# Patient Record
Sex: Female | Born: 1975 | Race: Black or African American | Hispanic: No | Marital: Single | Smoking: Current some day smoker
Health system: Southern US, Community
[De-identification: ages and names within clinical notes are randomized; demographics above are authoritative.]

## PROBLEM LIST (undated history)

## (undated) ENCOUNTER — Inpatient Hospital Stay (HOSPITAL_COMMUNITY): Payer: Self-pay

## (undated) DIAGNOSIS — K219 Gastro-esophageal reflux disease without esophagitis: Secondary | ICD-10-CM

## (undated) DIAGNOSIS — O139 Gestational [pregnancy-induced] hypertension without significant proteinuria, unspecified trimester: Secondary | ICD-10-CM

## (undated) DIAGNOSIS — I10 Essential (primary) hypertension: Secondary | ICD-10-CM

## (undated) DIAGNOSIS — M5430 Sciatica, unspecified side: Secondary | ICD-10-CM

## (undated) DIAGNOSIS — A599 Trichomoniasis, unspecified: Secondary | ICD-10-CM

## (undated) DIAGNOSIS — F419 Anxiety disorder, unspecified: Secondary | ICD-10-CM

## (undated) DIAGNOSIS — F1911 Other psychoactive substance abuse, in remission: Secondary | ICD-10-CM

---

## 1999-11-28 ENCOUNTER — Encounter: Payer: Self-pay | Admitting: *Deleted

## 1999-11-28 ENCOUNTER — Inpatient Hospital Stay (HOSPITAL_COMMUNITY): Admission: AD | Admit: 1999-11-28 | Discharge: 1999-11-28 | Payer: Self-pay | Admitting: *Deleted

## 1999-12-08 ENCOUNTER — Emergency Department (HOSPITAL_COMMUNITY): Admission: EM | Admit: 1999-12-08 | Discharge: 1999-12-08 | Payer: Self-pay | Admitting: Emergency Medicine

## 2000-01-17 ENCOUNTER — Inpatient Hospital Stay (HOSPITAL_COMMUNITY): Admission: AD | Admit: 2000-01-17 | Discharge: 2000-01-19 | Payer: Self-pay | Admitting: *Deleted

## 2000-05-02 ENCOUNTER — Emergency Department (HOSPITAL_COMMUNITY): Admission: EM | Admit: 2000-05-02 | Discharge: 2000-05-02 | Payer: Self-pay | Admitting: Emergency Medicine

## 2000-06-16 ENCOUNTER — Other Ambulatory Visit: Admission: RE | Admit: 2000-06-16 | Discharge: 2000-06-16 | Payer: Self-pay | Admitting: *Deleted

## 2000-06-17 ENCOUNTER — Emergency Department (HOSPITAL_COMMUNITY): Admission: EM | Admit: 2000-06-17 | Discharge: 2000-06-17 | Payer: Self-pay | Admitting: Emergency Medicine

## 2000-06-25 ENCOUNTER — Other Ambulatory Visit: Admission: RE | Admit: 2000-06-25 | Discharge: 2000-06-25 | Payer: Self-pay | Admitting: Obstetrics

## 2004-08-24 ENCOUNTER — Inpatient Hospital Stay (HOSPITAL_COMMUNITY): Admission: AD | Admit: 2004-08-24 | Discharge: 2004-08-24 | Payer: Self-pay | Admitting: Obstetrics and Gynecology

## 2004-08-26 ENCOUNTER — Inpatient Hospital Stay (HOSPITAL_COMMUNITY): Admission: AD | Admit: 2004-08-26 | Discharge: 2004-08-26 | Payer: Self-pay | Admitting: Obstetrics and Gynecology

## 2005-02-05 ENCOUNTER — Emergency Department (HOSPITAL_COMMUNITY): Admission: EM | Admit: 2005-02-05 | Discharge: 2005-02-05 | Payer: Self-pay | Admitting: Emergency Medicine

## 2006-02-05 ENCOUNTER — Emergency Department (HOSPITAL_COMMUNITY): Admission: EM | Admit: 2006-02-05 | Discharge: 2006-02-05 | Payer: Self-pay | Admitting: Emergency Medicine

## 2006-06-15 ENCOUNTER — Emergency Department (HOSPITAL_COMMUNITY): Admission: EM | Admit: 2006-06-15 | Discharge: 2006-06-15 | Payer: Self-pay | Admitting: Emergency Medicine

## 2006-06-21 ENCOUNTER — Emergency Department (HOSPITAL_COMMUNITY): Admission: EM | Admit: 2006-06-21 | Discharge: 2006-06-21 | Payer: Self-pay | Admitting: Emergency Medicine

## 2007-08-06 ENCOUNTER — Emergency Department (HOSPITAL_COMMUNITY): Admission: EM | Admit: 2007-08-06 | Discharge: 2007-08-06 | Payer: Self-pay | Admitting: Emergency Medicine

## 2007-12-30 ENCOUNTER — Emergency Department (HOSPITAL_COMMUNITY): Admission: EM | Admit: 2007-12-30 | Discharge: 2007-12-30 | Payer: Self-pay | Admitting: Emergency Medicine

## 2009-06-26 ENCOUNTER — Emergency Department (HOSPITAL_COMMUNITY): Admission: EM | Admit: 2009-06-26 | Discharge: 2009-06-26 | Payer: Self-pay | Admitting: Emergency Medicine

## 2010-08-25 LAB — URINALYSIS, ROUTINE W REFLEX MICROSCOPIC
Bilirubin Urine: NEGATIVE
Glucose, UA: NEGATIVE mg/dL
Hgb urine dipstick: NEGATIVE
Ketones, ur: NEGATIVE mg/dL
Nitrite: NEGATIVE
Protein, ur: NEGATIVE mg/dL
Specific Gravity, Urine: 1.02 (ref 1.005–1.030)
Urobilinogen, UA: 0.2 mg/dL (ref 0.0–1.0)
pH: 7 (ref 5.0–8.0)

## 2010-08-25 LAB — CBC
MCHC: 33.6 g/dL (ref 30.0–36.0)
MCV: 85.7 fL (ref 78.0–100.0)
Platelets: 176 10*3/uL (ref 150–400)
RBC: 3.98 MIL/uL (ref 3.87–5.11)
RDW: 15.9 % — ABNORMAL HIGH (ref 11.5–15.5)

## 2010-08-25 LAB — POCT I-STAT, CHEM 8
Glucose, Bld: 87 mg/dL (ref 70–99)
HCT: 36 % (ref 36.0–46.0)
Hemoglobin: 12.2 g/dL (ref 12.0–15.0)
Potassium: 4.2 mEq/L (ref 3.5–5.1)

## 2010-08-25 LAB — DIFFERENTIAL
Basophils Absolute: 0 10*3/uL (ref 0.0–0.1)
Basophils Relative: 1 % (ref 0–1)
Eosinophils Absolute: 0 10*3/uL (ref 0.0–0.7)
Monocytes Relative: 14 % — ABNORMAL HIGH (ref 3–12)
Neutrophils Relative %: 67 % (ref 43–77)

## 2011-02-28 LAB — GC/CHLAMYDIA PROBE AMP, GENITAL: Chlamydia, DNA Probe: NEGATIVE

## 2011-02-28 LAB — WET PREP, GENITAL: Clue Cells Wet Prep HPF POC: NONE SEEN

## 2011-06-18 ENCOUNTER — Encounter (HOSPITAL_COMMUNITY): Payer: Self-pay | Admitting: Emergency Medicine

## 2011-06-18 ENCOUNTER — Emergency Department (HOSPITAL_COMMUNITY)
Admission: EM | Admit: 2011-06-18 | Discharge: 2011-06-18 | Disposition: A | Discharge status: Home / Self Care | Payer: Self-pay | Attending: Emergency Medicine | Admitting: Emergency Medicine

## 2011-06-18 DIAGNOSIS — M545 Low back pain, unspecified: Secondary | ICD-10-CM | POA: Insufficient documentation

## 2011-06-18 DIAGNOSIS — M25559 Pain in unspecified hip: Secondary | ICD-10-CM | POA: Insufficient documentation

## 2011-06-18 DIAGNOSIS — X500XXA Overexertion from strenuous movement or load, initial encounter: Secondary | ICD-10-CM | POA: Insufficient documentation

## 2011-06-18 DIAGNOSIS — M25551 Pain in right hip: Secondary | ICD-10-CM

## 2011-06-18 MED ORDER — OXYCODONE-ACETAMINOPHEN 5-325 MG PO TABS: 2.0000 | ORAL_TABLET | ORAL | Status: DC | PRN

## 2011-06-18 MED ORDER — KETOROLAC TROMETHAMINE 60 MG/2ML IM SOLN
60.0000 mg | Freq: Once | INTRAMUSCULAR | Status: AC
  Administered 2011-06-18: 60 mg via INTRAMUSCULAR
  Filled 2011-06-18: qty 2

## 2011-06-18 MED ORDER — OXYCODONE-ACETAMINOPHEN 5-325 MG PO TABS
1.0000 | ORAL_TABLET | Freq: Once | ORAL | Status: AC
  Administered 2011-06-18: 1 via ORAL
  Filled 2011-06-18: qty 1

## 2011-06-18 MED ORDER — IBUPROFEN 800 MG PO TABS: 800.0000 mg | ORAL_TABLET | Freq: Three times a day (TID) | ORAL | Status: DC

## 2011-06-18 NOTE — ED Notes (Signed)
Pt st's she heard a pop in right hip 1 week ago. Since then has not been able to walk on it without severe pain.  St;s at times pain radiates from hip to chest giving her gas.

## 2011-06-18 NOTE — ED Provider Notes (Signed)
History     CSN: [id number]  Arrival date & time 06/18/11  1620   First MD Initiated Contact with Patient 06/18/11 1835      Chief Complaint  Patient presents with  . Hip Pain    (Consider location/radiation/quality/duration/timing/severity/associated sxs/prior treatment) HPI History provided by pt.   Pt was bending over to take laundry out of dryer 10 days ago when she felt/heard a pop in right hip.  Has had severe pain ever since that radiates toright low back and is aggravated by bearing weight and flexion/abduction of hip.  No associated fever, paresthesias or weakness.  No prior h/o hip/back injuries.  No PMH.  History reviewed. No pertinent past medical history.  History reviewed. No pertinent past surgical history.  No family history on file.  History  Substance Use Topics  . Smoking status: Current Everyday Smoker  . Smokeless tobacco: Not on file  . Alcohol Use: No    OB History    Grav Para Term Preterm Abortions TAB SAB Ect Mult Living                  Review of Systems  All other systems reviewed and are negative.    Allergies  Review of patient's allergies indicates no known allergies.  Home Medications  No current outpatient prescriptions on file.  BP 127/71  Pulse 70  Temp(Src) 98.3 F (36.8 C) (Oral)  Resp 18  SpO2 99%  LMP 06/16/2011  Physical Exam  Nursing note and vitals reviewed. Constitutional: She is oriented to person, place, and time. She appears well-developed and well-nourished. No distress.  HENT:  Head: Normocephalic and atraumatic.  Eyes:       Normal appearance  Neck: Normal range of motion.  Musculoskeletal:       Right hip w/out deformity or skin changes.  Tenderness w/ guarding to light palpation of proximal femur.  Pain w/ 30deg passive hip flexion.  Pain also aggravated by active abduction, particularly against resistance.  Distal NV intact.    Neurological: She is alert and oriented to person, place, and time.    Psychiatric: She has a normal mood and affect. Her behavior is normal.    ED Course  Procedures (including critical care time)   Labs Reviewed  POCT PREGNANCY, URINE   No results found.   1. Right hip pain       MDM  Pt presents w/ right hip pain x 1.5 weeks.  Doubt fracture/dislocation based on mechanism, location of pain and exam.  Pain may be secondary to bursitis, tendonitis or muscle strain. Pt received IM toradol and one percocet in ED w/ relief of pain.  She is sitting up in bed talking on phone.  She is able to ambulate.  D/c'd home w/ percocet and referral to ortho for persistent/worsening sx.  Also referred to [facility]. Return precautions including worsening pain and associated fever discussed.         Otilio Miu, [place] 06/18/11 2020

## 2011-06-19 NOTE — ED Provider Notes (Signed)
Medical screening examination/treatment/procedure(s) were performed by non-physician practitioner and as supervising physician I was immediately available for consultation/collaboration.   Celene Kras, MD 06/19/11 1323

## 2011-06-21 ENCOUNTER — Emergency Department (HOSPITAL_COMMUNITY)
Admission: EM | Admit: 2011-06-21 | Discharge: 2011-06-22 | Disposition: A | Discharge status: Home / Self Care | Payer: Self-pay | Attending: Emergency Medicine | Admitting: Emergency Medicine

## 2011-06-21 ENCOUNTER — Encounter (HOSPITAL_COMMUNITY): Payer: Self-pay | Admitting: *Deleted

## 2011-06-21 DIAGNOSIS — M545 Low back pain, unspecified: Secondary | ICD-10-CM | POA: Insufficient documentation

## 2011-06-21 DIAGNOSIS — F17200 Nicotine dependence, unspecified, uncomplicated: Secondary | ICD-10-CM | POA: Insufficient documentation

## 2011-06-21 DIAGNOSIS — M25559 Pain in unspecified hip: Secondary | ICD-10-CM | POA: Insufficient documentation

## 2011-06-21 DIAGNOSIS — Z87828 Personal history of other (healed) physical injury and trauma: Secondary | ICD-10-CM | POA: Insufficient documentation

## 2011-06-21 DIAGNOSIS — M5430 Sciatica, unspecified side: Secondary | ICD-10-CM | POA: Insufficient documentation

## 2011-06-21 DIAGNOSIS — M5431 Sciatica, right side: Secondary | ICD-10-CM

## 2011-06-21 NOTE — ED Notes (Signed)
The pt has had lower back and leg pain for 2 weeks.  She felt something pop while doing laundry.  She was seen here for the same thursday

## 2011-06-22 ENCOUNTER — Emergency Department (HOSPITAL_COMMUNITY): Payer: Self-pay

## 2011-06-22 MED ORDER — ONDANSETRON 4 MG PO TBDP
8.0000 mg | ORAL_TABLET | Freq: Once | ORAL | Status: AC
  Administered 2011-06-22: 8 mg via ORAL
  Filled 2011-06-22: qty 2

## 2011-06-22 MED ORDER — PREDNISONE 20 MG PO TABS: 40.0000 mg | ORAL_TABLET | Freq: Every day | ORAL | Status: AC

## 2011-06-22 MED ORDER — CYCLOBENZAPRINE HCL 10 MG PO TABS: 10.0000 mg | ORAL_TABLET | Freq: Three times a day (TID) | ORAL | Status: AC | PRN

## 2011-06-22 MED ORDER — DIAZEPAM 5 MG PO TABS
5.0000 mg | ORAL_TABLET | Freq: Once | ORAL | Status: AC
  Administered 2011-06-22: 5 mg via ORAL
  Filled 2011-06-22: qty 1

## 2011-06-22 MED ORDER — HYDROMORPHONE HCL PF 1 MG/ML IJ SOLN
1.0000 mg | Freq: Once | INTRAMUSCULAR | Status: AC
  Administered 2011-06-22: 1 mg via INTRAMUSCULAR
  Filled 2011-06-22: qty 1

## 2011-06-22 MED ORDER — PREDNISONE 20 MG PO TABS
60.0000 mg | ORAL_TABLET | Freq: Once | ORAL | Status: AC
  Administered 2011-06-22: 60 mg via ORAL
  Filled 2011-06-22: qty 3

## 2011-06-22 MED ORDER — PROMETHAZINE HCL 25 MG PO TABS: 25.0000 mg | ORAL_TABLET | Freq: Four times a day (QID) | ORAL | Status: DC | PRN

## 2011-06-22 NOTE — ED Notes (Signed)
Patient back from radiology.

## 2011-06-22 NOTE — ED Provider Notes (Signed)
Medical screening examination/treatment/procedure(s) were performed by non-physician practitioner and as supervising physician I was immediately available for consultation/collaboration.   Sameer Teeple, MD 06/22/11 2045 

## 2011-06-22 NOTE — ED Provider Notes (Signed)
History     CSN: [id number]  Arrival date & time 06/21/11  2145   First MD Initiated Contact with Patient 06/22/11 0002      Chief Complaint  Patient presents with  . Back Pain     Patient is a 36 y.o. female presenting with back pain. The history is provided by the patient.  Back Pain  This is a new problem. The current episode started more than 1 week ago. The problem occurs constantly. The problem has been gradually worsening. The pain is associated with twisting and a recent injury. The pain is present in the lumbar spine. The pain is at a severity of 10/10. The pain is severe. The symptoms are aggravated by bending, twisting and certain positions. The pain is the same all the time. Pertinent negatives include no numbness, no bowel incontinence, no perianal numbness, no bladder incontinence, no dysuria and no paresthesias.  Patient reports persistent low back pain since an injury approximately 10 days ago. Was doing laundry, bent over felt a pop in her lower back and has had pain since. Was seen here on 06/18/2011 but states she was not x-rayed, and believes that there is something wrong w/ her back. States the pain now radiates into her right hip and upper thigh. The pain radiating into her right hip and upper thigh is burning and sharp in nature.  Patient is tearful,  states that she did receive medication for pain on her visit on 06/18/2011 but has been unable to take it because it makes her nauseated. Pt entered the department using a cane.  History reviewed. No pertinent past medical history.  History reviewed. No pertinent past surgical history.  History reviewed. No pertinent family history.  History  Substance Use Topics  . Smoking status: Current Everyday Smoker  . Smokeless tobacco: Not on file  . Alcohol Use: No    OB History    Grav Para Term Preterm Abortions TAB SAB Ect Mult Living                  Review of Systems  Gastrointestinal: Negative for bowel  incontinence.  Genitourinary: Negative for bladder incontinence and dysuria.  Musculoskeletal: Positive for back pain.  Neurological: Negative for numbness and paresthesias.    Allergies  Review of patient's allergies indicates no known allergies.  Home Medications   Current Outpatient Rx  Name Route Sig Dispense Refill  . IBUPROFEN 800 MG PO TABS Oral Take 800 mg by mouth 3 (three) times daily.    Marland Kitchen OVER THE COUNTER MEDICATION Oral Take 2 tablets by mouth at bedtime. For pain/sleep   Equate pm pain reliever      BP 138/80  Pulse 70  Temp(Src) 98.6 F (37 C) (Oral)  Resp 18  SpO2 100%  LMP 06/16/2011  Physical Exam  Constitutional: She is oriented to person, place, and time. She appears well-developed and well-nourished.  HENT:  Head: Normocephalic and atraumatic.  Eyes: Conjunctivae are normal.  Neck: Neck supple.  Cardiovascular: Normal rate and regular rhythm.   Pulmonary/Chest: Effort normal and breath sounds normal.  Abdominal: Soft. Bowel sounds are normal.  Musculoskeletal: Normal range of motion.       Back:       Legs: Neurological: She is alert and oriented to person, place, and time. She has normal reflexes.  Skin: Skin is warm and dry. No erythema.  Psychiatric: She has a normal mood and affect.    ED Course  Procedures patient reports  significant relief with pain medications. Findings and clinical impression discussed with patient. We will plan for discharge home with treatment for sciatica, medication for nausea throughout her to take her pain medicine and encourage her to followup with the orthopedist with whom she was referred on January 9. Patient agreeable with plan.   Labs Reviewed  POCT PREGNANCY, URINE  POCT PREGNANCY, URINE   Dg Lumbar Spine Complete  06/22/2011  *RADIOLOGY REPORT*  Clinical Data: Right hip and back pain, unable to bear weight, injury 1.5 weeks ago sensation  LUMBAR SPINE - COMPLETE 4+ VIEW  Comparison: None  Findings: Osseous  mineralization normal. Five non-rib bearing lumbar vertebrae. Vertebral body and disc space heights maintained. No acute fracture, subluxation, or bone destruction. No spondylolysis. SI joints symmetric.  IMPRESSION: No acute osseous abnormalities.  Original Report Authenticated By: Lollie Marrow, M.D.   Dg Hip Complete Right  06/22/2011  *RADIOLOGY REPORT*  Clinical Data: Right hip and low back pain, injury 1.5 weeks ago, unable to bear weight  RIGHT HIP - COMPLETE 2+ VIEW  Comparison: None  Findings: Hip and SI joints symmetric and preserved. Osseous mineralization normal. Pelvis appears intact. No acute fracture, dislocation, or bone destruction. Questionable bone island left ilium.  IMPRESSION: No acute bony abnormalities.  Original Report Authenticated By: Lollie Marrow, M.D.     No diagnosis found.    MDM  HPI/PE and clinical findings/course most c/w sciatica s/p low back injury approx 10 days ago Imaging of lower back and right hip negative/no focal neurological findings.        Leanne Chang, NP 06/22/11 [phone]

## 2011-06-22 NOTE — ED Notes (Signed)
Called radiology for patient to be picked up.  Radiology came by earlier and patient in bathroom

## 2011-08-22 ENCOUNTER — Inpatient Hospital Stay (HOSPITAL_COMMUNITY)
Admission: AD | Admit: 2011-08-22 | Discharge: 2011-08-22 | Disposition: A | Discharge status: Home / Self Care | Payer: Medicaid Other | Source: Ambulatory Visit | Attending: Obstetrics & Gynecology | Admitting: Obstetrics & Gynecology

## 2011-08-22 ENCOUNTER — Encounter (HOSPITAL_COMMUNITY): Payer: Self-pay

## 2011-08-22 DIAGNOSIS — K219 Gastro-esophageal reflux disease without esophagitis: Secondary | ICD-10-CM | POA: Insufficient documentation

## 2011-08-22 DIAGNOSIS — A5901 Trichomonal vulvovaginitis: Secondary | ICD-10-CM | POA: Insufficient documentation

## 2011-08-22 DIAGNOSIS — R109 Unspecified abdominal pain: Secondary | ICD-10-CM | POA: Insufficient documentation

## 2011-08-22 DIAGNOSIS — A599 Trichomoniasis, unspecified: Secondary | ICD-10-CM

## 2011-08-22 DIAGNOSIS — O99891 Other specified diseases and conditions complicating pregnancy: Secondary | ICD-10-CM | POA: Insufficient documentation

## 2011-08-22 DIAGNOSIS — O98819 Other maternal infectious and parasitic diseases complicating pregnancy, unspecified trimester: Secondary | ICD-10-CM | POA: Insufficient documentation

## 2011-08-22 HISTORY — DX: Other psychoactive substance abuse, in remission: F19.11

## 2011-08-22 LAB — URINALYSIS, ROUTINE W REFLEX MICROSCOPIC
Leukocytes, UA: NEGATIVE
Protein, ur: NEGATIVE mg/dL
Urobilinogen, UA: 0.2 mg/dL (ref 0.0–1.0)

## 2011-08-22 LAB — POCT PREGNANCY, URINE: Preg Test, Ur: POSITIVE — AB

## 2011-08-22 MED ORDER — RANITIDINE HCL 150 MG PO CAPS: 150.0000 mg | ORAL_CAPSULE | Freq: Two times a day (BID) | ORAL | Status: DC

## 2011-08-22 MED ORDER — GI COCKTAIL ~~LOC~~
30.0000 mL | Freq: Once | ORAL | Status: AC
  Administered 2011-08-22: 30 mL via ORAL
  Filled 2011-08-22: qty 30

## 2011-08-22 MED ORDER — METRONIDAZOLE 500 MG PO TABS
2000.0000 mg | ORAL_TABLET | ORAL | Status: AC
  Administered 2011-08-22: 2000 mg via ORAL
  Filled 2011-08-22: qty 4

## 2011-08-22 NOTE — MAU Note (Signed)
Pt states lower abdominal cramping started last week. No vaginal bleeding. Complains of moderate amount thick white discharge, no odor. Intermittent chest tightness that feels like heartburn.

## 2011-08-22 NOTE — Discharge Instructions (Signed)
Trichomoniasis Trichomoniasis is an infection, caused by the Trichomonas organism, that affects both women and men. In women, the outer female genitalia and the vagina are affected. In men, the penis is mainly affected, but the prostate and other reproductive organs can also be involved. Trichomoniasis is a sexually transmitted disease (STD) and is most often passed to another person through sexual contact. The majority of people who get trichomoniasis do so from a sexual encounter and are also at risk for other STDs. CAUSES   Sexual intercourse with an infected partner.   It can be present in swimming pools or hot tubs.  SYMPTOMS   Abnormal gray-green frothy vaginal discharge in women.   Vaginal itching and irritation in women.   Itching and irritation of the area outside the vagina in women.   Penile discharge with or without pain in males.   Inflammation of the urethra (urethritis), causing painful urination.   Bleeding after sexual intercourse.  RELATED COMPLICATIONS  Pelvic inflammatory disease.   Infection of the uterus (endometritis).   Infertility.   Tubal (ectopic) pregnancy.   It can be associated with other STDs, including gonorrhea and chlamydia, hepatitis B, and HIV.  COMPLICATIONS DURING PREGNANCY  Early (premature) delivery.   Premature rupture of the membranes (PROM).   Low birth weight.  DIAGNOSIS   Visualization of Trichomonas under the microscope from the vagina discharge.   Ph of the vagina greater than 4.5, tested with a test tape.   Trich Rapid Test.   Culture of the organism, but this is not usually needed.   It may be found on a Pap test.   Having a "strawberry cervix,"which means the cervix looks very red like a strawberry.  TREATMENT   You may be given medication to fight the infection. Inform your caregiver if you could be or are pregnant. Some medications used to treat the infection should not be taken during pregnancy.    Over-the-counter medications or creams to decrease itching or irritation may be recommended.   Your sexual partner will need to be treated if infected.  HOME CARE INSTRUCTIONS   Take all medication prescribed by your caregiver.   Take over-the-counter medication for itching or irritation as directed by your caregiver.   Do not have sexual intercourse while you have the infection.   Do not douche or wear tampons.   Discuss your infection with your partner, as your partner may have acquired the infection from you. Or, your partner may have been the person who transmitted the infection to you.   Have your sex partner examined and treated if necessary.   Practice safe, informed, and protected sex.   See your caregiver for other STD testing.  SEEK MEDICAL CARE IF:   You still have symptoms after you finish the medication.   You have an oral temperature above 102 F (38.9 C).   You develop belly (abdominal) pain.   You have pain when you urinate.   You have bleeding after sexual intercourse.   You develop a rash.   The medication makes you sick or makes you throw up (vomit).  Document Released: 11/19/2000 Document Revised: 05/15/2011 Document Reviewed: 12/15/2008 [facility] Patient Information 2012 [place], [place].  Heartburn During Pregnancy  Heartburn happens when stomach acid goes up into the esophagus. The esophagus is the tube between the mouth and the stomach. This acid causes a burning pain in the chest or throat. This happens more often in the later part of pregnancy because the womb (uterus)  gets larger. It may also happen because of hormone changes. Heartburn problems often go away after giving birth. HOME CARE  Take all medicine as told by your doctor.   Raise the head of your bed with blocks only as told by your doctor.   Do not exercise right after eating.   Avoid eating 2 or 3 hours before bed. Do not lie down right after eating.   Eat small meals  throughout the day instead of 3 large meals.   Avoid foods that give you heartburn. Foods you may want to avoid include:   Peppers.   Chocolate.   High-fat foods, including fried foods.   Spicy foods.   Garlic and onions.   Citrus fruits, including oranges, grapefruit, lemons, and limes.   Food containing tomatoes or tomato products.   Mint.   Bubbly (carbonated) drinks and drinks with caffeine.   Vinegar.  GET HELP RIGHT AWAY IF:  You have bad chest pain that goes down your arm or into your jaw or neck.   You feel sweaty, dizzy, or lightheaded.   You have trouble breathing.   You throw up (vomit) blood.   You have trouble or pain when swallowing.   You have bloody or black poop (stool).   You have heartburn more than 3 times a week, for more than 2 weeks.  MAKE SURE YOU:  Understand these instructions.   Will watch your condition.   Will get help right away if you are not doing well or get worse.  Document Released: 06/28/2010 Document Revised: 05/15/2011 Document Reviewed: 10/13/2010 [facility] Patient Information 2012 [place], [place].  Pregnancy, First Trimester The first trimester is the first 3 months your baby is growing inside you. It is important to follow your doctor's instructions. HOME CARE   Do not smoke.   Do not drink alcohol.   Only take medicine as told by your doctor.   Exercise.   Eat healthy foods. Eat regular, well-balanced meals.   You can have sex (intercourse) if there are no other problems with the pregnancy.   Things that help with morning sickness:   Eat soda crackers before getting up in the morning.   Eat 4 to 5 small meals rather than 3 large meals.   Drink liquids between meals, not during meals.   Go to all appointments as told.   Take all vitamins or supplements as told by your doctor.  GET HELP RIGHT AWAY IF:   You develop a fever.   You have a bad smelling fluid that is leaking from your vagina.   There  is bleeding from the vagina.   You develop severe belly (abdominal) or back pain.   You throw up (vomit) blood. It may look like coffee grounds.   You lose more than 2 pounds in a week.   You gain 5 pounds or more in a week.   You gain more than 2 pounds in a week and you see puffiness (swelling) in your feet, ankles, or legs.   You have severe dizziness or pass out (faint).   You are around people who have [place] measles, chickenpox, or fifth disease.   You have a headache, watery poop (diarrhea), pain with peeing (urinating), or cannot breath right.  Document Released: 11/12/2007 Document Revised: 05/15/2011 Document Reviewed: 11/12/2007 [facility] Patient Information 2012 [place], [place].

## 2011-08-22 NOTE — MAU Provider Note (Signed)
Lindsey [name] [id number] @[redacted]w[redacted]d  upper abdominal pain  SUBJECTIVE  HPI:  Rt upper quadranat pain x 2 wks since pos HPT. Gagging but not vomiting. Better in certain positions.  States had STD check in Jan and neg, certain does not have and declines to be checked. Has 2 partners. Lots of heartburn, chest tightness and reflux, using baking soda with little effect. Also has headaches.   Past Medical History  Diagnosis Date  . H/O drug abuse     crack, clean for 3 months   Past Surgical History  Procedure Date  . No past surgeries    History   Social History  . Marital Status: Single    Spouse Name: N/A    Number of Children: N/A  . Years of Education: N/A   Occupational History  . Not on file.   Social History Main Topics  . Smoking status: Former Smoker    Types: Cigarettes  . Smokeless tobacco: Not on file  . Alcohol Use: No  . Drug Use: Yes    Special: "Crack" cocaine     clean for 3 months  . Sexually Active: Yes    Birth Control/ Protection: None   Other Topics Concern  . Not on file   Social History Narrative  . No narrative on file   No current facility-administered medications on file prior to encounter.   No current outpatient prescriptions on file prior to encounter.   No Known Allergies  ROS: Pertinent items in HPI  OBJECTIVE Blood pressure 123/69, pulse 69, temperature 98.5 F (36.9 C), temperature source Oral, resp. rate 18, height 5\' 3"  (1.6 m), weight 54.432 kg (120 lb), last menstrual period 06/19/2011. GENERAL: Well-developed, well-nourished female in no acute distress.  LUNGS: Clear to auscultation bilaterally.  HEART: Regular rate and rhythm. ABDOMEN: Soft, nontender EXTREMITIES: Nontender, no edema EXTERNAL GENITALIA: normal VAGINA: physiologic discharge CERVIX: long/closed/high    LAB RESULTS Results for orders placed during the hospital encounter of 08/22/11 (from the past 24 hour(s))  URINALYSIS, ROUTINE W REFLEX  MICROSCOPIC     Status: Abnormal   Collection Time   08/22/11  6:22 PM      Component Value Range   Color, Urine YELLOW  YELLOW    APPearance CLEAR  CLEAR    Specific Gravity, Urine 1.025  1.005 - 1.030    pH 6.0  5.0 - 8.0    Glucose, UA NEGATIVE  NEGATIVE (mg/dL)   Hgb urine dipstick TRACE (*) NEGATIVE    Bilirubin Urine NEGATIVE  NEGATIVE    Ketones, ur NEGATIVE  NEGATIVE (mg/dL)   Protein, ur NEGATIVE  NEGATIVE (mg/dL)   Urobilinogen, UA 0.2  0.0 - 1.0 (mg/dL)   Nitrite NEGATIVE  NEGATIVE    Leukocytes, UA NEGATIVE  NEGATIVE   URINE MICROSCOPIC-ADD ON     Status: Abnormal   Collection Time   08/22/11  6:22 PM      Component Value Range   Squamous Epithelial / LPF FEW (*) RARE    WBC, UA 3-6  <3 (WBC/hpf)   RBC / HPF 3-6  <3 (RBC/hpf)   Bacteria, UA FEW (*) RARE    Urine-Other TRICHOMONAS PRESENT    POCT PREGNANCY, URINE     Status: Abnormal   Collection Time   08/22/11  6:25 PM      Component Value Range   Preg Test, Ur POSITIVE (*) NEGATIVE     IMAGING Bedside US by me: pos FHR and movement   Care assumed  from Caren Griffins, CNM at 8:00 pm.  ASSESSMENT Acid reflux in pregnancy Trichomonas   PLAN In MAU: GI cocktail given with complete resolution of pt upper abdominal pain Flagyl 2 g x1 dose   D/C home Zantac 150 mg BID Tylenol for h/a Pregnancy verification letter given Instructions given on starting prenatal care at [facility] Return to MAU as needed  Sharen Counter, CNM

## 2011-08-22 NOTE — MAU Provider Note (Signed)
Lindsey [name] [id number] @9w1dNo  chief complaint on file.   SUBJECTIVE  HPI:  Rt upper quadranat pain x 2 wks since pos HPT. Gagging but not vomiting. Better in certain positions.  States had STD check in Jan and neg, certain does not have and declines to be checked. Has 2 partners. Lots of heartburn, chest tightness and reflux, using baking soda with little effect. Also has headaches.   Past Medical History  Diagnosis Date  . H/O drug abuse     crack, clean for 3 months   Past Surgical History  Procedure Date  . No past surgeries    History   Social History  . Marital Status: Single    Spouse Name: N/A    Number of Children: N/A  . Years of Education: N/A   Occupational History  . Not on file.   Social History Main Topics  . Smoking status: Former Smoker    Types: Cigarettes  . Smokeless tobacco: Not on file  . Alcohol Use: No  . Drug Use: Yes    Special: "Crack" cocaine     clean for 3 months  . Sexually Active: Yes    Birth Control/ Protection: None   Other Topics Concern  . Not on file   Social History Narrative  . No narrative on file   No current facility-administered medications on file prior to encounter.   No current outpatient prescriptions on file prior to encounter.   No Known Allergies  ROS: Pertinent items in HPI  OBJECTIVE Blood pressure 123/69, pulse 69, temperature 98.5 F (36.9 C), temperature source Oral, resp. rate 18, height 5\' 3"  (1.6 m), weight 54.432 kg (120 lb), last menstrual period 06/19/2011. GENERAL: Well-developed, well-nourished female in no acute distress.  LUNGS: Clear to auscultation bilaterally.  HEART: Regular rate and rhythm. ABDOMEN: Soft, nontender EXTREMITIES: Nontender, no edema EXTERNAL GENITALIA: normal VAGINA: physiologic discharge CERVIX: long/closed/high    LAB RESULTS   IMAGING Bedside US by me: pos FHR and movement  ASSESSMENT  Early IUP GERD  PLAN  Antacids, protonix, diet and  pregnancy precautions reviewed. F?U with provider of choice

## 2011-08-22 NOTE — MAU Provider Note (Signed)
Lindsey [name] [id number] @[redacted]w[redacted]d  by LMP with abd pain  SUBJECTIVE  HPI: 1 wk hx lower abd crampy pain; no vaginal bleeding. No dysuria, urgency, frequency, irritative discharge.  Past Medical History  Diagnosis Date  . H/O drug abuse     crack, clean for 3 months   Past Surgical History  Procedure Date  . No past surgeries    History   Social History  . Marital Status: Single    Spouse Name: N/A    Number of Children: N/A  . Years of Education: N/A   Occupational History  . Not on file.   Social History Main Topics  . Smoking status: Former Smoker    Types: Cigarettes  . Smokeless tobacco: Not on file  . Alcohol Use: No  . Drug Use: Yes    Special: "Crack" cocaine     clean for 3 months  . Sexually Active: Yes    Birth Control/ Protection: None   Other Topics Concern  . Not on file   Social History Narrative  . No narrative on file   No current facility-administered medications on file prior to encounter.   No current outpatient prescriptions on file prior to encounter.   No Known Allergies  ROS: Pertinent items in HPI  OBJECTIVE Blood pressure 123/69, pulse 69, temperature 98.5 F (36.9 C), temperature source Oral, resp. rate 18, height 5\' 3"  (1.6 m), weight 54.432 kg (120 lb), last menstrual period 06/19/2011. GENERAL: Well-developed, well-nourished female in no acute distress.  LUNGS: Clear to auscultation bilaterally.  HEART: Regular rate and rhythm. ABDOMEN: Soft, nontender EXTREMITIES: Nontender, no edema EXTERNAL GENITALIA: normal VAGINA: physiologic discharge CERVIX: long/closed/high  GI cocktail given with some relief Bedside US: FM and FHR noted  ASSESSMENT [id number] at 9weeks  GERD   PLAN Discussed diet, start antacids as directed. Advised to start [facility] with provider of choice.

## 2011-09-01 ENCOUNTER — Inpatient Hospital Stay (HOSPITAL_COMMUNITY)
Admission: AD | Admit: 2011-09-01 | Discharge: 2011-09-01 | Disposition: A | Discharge status: Home / Self Care | Payer: Medicaid Other | Source: Ambulatory Visit | Attending: Obstetrics & Gynecology | Admitting: Obstetrics & Gynecology

## 2011-09-01 ENCOUNTER — Encounter (HOSPITAL_COMMUNITY): Payer: Self-pay | Admitting: *Deleted

## 2011-09-01 DIAGNOSIS — K219 Gastro-esophageal reflux disease without esophagitis: Secondary | ICD-10-CM | POA: Insufficient documentation

## 2011-09-01 DIAGNOSIS — O99891 Other specified diseases and conditions complicating pregnancy: Secondary | ICD-10-CM | POA: Insufficient documentation

## 2011-09-01 DIAGNOSIS — R1013 Epigastric pain: Secondary | ICD-10-CM | POA: Insufficient documentation

## 2011-09-01 HISTORY — DX: Trichomoniasis, unspecified: A59.9

## 2011-09-01 LAB — URINALYSIS, ROUTINE W REFLEX MICROSCOPIC
Bilirubin Urine: NEGATIVE
Ketones, ur: NEGATIVE mg/dL
Protein, ur: NEGATIVE mg/dL
Urobilinogen, UA: 0.2 mg/dL (ref 0.0–1.0)

## 2011-09-01 MED ORDER — PANTOPRAZOLE SODIUM 40 MG PO TBEC
40.0000 mg | DELAYED_RELEASE_TABLET | Freq: Every day | ORAL | Status: DC
  Administered 2011-09-01: 40 mg via ORAL
  Filled 2011-09-01: qty 1

## 2011-09-01 MED ORDER — PANTOPRAZOLE SODIUM 40 MG PO TBEC: 40.0000 mg | DELAYED_RELEASE_TABLET | Freq: Every day | ORAL | Status: DC

## 2011-09-01 MED ORDER — ACETAMINOPHEN 500 MG PO TABS
1000.0000 mg | ORAL_TABLET | Freq: Once | ORAL | Status: AC
  Administered 2011-09-01: 1000 mg via ORAL
  Filled 2011-09-01: qty 2

## 2011-09-01 NOTE — MAU Provider Note (Signed)
Kamdyn Covel [id number] @[redacted]w[redacted]d  by LMP Chief Complaint  Patient presents with  . Abdominal Cramping   First Provider Initiated Contact with Patient 09/01/11 1209        SUBJECTIVE  HPI: She describes continuation of epigastric and right upper quadrant burning pain despite using TUMS every day. She has no lower abd pain or cramping. No nausea, vomiting. She's been seen here twice in the past for this and has received GI cocktail but does not like the numbing sensation. She was told to get Zantac but wants to know if that is safe in pregnancy. She denies food intolerances. Reflux symptoms are worse when she lies flat. She took the Flagyl for her Trichomonas infection. Has not yet gotten prenatal care.  Past Medical History  Diagnosis Date  . H/O drug abuse     crack, clean for 3 months  . Trichomonas    Past Surgical History  Procedure Date  . No past surgeries    History   Social History  . Marital Status: Single    Spouse Name: N/A    Number of Children: N/A  . Years of Education: N/A   Occupational History  . Not on file.   Social History Main Topics  . Smoking status: Current Some Day Smoker -- 0.2 packs/day    Types: Cigarettes  . Smokeless tobacco: Never Used  . Alcohol Use: No  . Drug Use: Yes    Special: "Crack" cocaine     clean for 3 months  . Sexually Active: Yes    Birth Control/ Protection: None   Other Topics Concern  . Not on file   Social History Narrative  . No narrative on file   No current facility-administered medications on file prior to encounter.   No current outpatient prescriptions on file prior to encounter.   No Known Allergies  ROS: Pertinent items in HPI  OBJECTIVE Blood pressure 121/65, pulse 72, temperature 97.8 F (36.6 C), temperature source Oral, resp. rate 18, height 5\' 3"  (1.6 m), weight 54.432 kg (120 lb), last menstrual period 06/19/2011. GENERAL: Well-developed, well-nourished female in no acute distress.    LUNGS: Clear to auscultation bilaterally.  HEART: Regular rate and rhythm. ABDOMEN: Soft, NT, neg Murphy's sign EXTREMITIES: Nontender, no edema  DT FHR 166  MAU COURSE:   Protonix 40 mg tab given with some relief.   ASSESSMENT [id number] at 104d GERD   PLAN Start Protonix. Again reviewed antacid use and diet. List of [facility] providers given. Rx PNVs.

## 2011-09-01 NOTE — Discharge Instructions (Signed)
Diet for Gastroesophageal Reflux Disease, Child Some children have small, brief episodes of reflux. Reflux (acid reflux) is when acid from your stomach flows up into the esophagus. When acid comes in contact with the esophagus, the acid causes irritation and soreness (inflammation) in the esophagus. The reflux may be so small that a child may not notice it. When reflux happens often or so severely that it causes damage to the esophagus, it is called gastroesophageal reflux disease (GERD). Nutrition therapy can help ease the discomfort of GERD.  FOODS TO AVOID   Caffeinated and decaffeinated coffee and black tea.   Regular or low-calorie carbonated beverages or energy drinks (caffeine-free carbonated beverages are allowed).   Strong spices, such as black pepper, white pepper, red pepper, cayenne, curry powder, and chili powder.   Peppermint or spearmint.   Chocolate.   High-fat foods, including meats and fried foods. Extra added fats including oils, butter, salad dressings, and nuts. Low-fat foods may not be recommended for children less than 2 years of age. Discuss this with your doctor or dietitian.   Fruits and vegetables that are not tolerated, such as citrus fruitsand tomatoes.   Any food that seems to aggravate the child's condition.  If you have questions regarding your child's diet, call your caregiver or a registered dietician. OTHER THINGS THAT MAY HELP GERD INCLUDE:  Having the child eat his or her meals slowly, in a relaxed setting.   Serving several small meals throughout the day instead of 3 large meals.   Eliminating food for a period of time if it causes distress.   Not letting the child lie down immediately after eating a meal.   Keeping the head of the child's bed raised 6 to 9 inches (15 to 23 cm) by using a foam wedge or blocks under the legs of the bed.   Encouraging the child to be physically active. Weight loss may be helpful in reducing reflux in overweight or  obese children.   Having the child wear loose-fitting clothing.   Avoiding the use of tobacco in parents and caregivers. Secondhand smoke may aggravate symptoms in children with reflux.  SAMPLE MEAL PLAN This is a sample meal plan for a 4 to 8 year old child and is approximately 1200 calories based on ChooseMyPlate.gov meal planning guidelines.  Breakfast   cup cooked oatmeal.    cup strawberries.    cup low-fat milk.  Snack   cup cucumber slices.   4 oz yogurt (made from low-fat milk).  Lunch  1 slice whole-wheat bread.   1 oz chicken.    cup blueberries.    cup snap peas.  Snack  3 whole-wheat crackers.   1 oz string cheese.  Dinner   cup brown rice.    cup mixed veggies.   1 cup low-fat milk.   2 oz grilled fish.  Document Released: 10/12/2006 Document Revised: 05/15/2011 Document Reviewed: 04/17/2011 ExitCare Patient Information 2012 [employer].Gastroesophageal Reflux Disease, Adult Gastroesophageal reflux disease (GERD) happens when acid from your stomach flows up into the esophagus. When acid comes in contact with the esophagus, the acid causes soreness (inflammation) in the esophagus. Over time, GERD may create small holes (ulcers) in the lining of the esophagus. CAUSES   Increased body weight. This puts pressure on the stomach, making acid rise from the stomach into the esophagus.   Smoking. This increases acid production in the stomach.   Drinking alcohol. This causes decreased pressure in the lower esophageal sphincter (valve or   ring of muscle between the esophagus and stomach), allowing acid from the stomach into the esophagus.   Late evening meals and a full stomach. This increases pressure and acid production in the stomach.   A malformed lower esophageal sphincter.  Sometimes, no cause is found. SYMPTOMS   Burning pain in the lower part of the mid-chest behind the breastbone and in the mid-stomach area. This may occur twice a week  or more often.   Trouble swallowing.   Sore throat.   Dry cough.   Asthma-like symptoms including chest tightness, shortness of breath, or wheezing.  DIAGNOSIS  Your caregiver may be able to diagnose GERD based on your symptoms. In some cases, X-rays and other tests may be done to check for complications or to check the condition of your stomach and esophagus. TREATMENT  Your caregiver may recommend over-the-counter or prescription medicines to help decrease acid production. Ask your caregiver before starting or adding any new medicines.  HOME CARE INSTRUCTIONS   Change the factors that you can control. Ask your caregiver for guidance concerning weight loss, quitting smoking, and alcohol consumption.   Avoid foods and drinks that make your symptoms worse, such as:   Caffeine or alcoholic drinks.   Chocolate.   Peppermint or mint flavorings.   Garlic and onions.   Spicy foods.   Citrus fruits, such as oranges, lemons, or limes.   Tomato-based foods such as sauce, chili, salsa, and pizza.   Fried and fatty foods.   Avoid lying down for the 3 hours prior to your bedtime or prior to taking a nap.   Eat small, frequent meals instead of large meals.   Wear loose-fitting clothing. Do not wear anything tight around your waist that causes pressure on your stomach.   Raise the head of your bed 6 to 8 inches with wood blocks to help you sleep. Extra pillows will not help.   Only take over-the-counter or prescription medicines for pain, discomfort, or fever as directed by your caregiver.   Do not take aspirin, ibuprofen, or other nonsteroidal anti-inflammatory drugs (NSAIDs).  SEEK IMMEDIATE MEDICAL CARE IF:   You have pain in your arms, neck, jaw, teeth, or back.   Your pain increases or changes in intensity or duration.   You develop nausea, vomiting, or sweating (diaphoresis).   You develop shortness of breath, or you faint.   Your vomit is green, yellow, black, or  looks like coffee grounds or blood.   Your stool is red, bloody, or black.  These symptoms could be signs of other problems, such as heart disease, gastric bleeding, or esophageal bleeding. MAKE SURE YOU:   Understand these instructions.   Will watch your condition.   Will get help right away if you are not doing well or get worse.  Document Released: 03/05/2005 Document Revised: 05/15/2011 Document Reviewed: 12/13/2010 ExitCare Patient Information 2012 [employer]. 

## 2011-09-01 NOTE — MAU Provider Note (Signed)
Medical Screening exam and patient care preformed by advanced practice provider.  Agree with the above management.  

## 2011-09-01 NOTE — MAU Note (Signed)
States was here 2 weeks ago and was treated for trichomonas, was given 4 pills. Has had unprotected sex since with same partner. Partner has not been treated. States she still feels "irritated down there when I have sex." Denies burning, itching or discharge. C/O constant abdominal cramping. C/O "bad heartburn when I eat certain amounts of food."

## 2011-10-15 LAB — OB RESULTS CONSOLE ANTIBODY SCREEN: Antibody Screen: NEGATIVE

## 2011-10-15 LAB — OB RESULTS CONSOLE HIV ANTIBODY (ROUTINE TESTING): HIV: NONREACTIVE

## 2011-10-24 ENCOUNTER — Other Ambulatory Visit (HOSPITAL_COMMUNITY): Payer: Self-pay | Admitting: Obstetrics and Gynecology

## 2011-10-24 DIAGNOSIS — K219 Gastro-esophageal reflux disease without esophagitis: Secondary | ICD-10-CM

## 2011-10-27 ENCOUNTER — Encounter (HOSPITAL_COMMUNITY): Payer: Self-pay

## 2011-10-27 ENCOUNTER — Ambulatory Visit (HOSPITAL_COMMUNITY): Payer: Medicaid Other

## 2011-10-27 ENCOUNTER — Inpatient Hospital Stay (HOSPITAL_COMMUNITY)
Admission: AD | Admit: 2011-10-27 | Discharge: 2011-10-27 | Discharge status: Against Medical Advice | Payer: Medicaid Other | Source: Ambulatory Visit | Attending: Obstetrics & Gynecology | Admitting: Obstetrics & Gynecology

## 2011-10-27 DIAGNOSIS — O99891 Other specified diseases and conditions complicating pregnancy: Secondary | ICD-10-CM | POA: Insufficient documentation

## 2011-10-27 DIAGNOSIS — IMO0002 Reserved for concepts with insufficient information to code with codable children: Secondary | ICD-10-CM | POA: Insufficient documentation

## 2011-10-27 DIAGNOSIS — M549 Dorsalgia, unspecified: Secondary | ICD-10-CM | POA: Insufficient documentation

## 2011-10-27 DIAGNOSIS — R109 Unspecified abdominal pain: Secondary | ICD-10-CM | POA: Insufficient documentation

## 2011-10-27 MED ORDER — OXYCODONE-ACETAMINOPHEN 5-325 MG PO TABS
2.0000 | ORAL_TABLET | ORAL | Status: AC
  Administered 2011-10-27: 2 via ORAL
  Filled 2011-10-27: qty 2

## 2011-10-27 MED ORDER — OXYCODONE-ACETAMINOPHEN 5-325 MG PO TABS: 2.0000 | ORAL_TABLET | Freq: Once | ORAL | Status: DC
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
## 2011-10-27 NOTE — Telephone Encounter (Signed)
Protonix refill approved. Please let pt know.

## 2011-10-27 NOTE — MAU Note (Signed)
TSlade, CSW paged, also left message to be called back.
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
## 2011-10-27 NOTE — MAU Note (Signed)
Patient is not in the lobby when called to triage.  
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
## 2011-10-27 NOTE — MAU Note (Signed)
Pt given menu, food tray ordered, phone placed in room.

## 2011-10-28 LAB — ABO/RH: ABO/RH(D): AB POS

## 2011-10-28 NOTE — Telephone Encounter (Signed)
Called pt @ home tel# and was told that she was not there and this person did not know when she would return. There was no other tel# for her.

## 2011-12-27 ENCOUNTER — Other Ambulatory Visit (HOSPITAL_COMMUNITY): Payer: Self-pay | Admitting: Obstetrics and Gynecology

## 2012-01-04 ENCOUNTER — Encounter (HOSPITAL_COMMUNITY): Payer: Self-pay | Admitting: Emergency Medicine

## 2012-01-04 ENCOUNTER — Emergency Department (HOSPITAL_COMMUNITY)
Admission: EM | Admit: 2012-01-04 | Discharge: 2012-01-05 | Disposition: A | Discharge status: Home / Self Care | Payer: Medicaid Other | Attending: Emergency Medicine | Admitting: Emergency Medicine

## 2012-01-04 DIAGNOSIS — O99330 Smoking (tobacco) complicating pregnancy, unspecified trimester: Secondary | ICD-10-CM | POA: Insufficient documentation

## 2012-01-04 DIAGNOSIS — K219 Gastro-esophageal reflux disease without esophagitis: Secondary | ICD-10-CM | POA: Insufficient documentation

## 2012-01-04 DIAGNOSIS — O99891 Other specified diseases and conditions complicating pregnancy: Secondary | ICD-10-CM | POA: Insufficient documentation

## 2012-01-04 DIAGNOSIS — R12 Heartburn: Secondary | ICD-10-CM | POA: Insufficient documentation

## 2012-01-04 NOTE — ED Notes (Signed)
Pt sts she has not had her protonix for 1 week and ate something she shouldn't have tonight. Patient sts pain and burning in her epigastric area. Has not taken anything OTC for the pain.

## 2012-01-04 NOTE — ED Notes (Signed)
Pt was giving three apple juice and a [place] sandwich and still complaining about heartburn

## 2012-01-04 NOTE — ED Notes (Signed)
Pt alert, nad, arrives from home, c/o "heartburn" and indigestion, onset was yesterday, states no refill of home medications, resp even unlabored, skin pwd

## 2012-01-05 MED ORDER — DOCUSATE SODIUM 100 MG PO CAPS: 100.0000 mg | ORAL_CAPSULE | Freq: Two times a day (BID) | ORAL | Status: DC | PRN

## 2012-01-05 MED ORDER — PRENATAL MULTIVITAMIN CH: 1.0000 | ORAL_TABLET | Freq: Every day | ORAL | Status: DC

## 2012-01-05 MED ORDER — ALUM & MAG HYDROXIDE-SIMETH 200-200-20 MG/5ML PO SUSP
30.0000 mL | Freq: Once | ORAL | Status: AC
  Administered 2012-01-05: 30 mL via ORAL
  Filled 2012-01-05: qty 30

## 2012-01-05 MED ORDER — PANTOPRAZOLE SODIUM 40 MG PO TBEC: 40.0000 mg | DELAYED_RELEASE_TABLET | Freq: Every day | ORAL | Status: DC

## 2012-01-05 MED ORDER — FAMOTIDINE 20 MG PO TABS
20.0000 mg | ORAL_TABLET | Freq: Once | ORAL | Status: AC
  Administered 2012-01-05: 20 mg via ORAL
  Filled 2012-01-05: qty 1

## 2012-01-05 NOTE — ED Provider Notes (Signed)
History     CSN: [id number]  Arrival date & time 01/04/12  2136   First MD Initiated Contact with Patient 01/04/12 2355      Chief Complaint  Patient presents with  . Heartburn     HPI The patient presents to the emergency room with complaints of heartburn and indigestion. The symptoms started yesterday after she ate something. She normally takes antacids but ran out of them about a week ago and has not had any medications at home. She denies any chest pain or shortness of breath. She denies any abdominal pain, vaginal bleeding. The patient is 7 months pregnant and states she's not having any issues. The baby has been active and moving. She has not had any vaginal discharge.   Past Medical History  Diagnosis Date  . H/O drug abuse     crack, clean for 3 months  . Trichomonas     Past Surgical History  Procedure Date  . No past surgeries     Family History  Problem Relation Age of Onset  . Anesthesia problems Neg Hx   . Hypotension Neg Hx   . Malignant hyperthermia Neg Hx   . Pseudochol deficiency Neg Hx     History  Substance Use Topics  . Smoking status: Current Everyday Smoker -- 0.0 packs/day    Types: Cigarettes  . Smokeless tobacco: Never Used  . Alcohol Use: No    OB History    Grav Para Term Preterm Abortions TAB SAB Ect Mult Living   4 1 1  0 2 0 2 0 0 1      Review of Systems  All other systems reviewed and are negative.    Allergies  Review of patient's allergies indicates no known allergies.  Home Medications   Current Outpatient Rx  Name Route Sig Dispense Refill  . CALCIUM CARBONATE ANTACID 500 MG PO CHEW Oral Chew 4-5 tablets by mouth daily as needed. For heartburn    . DOCUSATE SODIUM 100 MG PO CAPS Oral Take 1 capsule (100 mg total) by mouth 2 (two) times daily as needed. Stool softener 30 capsule 1  . PANTOPRAZOLE SODIUM 40 MG PO TBEC Oral Take 1 tablet (40 mg total) by mouth daily. 30 tablet 1  . PRENATAL MULTIVITAMIN CH Oral Take 1  tablet by mouth daily. 30 tablet 1    BP 121/72  Pulse 69  Temp 97.9 F (36.6 C) (Oral)  Resp 24  Ht 5\' 3"  (1.6 m)  Wt 130 lb (58.968 kg)  BMI 23.03 kg/m2  SpO2 100%  LMP 06/19/2011  Physical Exam  Nursing note and vitals reviewed. Constitutional: She appears well-developed and well-nourished. No distress.  HENT:  Head: Normocephalic and atraumatic.  Right Ear: External ear normal.  Left Ear: External ear normal.  Eyes: Conjunctivae are normal. Right eye exhibits no discharge. Left eye exhibits no discharge. No scleral icterus.  Neck: Neck supple. No tracheal deviation present.  Cardiovascular: Normal rate, regular rhythm and intact distal pulses.   Pulmonary/Chest: Effort normal and breath sounds normal. No stridor. No respiratory distress. She has no wheezes. She has no rales.  Abdominal: Soft. Bowel sounds are normal. She exhibits no distension. There is no tenderness. There is no rebound and no guarding.       Gravid uterus  Musculoskeletal: She exhibits no edema and no tenderness.  Neurological: She is alert. She has normal strength. No sensory deficit. Cranial nerve deficit:  no gross defecits noted. She exhibits normal muscle tone.  She displays no seizure activity. Coordination normal.  Skin: Skin is warm and dry. No rash noted.  Psychiatric: She has a normal mood and affect.    ED Course  Procedures (including critical care time)  Labs Reviewed - No data to display No results found.   1. Heartburn in pregnancy   2. GERD (gastroesophageal reflux disease)       MDM  Patient was given antacids. I prescribed refills of her medications she has been taking. She is encouraged to followup at the [facility] hospital for any worsening symptoms. Patient has been tolerating by mouth fluids here and is not in any distress.  At this time there does not appear to be any evidence of an acute emergency medical condition and the patient appears stable for discharge with appropriate  outpatient follow up.        Celene Kras, MD 01/05/12 [id number]

## 2012-01-08 ENCOUNTER — Inpatient Hospital Stay (HOSPITAL_COMMUNITY)
Admission: AD | Admit: 2012-01-08 | Discharge: 2012-01-08 | Disposition: A | Discharge status: Home / Self Care | Payer: Medicaid Other | Source: Ambulatory Visit | Attending: Obstetrics & Gynecology | Admitting: Obstetrics & Gynecology

## 2012-01-08 ENCOUNTER — Encounter (HOSPITAL_COMMUNITY): Payer: Self-pay

## 2012-01-08 DIAGNOSIS — O99891 Other specified diseases and conditions complicating pregnancy: Secondary | ICD-10-CM | POA: Insufficient documentation

## 2012-01-08 DIAGNOSIS — N949 Unspecified condition associated with female genital organs and menstrual cycle: Secondary | ICD-10-CM

## 2012-01-08 DIAGNOSIS — R109 Unspecified abdominal pain: Secondary | ICD-10-CM | POA: Insufficient documentation

## 2012-01-08 HISTORY — DX: Gastro-esophageal reflux disease without esophagitis: K21.9

## 2012-01-08 LAB — URINALYSIS, ROUTINE W REFLEX MICROSCOPIC
Glucose, UA: NEGATIVE mg/dL
Leukocytes, UA: NEGATIVE
Nitrite: NEGATIVE
Specific Gravity, Urine: 1.02 (ref 1.005–1.030)
pH: 7 (ref 5.0–8.0)

## 2012-01-08 MED ORDER — ACETAMINOPHEN 500 MG PO TABS
1000.0000 mg | ORAL_TABLET | Freq: Four times a day (QID) | ORAL | Status: DC | PRN
  Administered 2012-01-08: 1000 mg via ORAL
  Filled 2012-01-08: qty 2

## 2012-01-08 NOTE — MAU Note (Signed)
Patient is brought in by ems with c/o intense pelvic pressure and abdominal pain that is intense with movement. She reports good fetal movement, denies vaginal bleeding or lof. She states that she have no complication with this pregnancy.

## 2012-01-08 NOTE — MAU Provider Note (Signed)
Lindsey [name] is a 36 y.o. female presenting for eval of low abd pain and vaginal pain. Denies leak or bldg. Denies recent drug use; no constipation. She is a pt of [facility] and has an appt scheduled for later today. History OB History    Grav Para Term Preterm Abortions TAB SAB Ect Mult Living   4 1 1  0 2 0 2 0 0 1     Past Medical History  Diagnosis Date  . H/O drug abuse     crack, clean for 3 months  . Trichomonas   . GERD (gastroesophageal reflux disease)    Past Surgical History  Procedure Date  . No past surgeries    Family History: family history is negative for Anesthesia problems, and Hypotension, and Malignant hyperthermia, and Pseudochol deficiency, . Social History:  reports that she has been smoking Cigarettes.  She has been smoking about .01 packs per day. She has never used smokeless tobacco. She reports that she uses illicit drugs ("Crack" cocaine). She reports that she does not drink alcohol.    ROS    Blood pressure 118/65, pulse 73, temperature 97.8 F (36.6 C), temperature source Oral, resp. rate 20, last menstrual period 06/19/2011. Maternal Exam:  Uterine Assessment: Rare irritability per toco  Cervix: cx C/L  Fetal Exam Fetal Monitor Review: Baseline rate: 135.  Variability: moderate (6-25 bpm).   Pattern: accelerations present and no decelerations.       Physical Exam  Constitutional: She is oriented to person, place, and time. She appears well-developed.  HENT:  Head: Normocephalic.  Cardiovascular: Normal rate.   Respiratory: Effort normal.  GI:       Fundus soft; low abd sl more firm but softer than contractions  Genitourinary: Vagina normal.  Musculoskeletal: Normal range of motion.  Neurological: She is alert and oriented to person, place, and time.  Skin: Skin is warm and dry.  Psychiatric: She has a normal mood and affect. Her behavior is normal.    Prenatal labs: ABO, Rh: --/--/AB POS (05/20 1230) Antibody:   Rubella:     RPR:    HBsAg:    HIV:    GBS:     Assessment/Plan: IUP at 29.0wks Round lig pain/vag pain  D/C home with comfort meas rev'd; given 1gm Tylenol here F/U as sched with [facility] later today   SHAW, KIMBERLY 01/08/2012, 12:55 AM

## 2012-02-29 LAB — OB RESULTS CONSOLE GBS: GBS: NEGATIVE

## 2012-03-19 ENCOUNTER — Encounter (HOSPITAL_COMMUNITY): Payer: Self-pay | Admitting: Anesthesiology

## 2012-03-19 ENCOUNTER — Encounter (HOSPITAL_COMMUNITY): Payer: Self-pay | Admitting: *Deleted

## 2012-03-19 ENCOUNTER — Inpatient Hospital Stay (HOSPITAL_COMMUNITY)
Admission: AD | Admit: 2012-03-19 | Discharge: 2012-03-22 | DRG: 766 | Disposition: A | Discharge status: Home / Self Care | Payer: Medicaid Other | Source: Ambulatory Visit | Attending: Obstetrics | Admitting: Obstetrics

## 2012-03-19 ENCOUNTER — Inpatient Hospital Stay (HOSPITAL_COMMUNITY): Payer: Medicaid Other | Admitting: Anesthesiology

## 2012-03-19 ENCOUNTER — Encounter (HOSPITAL_COMMUNITY)
Admission: AD | Disposition: A | Discharge status: Home / Self Care | Payer: Self-pay | Source: Ambulatory Visit | Attending: Obstetrics

## 2012-03-19 DIAGNOSIS — F1410 Cocaine abuse, uncomplicated: Secondary | ICD-10-CM | POA: Diagnosis present

## 2012-03-19 DIAGNOSIS — Z98891 History of uterine scar from previous surgery: Secondary | ICD-10-CM

## 2012-03-19 DIAGNOSIS — O99344 Other mental disorders complicating childbirth: Secondary | ICD-10-CM | POA: Diagnosis present

## 2012-03-19 DIAGNOSIS — O368390 Maternal care for abnormalities of the fetal heart rate or rhythm, unspecified trimester, not applicable or unspecified: Secondary | ICD-10-CM

## 2012-03-19 DIAGNOSIS — O09529 Supervision of elderly multigravida, unspecified trimester: Secondary | ICD-10-CM | POA: Diagnosis present

## 2012-03-19 LAB — CBC
MCHC: 33.6 g/dL (ref 30.0–36.0)
RDW: 13.4 % (ref 11.5–15.5)

## 2012-03-19 LAB — RAPID URINE DRUG SCREEN, HOSP PERFORMED
Amphetamines: NOT DETECTED
Opiates: NOT DETECTED

## 2012-03-19 LAB — RPR: RPR Ser Ql: NONREACTIVE

## 2012-03-19 SURGERY — Surgical Case
Anesthesia: Epidural | Site: Abdomen | Wound class: Clean Contaminated

## 2012-03-19 MED ORDER — SCOPOLAMINE 1 MG/3DAYS TD PT72
1.0000 | MEDICATED_PATCH | Freq: Once | TRANSDERMAL | Status: DC
  Administered 2012-03-19: 1.5 mg via TRANSDERMAL

## 2012-03-19 MED ORDER — PROMETHAZINE HCL 25 MG/ML IJ SOLN
25.0000 mg | Freq: Four times a day (QID) | INTRAMUSCULAR | Status: DC | PRN
  Administered 2012-03-19: 25 mg via INTRAMUSCULAR
  Filled 2012-03-19: qty 1

## 2012-03-19 MED ORDER — DIPHENHYDRAMINE HCL 50 MG/ML IJ SOLN: 25.0000 mg | INTRAMUSCULAR | Status: DC | PRN

## 2012-03-19 MED ORDER — OXYCODONE-ACETAMINOPHEN 5-325 MG PO TABS: 1.0000 | ORAL_TABLET | ORAL | Status: DC | PRN

## 2012-03-19 MED ORDER — NALOXONE HCL 0.4 MG/ML IJ SOLN
1.0000 ug/kg/h | INTRAMUSCULAR | Status: DC | PRN
  Filled 2012-03-19: qty 2.5

## 2012-03-19 MED ORDER — SCOPOLAMINE 1 MG/3DAYS TD PT72
MEDICATED_PATCH | TRANSDERMAL | Status: AC
  Administered 2012-03-19: 1.5 mg via TRANSDERMAL
  Filled 2012-03-19: qty 1

## 2012-03-19 MED ORDER — MORPHINE SULFATE (PF) 0.5 MG/ML IJ SOLN
INTRAMUSCULAR | Status: DC | PRN
  Administered 2012-03-19: 1 mg via EPIDURAL

## 2012-03-19 MED ORDER — ACETAMINOPHEN 325 MG PO TABS: 650.0000 mg | ORAL_TABLET | ORAL | Status: DC | PRN

## 2012-03-19 MED ORDER — KETOROLAC TROMETHAMINE 30 MG/ML IJ SOLN
30.0000 mg | Freq: Four times a day (QID) | INTRAMUSCULAR | Status: AC | PRN
  Administered 2012-03-19: 30 mg via INTRAVENOUS
  Filled 2012-03-19: qty 1

## 2012-03-19 MED ORDER — FLEET ENEMA 7-19 GM/118ML RE ENEM: 1.0000 | ENEMA | RECTAL | Status: DC | PRN

## 2012-03-19 MED ORDER — KETOROLAC TROMETHAMINE 30 MG/ML IJ SOLN: 30.0000 mg | Freq: Four times a day (QID) | INTRAMUSCULAR | Status: AC | PRN

## 2012-03-19 MED ORDER — ATROPINE SULFATE 0.4 MG/ML IJ SOLN: 0.0100 mg | INTRAMUSCULAR | Status: DC | PRN

## 2012-03-19 MED ORDER — PRENATAL MULTIVITAMIN CH
1.0000 | ORAL_TABLET | Freq: Every day | ORAL | Status: DC
  Administered 2012-03-20 – 2012-03-22 (×3): 1 via ORAL
  Filled 2012-03-19 (×2): qty 1

## 2012-03-19 MED ORDER — LACTATED RINGERS IV SOLN
INTRAVENOUS | Status: DC
  Administered 2012-03-19 (×2): via INTRAUTERINE

## 2012-03-19 MED ORDER — DIBUCAINE 1 % RE OINT: 1.0000 "application " | TOPICAL_OINTMENT | RECTAL | Status: DC | PRN

## 2012-03-19 MED ORDER — PHENYLEPHRINE 40 MCG/ML (10ML) SYRINGE FOR IV PUSH (FOR BLOOD PRESSURE SUPPORT)
80.0000 ug | PREFILLED_SYRINGE | INTRAVENOUS | Status: DC | PRN
  Filled 2012-03-19: qty 2
  Filled 2012-03-19: qty 5

## 2012-03-19 MED ORDER — MORPHINE SULFATE (PF) 0.5 MG/ML IJ SOLN
INTRAMUSCULAR | Status: DC | PRN
  Administered 2012-03-19: 4 mg via EPIDURAL

## 2012-03-19 MED ORDER — SIMETHICONE 80 MG PO CHEW
80.0000 mg | CHEWABLE_TABLET | ORAL | Status: DC | PRN
  Administered 2012-03-20 – 2012-03-21 (×3): 80 mg via ORAL

## 2012-03-19 MED ORDER — MEDROXYPROGESTERONE ACETATE 150 MG/ML IM SUSP: 150.0000 mg | INTRAMUSCULAR | Status: DC | PRN

## 2012-03-19 MED ORDER — LACTATED RINGERS IV SOLN: 500.0000 mL | INTRAVENOUS | Status: DC | PRN

## 2012-03-19 MED ORDER — OXYCODONE-ACETAMINOPHEN 5-325 MG PO TABS
1.0000 | ORAL_TABLET | ORAL | Status: DC | PRN
  Administered 2012-03-20: 1 via ORAL
  Administered 2012-03-20: 2 via ORAL
  Administered 2012-03-20: 1 via ORAL
  Administered 2012-03-20 – 2012-03-21 (×3): 2 via ORAL
  Administered 2012-03-21 – 2012-03-22 (×3): 1 via ORAL
  Filled 2012-03-19 (×2): qty 2
  Filled 2012-03-19 (×3): qty 1
  Filled 2012-03-19 (×3): qty 2
  Filled 2012-03-19 (×2): qty 1

## 2012-03-19 MED ORDER — LACTATED RINGERS IV SOLN
INTRAVENOUS | Status: DC
  Administered 2012-03-19: 07:00:00 via INTRAVENOUS

## 2012-03-19 MED ORDER — ONDANSETRON HCL 4 MG PO TABS: 4.0000 mg | ORAL_TABLET | ORAL | Status: DC | PRN

## 2012-03-19 MED ORDER — FENTANYL CITRATE 0.05 MG/ML IJ SOLN
INTRAMUSCULAR | Status: AC
  Administered 2012-03-19: 25 ug via INTRAVENOUS
  Filled 2012-03-19: qty 2

## 2012-03-19 MED ORDER — OXYTOCIN 10 UNIT/ML IJ SOLN
INTRAMUSCULAR | Status: AC
  Filled 2012-03-19: qty 4

## 2012-03-19 MED ORDER — WITCH HAZEL-GLYCERIN EX PADS: 1.0000 "application " | MEDICATED_PAD | CUTANEOUS | Status: DC | PRN

## 2012-03-19 MED ORDER — KETOROLAC TROMETHAMINE 60 MG/2ML IM SOLN
60.0000 mg | Freq: Once | INTRAMUSCULAR | Status: AC | PRN
  Administered 2012-03-19: 60 mg via INTRAMUSCULAR

## 2012-03-19 MED ORDER — ONDANSETRON HCL 4 MG/2ML IJ SOLN: 4.0000 mg | Freq: Three times a day (TID) | INTRAMUSCULAR | Status: DC | PRN

## 2012-03-19 MED ORDER — LIDOCAINE HCL (PF) 1 % IJ SOLN
30.0000 mL | INTRAMUSCULAR | Status: DC | PRN
  Filled 2012-03-19 (×2): qty 30

## 2012-03-19 MED ORDER — CEFAZOLIN SODIUM-DEXTROSE 2-3 GM-% IV SOLR
2.0000 g | Freq: Once | INTRAVENOUS | Status: AC
  Administered 2012-03-19 (×2): 2 g via INTRAVENOUS
  Filled 2012-03-19: qty 50

## 2012-03-19 MED ORDER — LACTATED RINGERS IV SOLN
INTRAVENOUS | Status: DC
  Administered 2012-03-19: 125 mL/h via INTRAVENOUS

## 2012-03-19 MED ORDER — MEASLES, MUMPS & RUBELLA VAC ~~LOC~~ INJ: 0.5000 mL | INJECTION | Freq: Once | SUBCUTANEOUS | Status: DC

## 2012-03-19 MED ORDER — PHENYLEPHRINE 40 MCG/ML (10ML) SYRINGE FOR IV PUSH (FOR BLOOD PRESSURE SUPPORT)
80.0000 ug | PREFILLED_SYRINGE | INTRAVENOUS | Status: DC | PRN
  Filled 2012-03-19: qty 2

## 2012-03-19 MED ORDER — FENTANYL 2.5 MCG/ML BUPIVACAINE 1/10 % EPIDURAL INFUSION (WH - ANES)
INTRAMUSCULAR | Status: DC | PRN
  Administered 2012-03-19: 14 mL/h via EPIDURAL

## 2012-03-19 MED ORDER — CITRIC ACID-SODIUM CITRATE 334-500 MG/5ML PO SOLN
30.0000 mL | ORAL | Status: DC | PRN
  Administered 2012-03-19: 30 mL via ORAL
  Filled 2012-03-19: qty 15

## 2012-03-19 MED ORDER — LANOLIN HYDROUS EX OINT: 1.0000 "application " | TOPICAL_OINTMENT | CUTANEOUS | Status: DC | PRN

## 2012-03-19 MED ORDER — EPHEDRINE 5 MG/ML INJ
10.0000 mg | INTRAVENOUS | Status: DC | PRN
  Filled 2012-03-19: qty 2
  Filled 2012-03-19: qty 4

## 2012-03-19 MED ORDER — ATROPINE SULFATE 0.1 MG/ML IJ SOLN
0.1000 mg | Freq: Once | INTRAMUSCULAR | Status: DC
  Filled 2012-03-19: qty 1

## 2012-03-19 MED ORDER — OXYTOCIN BOLUS FROM INFUSION
500.0000 mL | Freq: Once | INTRAVENOUS | Status: DC
  Filled 2012-03-19: qty 500

## 2012-03-19 MED ORDER — SODIUM BICARBONATE 8.4 % IV SOLN
INTRAVENOUS | Status: AC
  Filled 2012-03-19: qty 50

## 2012-03-19 MED ORDER — NALBUPHINE HCL 10 MG/ML IJ SOLN
5.0000 mg | INTRAMUSCULAR | Status: DC | PRN
  Administered 2012-03-19: 10 mg via SUBCUTANEOUS
  Administered 2012-03-19: 5 mg via SUBCUTANEOUS
  Filled 2012-03-19 (×3): qty 1

## 2012-03-19 MED ORDER — FENTANYL CITRATE 0.05 MG/ML IJ SOLN
25.0000 ug | INTRAMUSCULAR | Status: DC | PRN
  Administered 2012-03-19 (×2): 25 ug via INTRAVENOUS

## 2012-03-19 MED ORDER — NALBUPHINE SYRINGE 5 MG/0.5 ML
10.0000 mg | INJECTION | INTRAMUSCULAR | Status: DC | PRN
  Administered 2012-03-19: 10 mg via INTRAVENOUS
  Filled 2012-03-19 (×3): qty 1

## 2012-03-19 MED ORDER — MEPERIDINE HCL 25 MG/ML IJ SOLN: 6.2500 mg | INTRAMUSCULAR | Status: DC | PRN

## 2012-03-19 MED ORDER — FENTANYL CITRATE 0.05 MG/ML IJ SOLN
INTRAMUSCULAR | Status: AC
  Filled 2012-03-19: qty 2

## 2012-03-19 MED ORDER — MAGNESIUM HYDROXIDE 400 MG/5ML PO SUSP
30.0000 mL | ORAL | Status: DC | PRN
  Administered 2012-03-21: 30 mL via ORAL
  Filled 2012-03-19: qty 30

## 2012-03-19 MED ORDER — NALOXONE HCL 0.4 MG/ML IJ SOLN: 0.4000 mg | INTRAMUSCULAR | Status: DC | PRN

## 2012-03-19 MED ORDER — SENNOSIDES-DOCUSATE SODIUM 8.6-50 MG PO TABS
2.0000 | ORAL_TABLET | Freq: Every day | ORAL | Status: DC
  Administered 2012-03-20 – 2012-03-21 (×2): 2 via ORAL

## 2012-03-19 MED ORDER — EPHEDRINE 5 MG/ML INJ
10.0000 mg | INTRAVENOUS | Status: DC | PRN
  Filled 2012-03-19: qty 2

## 2012-03-19 MED ORDER — OXYTOCIN 40 UNITS IN LACTATED RINGERS INFUSION - SIMPLE MED: 62.5000 mL/h | INTRAVENOUS | Status: AC

## 2012-03-19 MED ORDER — FENTANYL 2.5 MCG/ML BUPIVACAINE 1/10 % EPIDURAL INFUSION (WH - ANES)
14.0000 mL/h | INTRAMUSCULAR | Status: DC
  Filled 2012-03-19: qty 125

## 2012-03-19 MED ORDER — FERROUS SULFATE 325 (65 FE) MG PO TABS
325.0000 mg | ORAL_TABLET | Freq: Two times a day (BID) | ORAL | Status: DC
  Administered 2012-03-20 – 2012-03-21 (×5): 325 mg via ORAL
  Filled 2012-03-19 (×5): qty 1

## 2012-03-19 MED ORDER — IBUPROFEN 600 MG PO TABS: 600.0000 mg | ORAL_TABLET | Freq: Four times a day (QID) | ORAL | Status: DC | PRN

## 2012-03-19 MED ORDER — DIPHENHYDRAMINE HCL 25 MG PO CAPS: 25.0000 mg | ORAL_CAPSULE | Freq: Four times a day (QID) | ORAL | Status: DC | PRN

## 2012-03-19 MED ORDER — MORPHINE SULFATE 0.5 MG/ML IJ SOLN
INTRAMUSCULAR | Status: AC
  Filled 2012-03-19: qty 10

## 2012-03-19 MED ORDER — OXYTOCIN 10 UNIT/ML IJ SOLN
40.0000 [IU] | INTRAVENOUS | Status: DC | PRN
  Administered 2012-03-19: 40 [IU] via INTRAVENOUS

## 2012-03-19 MED ORDER — FENTANYL CITRATE 0.05 MG/ML IJ SOLN
INTRAMUSCULAR | Status: DC | PRN
  Administered 2012-03-19: 100 ug via INTRAVENOUS

## 2012-03-19 MED ORDER — NALBUPHINE HCL 10 MG/ML IJ SOLN
5.0000 mg | INTRAMUSCULAR | Status: DC | PRN
  Filled 2012-03-19: qty 1

## 2012-03-19 MED ORDER — ATROPINE SULFATE 0.1 MG/ML IJ SOLN
0.1000 mg | INTRAMUSCULAR | Status: DC | PRN
  Filled 2012-03-19: qty 1

## 2012-03-19 MED ORDER — ONDANSETRON HCL 4 MG/2ML IJ SOLN: 4.0000 mg | Freq: Four times a day (QID) | INTRAMUSCULAR | Status: DC | PRN

## 2012-03-19 MED ORDER — SODIUM BICARBONATE 8.4 % IV SOLN
INTRAVENOUS | Status: DC | PRN
  Administered 2012-03-19: 4 mL via EPIDURAL

## 2012-03-19 MED ORDER — ONDANSETRON HCL 4 MG/2ML IJ SOLN: 4.0000 mg | INTRAMUSCULAR | Status: DC | PRN

## 2012-03-19 MED ORDER — KETOROLAC TROMETHAMINE 60 MG/2ML IM SOLN
INTRAMUSCULAR | Status: AC
  Administered 2012-03-19: 60 mg via INTRAMUSCULAR
  Filled 2012-03-19: qty 2

## 2012-03-19 MED ORDER — LACTATED RINGERS IV SOLN
INTRAVENOUS | Status: DC | PRN
  Administered 2012-03-19: 13:00:00 via INTRAVENOUS

## 2012-03-19 MED ORDER — DIPHENHYDRAMINE HCL 50 MG/ML IJ SOLN: 12.5000 mg | INTRAMUSCULAR | Status: DC | PRN

## 2012-03-19 MED ORDER — OXYTOCIN 40 UNITS IN LACTATED RINGERS INFUSION - SIMPLE MED
62.5000 mL/h | Freq: Once | INTRAVENOUS | Status: DC
  Filled 2012-03-19: qty 1000

## 2012-03-19 MED ORDER — IBUPROFEN 600 MG PO TABS
600.0000 mg | ORAL_TABLET | Freq: Four times a day (QID) | ORAL | Status: DC
  Administered 2012-03-20 – 2012-03-22 (×9): 600 mg via ORAL
  Filled 2012-03-19 (×7): qty 1

## 2012-03-19 MED ORDER — ZOLPIDEM TARTRATE 5 MG PO TABS: 5.0000 mg | ORAL_TABLET | Freq: Every evening | ORAL | Status: DC | PRN

## 2012-03-19 MED ORDER — SODIUM CHLORIDE 0.9 % IJ SOLN: 3.0000 mL | INTRAMUSCULAR | Status: DC | PRN

## 2012-03-19 MED ORDER — LACTATED RINGERS IV SOLN
500.0000 mL | Freq: Once | INTRAVENOUS | Status: AC
  Administered 2012-03-19: 1000 mL via INTRAVENOUS

## 2012-03-19 MED ORDER — DIPHENHYDRAMINE HCL 25 MG PO CAPS
25.0000 mg | ORAL_CAPSULE | ORAL | Status: DC | PRN
  Administered 2012-03-20 (×2): 25 mg via ORAL
  Filled 2012-03-19 (×2): qty 1

## 2012-03-19 MED ORDER — TETANUS-DIPHTH-ACELL PERTUSSIS 5-2.5-18.5 LF-MCG/0.5 IM SUSP: 0.5000 mL | Freq: Once | INTRAMUSCULAR | Status: DC

## 2012-03-19 MED ORDER — METOCLOPRAMIDE HCL 5 MG/ML IJ SOLN: 10.0000 mg | Freq: Three times a day (TID) | INTRAMUSCULAR | Status: DC | PRN

## 2012-03-19 MED ORDER — LIDOCAINE-EPINEPHRINE (PF) 2 %-1:200000 IJ SOLN
INTRAMUSCULAR | Status: AC
  Filled 2012-03-19: qty 20

## 2012-03-19 MED ORDER — NALBUPHINE SYRINGE 5 MG/0.5 ML
10.0000 mg | INJECTION | Freq: Four times a day (QID) | INTRAMUSCULAR | Status: DC | PRN
  Administered 2012-03-19: 10 mg via INTRAMUSCULAR
  Filled 2012-03-19: qty 1

## 2012-03-19 MED ORDER — ATROPINE SULFATE 0.1 MG/ML IJ SOLN
0.0010 mg | INTRAMUSCULAR | Status: DC | PRN
  Filled 2012-03-19: qty 0.01

## 2012-03-19 MED ORDER — IBUPROFEN 600 MG PO TABS
600.0000 mg | ORAL_TABLET | Freq: Four times a day (QID) | ORAL | Status: DC | PRN
  Filled 2012-03-19: qty 1

## 2012-03-19 SURGICAL SUPPLY — 48 items
APL SKNCLS STERI-STRIP NONHPOA (GAUZE/BANDAGES/DRESSINGS)
BENZOIN TINCTURE PRP APPL 2/3 (GAUZE/BANDAGES/DRESSINGS) ×1 IMPLANT
CANISTER WOUND CARE 500ML ATS (WOUND CARE) IMPLANT
CLOTH BEACON ORANGE TIMEOUT ST (SAFETY) ×2 IMPLANT
CONTAINER PREFILL 10% NBF 15ML (MISCELLANEOUS) IMPLANT
DRAPE SURG 17X23 STRL (DRAPES) ×2 IMPLANT
DRESSING TELFA 8X3 (GAUZE/BANDAGES/DRESSINGS) ×1 IMPLANT
DRSG COVADERM 4X10 (GAUZE/BANDAGES/DRESSINGS) IMPLANT
DRSG VAC ATS LRG SENSATRAC (GAUZE/BANDAGES/DRESSINGS) IMPLANT
DRSG VAC ATS MED SENSATRAC (GAUZE/BANDAGES/DRESSINGS) IMPLANT
DRSG VAC ATS SM SENSATRAC (GAUZE/BANDAGES/DRESSINGS) IMPLANT
DURAPREP 26ML APPLICATOR (WOUND CARE) ×2 IMPLANT
ELECT REM PT RETURN 9FT ADLT (ELECTROSURGICAL)
ELECTRODE REM PT RTRN 9FT ADLT (ELECTROSURGICAL) ×1 IMPLANT
EXTRACTOR VACUUM M CUP 4 TUBE (SUCTIONS) IMPLANT
GAUZE SPONGE 4X4 12PLY STRL LF (GAUZE/BANDAGES/DRESSINGS) ×3 IMPLANT
GLOVE BIO SURGEON STRL SZ 6.5 (GLOVE) ×8 IMPLANT
GLOVE BIO SURGEON STRL SZ8 (GLOVE) ×1 IMPLANT
GLOVE BIOGEL PI IND STRL 7.0 (GLOVE) IMPLANT
GLOVE BIOGEL PI INDICATOR 7.0 (GLOVE) ×1
GOWN PREVENTION PLUS LG XLONG (DISPOSABLE) ×6 IMPLANT
KIT ABG SYR 3ML LUER SLIP (SYRINGE) IMPLANT
NDL HYPO 25X5/8 SAFETYGLIDE (NEEDLE) ×1 IMPLANT
NEEDLE HYPO 25X5/8 SAFETYGLIDE (NEEDLE) IMPLANT
NS IRRIG 1000ML POUR BTL (IV SOLUTION) ×4 IMPLANT
PACK C SECTION WH (CUSTOM PROCEDURE TRAY) ×2 IMPLANT
PAD ABD 7.5X8 STRL (GAUZE/BANDAGES/DRESSINGS) ×1 IMPLANT
PAD OB MATERNITY 4.3X12.25 (PERSONAL CARE ITEMS) ×1 IMPLANT
RTRCTR C-SECT PINK 25CM LRG (MISCELLANEOUS) IMPLANT
SLEEVE SCD COMPRESS KNEE MED (MISCELLANEOUS) ×1 IMPLANT
SPONGE GAUZE 4X4 12PLY (GAUZE/BANDAGES/DRESSINGS) ×1 IMPLANT
STAPLER VISISTAT 35W (STAPLE) IMPLANT
STRIP CLOSURE SKIN 1/2X4 (GAUZE/BANDAGES/DRESSINGS) ×1 IMPLANT
SUT MNCRL 0 VIOLET CTX 36 (SUTURE) ×2 IMPLANT
SUT MNCRL AB 3-0 PS2 27 (SUTURE) IMPLANT
SUT MONOCRYL 0 CTX 36 (SUTURE) ×2
SUT PDS AB 0 CTX 36 PDP370T (SUTURE) ×2 IMPLANT
SUT PLAIN 0 NONE (SUTURE) IMPLANT
SUT VIC AB 0 CTXB 36 (SUTURE) IMPLANT
SUT VIC AB 2-0 CT1 (SUTURE) ×2 IMPLANT
SUT VIC AB 2-0 CT1 27 (SUTURE) ×2
SUT VIC AB 2-0 CT1 TAPERPNT 27 (SUTURE) ×1 IMPLANT
SUT VIC AB 2-0 SH 27 (SUTURE)
SUT VIC AB 2-0 SH 27XBRD (SUTURE) IMPLANT
TAPE CLOTH SURG 4X10 WHT LF (GAUZE/BANDAGES/DRESSINGS) ×1 IMPLANT
TOWEL OR 17X24 6PK STRL BLUE (TOWEL DISPOSABLE) ×4 IMPLANT
TRAY FOLEY CATH 14FR (SET/KITS/TRAYS/PACK) ×1 IMPLANT
WATER STERILE IRR 1000ML POUR (IV SOLUTION) ×2 IMPLANT

## 2012-03-19 NOTE — Anesthesia Postprocedure Evaluation (Signed)
Anesthesia Post Note  Patient: Lindsey [name]  Procedure(s) Performed: Procedure(s) (LRB): CESAREAN SECTION (N/A)  Anesthesia type: Epidural  Patient location: PACU  Post pain: Pain level controlled  Post assessment: Post-op Vital signs reviewed  Last Vitals:  Filed Vitals:   03/19/12 1530  BP: 141/77  Pulse: 63  Temp:   Resp: 14    Post vital signs: stable  Level of consciousness: awake  Complications: No apparent anesthesia complications

## 2012-03-19 NOTE — Anesthesia Procedure Notes (Signed)
Epidural Patient location during procedure: OB  Preanesthetic Checklist Completed: patient identified, site marked, surgical consent, pre-op evaluation, timeout performed, IV checked, risks and benefits discussed and monitors and equipment checked  Epidural Patient position: sitting Prep: site prepped and draped and DuraPrep Patient monitoring: continuous pulse ox and blood pressure Approach: midline Injection technique: LOR air  Needle:  Needle type: Tuohy  Needle gauge: 17 G Needle length: 9 cm and 9 Needle insertion depth: 5 cm cm Catheter type: closed end flexible Catheter size: 19 Gauge Catheter at skin depth: 10 cm Test dose: negative  Assessment Events: blood not aspirated, injection not painful, no injection resistance, negative IV test and no paresthesia  Additional Notes Dosing of Epidural:  1st dose, through catheter ............................................. epi 1:200K + Xylocaine 40 mg  2nd dose, through catheter, after waiting 3 minutes.....epi 1:200K + Xylocaine 40 mg   ( 2% Xylo charted as a single dose in Epic Meds for ease of charting; actual dosing was fractionated as above, for saftey's sake)  As each dose occurred, patient was free of IV sx; and patient exhibited no evidence of SA injection.  Patient is more comfortable after epidural dosed. Please see RN's note for documentation of vital signs,and FHR which are stable.  Patient reminded not to try to ambulate with numb legs, and that an RN must be present the 1st time she attempts to get up.    

## 2012-03-19 NOTE — Op Note (Signed)
Cesarean Section Procedure Note   [name]   03/19/2012  Indications: Category II FHT with repetitive moderate variable decelerations   Pre-operative Diagnosis: Same, arrest of dilatation, active phase of labor  Post-operative Diagnosis: Same   Surgeon: Antionette Char A  Assistants: none  Anesthesia: epidural  Procedure Details:  The patient was seen in the Holding Room. The risks, benefits, complications, treatment options, and expected outcomes were discussed with the patient. The patient concurred with the proposed plan, giving informed consent. The patient was identified as Lindsey [name] and the procedure verified as C-Section Delivery. A Time Out was held and the above information confirmed.  After induction of anesthesia, the patient was draped and prepped in the usual sterile manner. A transverse incision was made and carried down through the subcutaneous tissue to the fascia. The fascial incision was made and extended transversely. The fascia was separated from the underlying rectus tissue superiorly and inferiorly. The peritoneum was identified and entered. The peritoneal incision was extended longitudinally. The utero-vesical peritoneal reflection was incised transversely and the bladder flap was bluntly freed from the lower uterine segment. A low transverse uterine incision was made. Delivered from cephalic presentation was a  living newborn female infant.  APGAR (1 MIN): 8   APGAR (5 MINS): 9   APGAR (10 MINS):     A cord ph was not sent. The umbilical cord was clamped and cut cord. A sample was obtained for evaluation. The placenta was removed Intact and appeared normal.  The uterine incision was closed with running locked sutures of 1-0 Monocryl. A second imbricating layer of the same suture was placed.  Hemostasis was observed. The paracolic gutters were irrigated. The parieto peritoneum was closed in a running fashion with 2-0 Vicryl.  The fascia was then  reapproximated with running sutures of 0 Vicryl.  The skin was closed with suture.  Instrument, sponge, and needle counts were correct prior the abdominal closure and were correct at the conclusion of the case.    Findings:  Nuchal cord x 1, loose   Estimated Blood Loss: 600 ml  Total IV Fluids: per Anesthesiology   Urine Output: per Anesthesiology  Specimens: Placenta  Complications: no complications  Disposition: PACU - hemodynamically stable.  Maternal Condition: stable   Baby condition / location:  nursery-stable    Signed: Surgeon(s): Antionette Char, MD

## 2012-03-19 NOTE — Progress Notes (Signed)
SW received call from L&D RN stating that patient's sister had questions about being able to have custody of patient's baby due to patient's drug use.  SW notes that patient is positive for Cocaine on admission.  SW attempted to meet with patient and sister, with clearance from RN, but patient was asleep.  Patient's sister woke up, but SW explained that SW could not discuss patient's case with sister without patient's consent.  Patient's sister was understanding and informed SW that she thinks her sister is doing drugs and wants to make sure there are not drugs in the baby's system.  She was whispering so patient would not hear her say this.  She asked SW if she would be able to take the baby home.  SW explained that we will evaluate the situation and involve Child Protective Services if needed.  SW explained that CPS would determine baby's disposition with input from patient and anyone she chooses if it is necessary to have CPS involvement.  SW informed sister that myself or the SW on the weekend will follow up with them once baby is born.  She stated understanding and thanked SW.

## 2012-03-19 NOTE — H&P (Signed)
Lindsey [name] is a 36 y.o. female presenting for UC's. Maternal Medical History:  Reason for admission: Reason for admission: contractions.  36 yo G4 P1021.  EDC 03-25-12.  Presents with UC's.  Contractions: Onset was 6-12 hours ago.   Frequency: regular.    Fetal activity: Perceived fetal activity is normal.   Last perceived fetal movement was within the past hour.    Prenatal complications: no prenatal complications Prenatal Complications - Diabetes: none.    OB History    Grav Para Term Preterm Abortions TAB SAB Ect Mult Living   4 1 1  0 2 0 2 0 0 1     Past Medical History  Diagnosis Date  . H/O drug abuse     crack, clean for 3 months  . Trichomonas   . GERD (gastroesophageal reflux disease)    Past Surgical History  Procedure Date  . No past surgeries    Family History: family history is negative for Anesthesia problems, and Hypotension, and Malignant hyperthermia, and Pseudochol deficiency, . Social History:  reports that she has been smoking Cigarettes.  She has been smoking about .01 packs per day. She has never used smokeless tobacco. She reports that she uses illicit drugs ("Crack" cocaine). She reports that she does not drink alcohol.   Prenatal Transfer Tool  Maternal Diabetes: No Genetic Screening: Normal Maternal Ultrasounds/Referrals: Normal Fetal Ultrasounds or other Referrals:  None Maternal Substance Abuse:  Yes:  Type: Cocaine Significant Maternal Medications:  Meds include: Other:  Significant Maternal Lab Results:  Lab values include: Group B Strep negative Other Comments:  None  Review of Systems  All other systems reviewed and are negative.    Dilation: 3 Effacement (%): 90 Station: -1 Exam by:: DCALLAWAY, RN Blood pressure 145/127, pulse 127, resp. rate 18, height 5\' 3"  (1.6 m), weight 60.782 kg (134 lb), last menstrual period 06/19/2011. Maternal Exam:  Uterine Assessment: Contraction strength is moderate.  Abdomen: Patient  reports no abdominal tenderness. Fetal presentation: vertex  Introitus: Normal vulva. Normal vagina.  Pelvis: adequate for delivery.   Cervix: Cervix evaluated by digital exam.     Physical Exam  Nursing note and vitals reviewed. Constitutional: She is oriented to person, place, and time. She appears well-developed and well-nourished.  HENT:  Head: Normocephalic and atraumatic.  Eyes: Conjunctivae normal are normal. Pupils are equal, round, and reactive to light.  Neck: Normal range of motion. Neck supple.  Cardiovascular: Normal rate and regular rhythm.   Respiratory: Effort normal.  GI: Soft.  Genitourinary: Vagina normal and uterus normal.  Musculoskeletal: Normal range of motion.  Neurological: She is alert and oriented to person, place, and time.  Skin: Skin is warm and dry.    Prenatal labs: ABO, Rh: --/--/AB POS (05/20 1230) Antibody: Negative (05/08 0000) Rubella: Immune (05/08 0000) RPR: Nonreactive (05/08 0000)  HBsAg: Negative (05/08 0000)  HIV: Non-reactive (05/08 0000)  GBS: Negative (09/22 0000)   Assessment/Plan: 39 weeks.  Early labor.  Admit.  Expectant management.   HARPER,CHARLES A 03/19/2012, 2:27 AM

## 2012-03-19 NOTE — Progress Notes (Signed)
Per Dr. Thomes Lolling request, called with SVE update and request for epidural. Dr. Clearance Coots approved epidural at this time.

## 2012-03-19 NOTE — MAU Note (Signed)
PT ARRIVED VIA EMS - [place].-  VE 3 CM- FHR- 152.

## 2012-03-19 NOTE — Anesthesia Preprocedure Evaluation (Signed)
Anesthesia Evaluation  Patient identified by MRN, date of birth, ID band Patient awake    Reviewed: Allergy & Precautions, H&P , Patient's Chart, lab work & pertinent test results  Airway Mallampati: II TM Distance: >3 FB Neck ROM: full    Dental  (+) Teeth Intact   Pulmonary  breath sounds clear to auscultation        Cardiovascular Rhythm:regular Rate:Normal     Neuro/Psych    GI/Hepatic   Endo/Other    Renal/GU      Musculoskeletal   Abdominal   Peds  Hematology   Anesthesia Other Findings  (+) cocaine urine test this hosp     Reproductive/Obstetrics (+) Pregnancy                           Anesthesia Physical Anesthesia Plan  ASA: II  Anesthesia Plan: Epidural   Post-op Pain Management:    Induction:   Airway Management Planned:   Additional Equipment:   Intra-op Plan:   Post-operative Plan:   Informed Consent: I have reviewed the patients History and Physical, chart, labs and discussed the procedure including the risks, benefits and alternatives for the proposed anesthesia with the patient or authorized representative who has indicated his/her understanding and acceptance.   Dental Advisory Given  Plan Discussed with:   Anesthesia Plan Comments: (Labs checked- platelets confirmed with RN in room. Fetal heart tracing, per RN, reported to be stable enough for sitting procedure. Discussed epidural, and patient consents to the procedure:  included risk of possible headache,backache, failed block, allergic reaction, and nerve injury. This patient was asked if she had any questions or concerns before the procedure started. )        Anesthesia Quick Evaluation

## 2012-03-19 NOTE — Progress Notes (Signed)
Lindsey Lindsey [name] is Lindsey [name] 36 y.o. [phone] at [redacted]w[redacted]d by LMP admitted for active labor  Subjective:   Objective: BP 118/69  Pulse 66  Resp 18  Ht 5\' 3"  (1.6 m)  Wt 60.782 kg (134 lb)  BMI 23.74 kg/m2  LMP 06/19/2011      FHT:  FHR: 150-160 bpm, variability: moderate,  accelerations:  Present,  decelerations:  Present variables UC:   regular, every 3-4 minutes SVE:   Dilation: 5 Effacement (%): 90 Station: -1;-2 Exam by:: L.Mears, RN  Labs: Lab Results  Component Value Date   WBC 8.0 03/19/2012   HGB 13.0 03/19/2012   HCT 38.7 03/19/2012   MCV 93.7 03/19/2012   PLT 189 03/19/2012    Assessment / Plan: Spontaneous labor, progressing normally.  Drug screen positive for cocaine.  Labor: Progressing normally Preeclampsia:  n/Lindsey [name] Fetal Wellbeing:  Category I Pain Control:  Epidural I/D:  n/Lindsey [name] Anticipated MOD:  NSVD  Lindsey Lindsey [name] 03/19/2012, 5:43 AM

## 2012-03-19 NOTE — Transfer of Care (Signed)
Immediate Anesthesia Transfer of Care Note  Patient: Lindsey [name]  Procedure(s) Performed: Procedure(s) (LRB) with comments: CESAREAN SECTION (N/A)  Patient Location: PACU  Anesthesia Type: Epidural  Level of Consciousness: awake, alert  and oriented  Airway & Oxygen Therapy: Patient Spontanous Breathing  Post-op Assessment: Report given to PACU RN and Post -op Vital signs reviewed and stable  Post vital signs: Reviewed and stable  Complications: No apparent anesthesia complications

## 2012-03-19 NOTE — Progress Notes (Signed)
Lindsey [name] is [name] 36 y.o. [phone] at [redacted]w[redacted]d by LMP admitted for active labor  Subjective: Comfortable  Objective: BP 115/61  Pulse 64  Temp 97.8 F (36.6 C) (Axillary)  Resp 20  Ht 5\' 3"  (1.6 m)  Wt 60.782 kg (134 lb)  BMI 23.74 kg/m2  SpO2 98%  LMP 06/19/2011   Total I/O In: -  Out: 200 [Urine:200]  FHT:  FHR: 120 bpm, variability: moderate,  accelerations:  Present,  decelerations:  Present moderate variable decelerations UC:   irregular, every 5 minutes SVE:   Dilation: 6 Effacement (%): 80 Station: -1;-2 Exam by:: Dr. Tamela Oddi  Labs: Lab Results  Component Value Date   WBC 8.0 03/19/2012   HGB 13.0 03/19/2012   HCT 38.7 03/19/2012   MCV 93.7 03/19/2012   PLT 189 03/19/2012    Assessment / Plan: Arrest in active phase of labor  Labor: see above Preeclampsia:  n/[name] Fetal Wellbeing:  Category II Pain Control:  Epidural I/D:  n/[name] Anticipated MOD:  C/D; risks/benefits reviewed with the patient  Lindsey [name] 03/19/2012, 12:42 PM

## 2012-03-20 LAB — CBC
Hemoglobin: 11.9 g/dL — ABNORMAL LOW (ref 12.0–15.0)
MCH: 32.3 pg (ref 26.0–34.0)
MCHC: 35 g/dL (ref 30.0–36.0)
MCV: 92.4 fL (ref 78.0–100.0)
Platelets: 158 10*3/uL (ref 150–400)
RBC: 3.68 MIL/uL — ABNORMAL LOW (ref 3.87–5.11)
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
## 2012-03-20 NOTE — Progress Notes (Signed)
CSW received follow-up call from DSS.  DSS plans to come and speak with MOB either late evening today, or tomorrow morning.  CSW spoke with RN and made her aware of DSS plans.  CSW will continue to follow and report to RN and MD about discharge plans when DSS determines plan of care.    [phone]

## 2012-03-20 NOTE — Progress Notes (Signed)
Patient ID: Lindsey [name], female   DOB: 1975-10-17, 36 y.o.   MRN: [id number] Postop day 1 Vital signs normal Incision clean and dry Legs negative doing well

## 2012-03-20 NOTE — Anesthesia Postprocedure Evaluation (Signed)
Anesthesia Post Note  Patient: Lindsey [name]  Procedure(s) Performed: Procedure(s) (LRB): CESAREAN SECTION (N/A)  Anesthesia type: Epidural  Patient location: Mother/Baby  Post pain: Pain level controlled  Post assessment: Post-op Vital signs reviewed  Last Vitals:  Filed Vitals:   03/20/12 0520  BP: 114/59  Pulse: 84  Temp: 36.9 C  Resp: 20    Post vital signs: Reviewed  Level of consciousness: awake  Complications: No apparent anesthesia complications

## 2012-03-20 NOTE — Addendum Note (Signed)
Addendum  created 03/20/12 0749 by Jhonnie Garner, CRNA   Modules edited:Notes Section

## 2012-03-21 NOTE — Plan of Care (Signed)
Nurse observed mother giving infant a bottle that had been out used for hours. Mother proceeded to give infant a bottle propped up on blankets and not holding the bottle at all. The mother states that is was great to give the bottle that way. Nurse proceeded to instruct mom on the dangers of propping up a bottle to feed an infant, and instructed mother on the proper way. The mother continued the feeding with the bottle propped. Nurse offered to finish feeding in the nursery for mom, mom agreed.

## 2012-03-21 NOTE — Progress Notes (Signed)
Patient ID: Lindsey [name], female   DOB: [birth year], 36 y.o.   MRN: [id number] Postop day 2 Vital signs normal Fundus firm Lochia moderate Legs negative No complaints

## 2012-03-21 NOTE — Clinical Social Work Note (Signed)
CSW spoke with DSS before then went in to MOB's room.  DSS left before CSW could return to discuss plan of care.  CSW has attempted x3 to contact DSS worker about discharge plans.  CSW asked weekday CSW to follow-up with DSS in the a.m. About discharge plans.    [phone] 

## 2012-03-22 ENCOUNTER — Encounter (HOSPITAL_COMMUNITY): Payer: Self-pay | Admitting: Obstetrics & Gynecology

## 2012-03-22 MED ORDER — OXYCODONE-ACETAMINOPHEN 5-325 MG PO TABS: 1.0000 | ORAL_TABLET | ORAL | Status: DC | PRN

## 2012-03-22 MED ORDER — IBUPROFEN 600 MG PO TABS: 600.0000 mg | ORAL_TABLET | Freq: Four times a day (QID) | ORAL | Status: DC | PRN

## 2012-03-22 NOTE — Discharge Summary (Signed)
Obstetric Discharge Summary Reason for Admission: onset of labor Prenatal Procedures: ultrasound Intrapartum Procedures: cesarean: low cervical, transverse Postpartum Procedures: none Complications-Operative and Postpartum: none Hemoglobin  Date Value Range Status  03/20/2012 11.9* 12.0 - 15.0 g/dL Final     HCT  Date Value Range Status  03/20/2012 34.0* 36.0 - 46.0 % Final    Physical Exam:  General: alert and no distress Lochia: appropriate Uterine Fundus: firm Incision: healing well DVT Evaluation: No evidence of DVT seen on physical exam.  Discharge Diagnoses: Term Pregnancy-delivered  Discharge Information: Date: 03/22/2012 Activity: pelvic rest Diet: routine Medications: PNV, Ibuprofen, Colace and Percocet Condition: stable Instructions: refer to practice specific booklet Discharge to: home Follow-up Information    Follow up with HARPER,CHARLES A, MD. Schedule an appointment as soon as possible for a visit in 2 weeks.   Contact information:   [address] [place] [address] [phone]          Newborn Data: Live born female  Birth Weight: 5 lb 1.3 oz (2305 g) APGAR: 8, 9  Home with mother.  HARPER,CHARLES A 03/22/2012, 5:40 AM

## 2012-03-22 NOTE — Progress Notes (Signed)
Subjective: Postpartum Day 3: Cesarean Delivery Patient reports incisional pain.    Objective: Vital signs in last 24 hours: Temp:  [97.5 F (36.4 C)-98.2 F (36.8 C)] 98 F (36.7 C) (10/13 2214) Pulse Rate:  [74-79] 79  (10/13 2214) Resp:  [18] 18  (10/13 2214) BP: (121-163)/(71-90) 144/85 mmHg (10/13 2214) SpO2:  [99 %] 99 % (10/13 2125)  Physical Exam:  General: alert and no distress Lochia: appropriate Uterine Fundus: firm Incision: healing well DVT Evaluation: No evidence of DVT seen on physical exam.   Basename 03/20/12 0545  HGB 11.9*  HCT 34.0*    Assessment/Plan: Status post Cesarean section. Doing well postoperatively.  Discharge home with standard precautions and return to clinic in 2 weeks.  Reginald Mangels A 03/22/2012, 5:30 AM

## 2012-03-22 NOTE — Progress Notes (Signed)
SW spoke with after hours CPS worker/Charles Key who states that he met with patient yesterday and completed a Safety Plan.  He has cleared her for discharge with infant to patient's sister's home.  SW stated concerns about patient's admitted Cocaine use two days prior to delivery and Mr. Key informed SW that SW could call CPS to find out who the case has been assigned to if SW has concerns about the Safety Plan.  SW has been on hold for over 30 minutes.  Patient and baby may discharge when medically ready and SW will follow up with worker when able.  SW will monitor meconium drug screen results. 

## 2012-03-22 NOTE — Progress Notes (Signed)
Pt's sister in room and the pt had the baby in the bed. Discussed with pt the safety concerns of having the baby in the bed with her. The sister said "give me that baby before you hurt the baby." The pt's sister has fed the baby and changed the baby all morning and has advised pt on how to take care of the baby. The pt has breast fed the baby after being advised not to due to drug use.

## 2012-03-22 NOTE — Progress Notes (Signed)
Ur chart review completed.  

## 2012-03-26 ENCOUNTER — Encounter (HOSPITAL_COMMUNITY): Payer: Self-pay | Admitting: *Deleted

## 2012-03-26 ENCOUNTER — Inpatient Hospital Stay (HOSPITAL_COMMUNITY)
Admission: AD | Admit: 2012-03-26 | Discharge: 2012-03-26 | Disposition: A | Discharge status: Home / Self Care | Payer: Medicaid Other | Source: Ambulatory Visit | Attending: Obstetrics & Gynecology | Admitting: Obstetrics & Gynecology

## 2012-03-26 DIAGNOSIS — A5901 Trichomonal vulvovaginitis: Secondary | ICD-10-CM | POA: Insufficient documentation

## 2012-03-26 DIAGNOSIS — O2390 Unspecified genitourinary tract infection in pregnancy, unspecified trimester: Secondary | ICD-10-CM | POA: Insufficient documentation

## 2012-03-26 DIAGNOSIS — B9689 Other specified bacterial agents as the cause of diseases classified elsewhere: Secondary | ICD-10-CM | POA: Insufficient documentation

## 2012-03-26 DIAGNOSIS — O9833 Other infections with a predominantly sexual mode of transmission complicating the puerperium: Secondary | ICD-10-CM

## 2012-03-26 DIAGNOSIS — N760 Acute vaginitis: Secondary | ICD-10-CM

## 2012-03-26 DIAGNOSIS — O9883 Other maternal infectious and parasitic diseases complicating the puerperium: Secondary | ICD-10-CM | POA: Insufficient documentation

## 2012-03-26 DIAGNOSIS — N949 Unspecified condition associated with female genital organs and menstrual cycle: Secondary | ICD-10-CM | POA: Insufficient documentation

## 2012-03-26 DIAGNOSIS — A599 Trichomoniasis, unspecified: Secondary | ICD-10-CM

## 2012-03-26 DIAGNOSIS — A499 Bacterial infection, unspecified: Secondary | ICD-10-CM | POA: Insufficient documentation

## 2012-03-26 LAB — URINALYSIS, ROUTINE W REFLEX MICROSCOPIC
Glucose, UA: NEGATIVE mg/dL
Leukocytes, UA: NEGATIVE
Nitrite: NEGATIVE
Protein, ur: NEGATIVE mg/dL
Urobilinogen, UA: 0.2 mg/dL (ref 0.0–1.0)

## 2012-03-26 LAB — URINE MICROSCOPIC-ADD ON

## 2012-03-26 LAB — CBC
HCT: 40.3 % (ref 36.0–46.0)
Platelets: 304 10*3/uL (ref 150–400)
RDW: 12.9 % (ref 11.5–15.5)
WBC: 7.3 10*3/uL (ref 4.0–10.5)

## 2012-03-26 LAB — RAPID URINE DRUG SCREEN, HOSP PERFORMED
Benzodiazepines: NOT DETECTED
Cocaine: NOT DETECTED
Opiates: NOT DETECTED

## 2012-03-26 LAB — WET PREP, GENITAL

## 2012-03-26 MED ORDER — PHENAZOPYRIDINE HCL 100 MG PO TABS
200.0000 mg | ORAL_TABLET | Freq: Once | ORAL | Status: AC
  Administered 2012-03-26: 200 mg via ORAL
  Filled 2012-03-26: qty 2

## 2012-03-26 MED ORDER — METRONIDAZOLE 500 MG PO TABS: 500.0000 mg | ORAL_TABLET | Freq: Two times a day (BID) | ORAL | Status: DC

## 2012-03-26 MED ORDER — FLUCONAZOLE 150 MG PO TABS: 150.0000 mg | ORAL_TABLET | Freq: Once | ORAL | Status: DC

## 2012-03-26 MED ORDER — KETOROLAC TROMETHAMINE 60 MG/2ML IM SOLN
60.0000 mg | Freq: Once | INTRAMUSCULAR | Status: AC
  Administered 2012-03-26: 60 mg via INTRAMUSCULAR
  Filled 2012-03-26: qty 2

## 2012-03-26 MED ORDER — FLUCONAZOLE 150 MG PO TABS
150.0000 mg | ORAL_TABLET | Freq: Once | ORAL | Status: AC
  Administered 2012-03-26: 150 mg via ORAL
  Filled 2012-03-26: qty 1

## 2012-03-26 NOTE — MAU Note (Signed)
Had c/s last Friday, ? Something down there tore.  Was having vag pain prior to d/c, thinks it was from all the exams in labor. Can't sit, constipated.

## 2012-03-26 NOTE — MAU Provider Note (Signed)
History     CSN: [id number]  Arrival date and time: 03/26/12 1353   First Provider Initiated Contact with Patient 03/26/12 1509      Chief Complaint  Patient presents with  . Vaginal Pain   HPI  Pt is one week post partum s/p C-Section and presents with vaginal pain which is worse when she urinates.  Pt says her pain started prior to her delivery with vaginal exams.  Pt denies chills or fever and her incision is clean and dry healing well.  Pt's pain has progressively gotten worse, especially whe she urinates.  Past Medical History  Diagnosis Date  . H/O drug abuse     crack, clean for 3 months  . Trichomonas   . GERD (gastroesophageal reflux disease)     Past Surgical History  Procedure Date  . No past surgeries   . Cesarean section 03/19/2012    Procedure: CESAREAN SECTION;  Surgeon: Lindsey Char, MD;  Location: [facility];  Service: Obstetrics;  Laterality: N/A;    Family History  Problem Relation Age of Onset  . Anesthesia problems Neg Hx   . Hypotension Neg Hx   . Malignant hyperthermia Neg Hx   . Pseudochol deficiency Neg Hx     History  Substance Use Topics  . Smoking status: Current Every Day Smoker -- 0.0 packs/day    Types: Cigarettes  . Smokeless tobacco: Never Used  . Alcohol Use: No    Allergies: No Known Allergies  Prescriptions prior to admission  Medication Sig Dispense Refill  . acetaminophen (TYLENOL) 500 MG tablet Take 500 mg by mouth every 6 (six) hours as needed. pain      . docusate sodium (COLACE) 100 MG capsule Take 1 capsule (100 mg total) by mouth 2 (two) times daily as needed. Stool softener  30 capsule  1  . ibuprofen (ADVIL,MOTRIN) 600 MG tablet Take 1 tablet (600 mg total) by mouth every 6 (six) hours as needed for pain.  30 tablet  5  . oxyCODONE-acetaminophen (PERCOCET/ROXICET) 5-325 MG per tablet Take 1-2 tablets by mouth every 4 (four) hours as needed for pain (moderate - severe pain).  40 tablet  0  . pantoprazole  (PROTONIX) 40 MG tablet TAKE ONE TABLET BY MOUTH EVERY DAY AT NOON  30 tablet  1  . Prenatal Vit-Fe Fumarate-FA (PRENATAL MULTIVITAMIN) TABS Take 1 tablet by mouth daily.  30 tablet  1    Review of Systems  Constitutional: Negative for fever and chills.  Gastrointestinal: Positive for abdominal pain. Negative for nausea, vomiting, diarrhea and constipation.  Genitourinary: Positive for dysuria, urgency and frequency.  Neurological: Negative for headaches.   Physical Exam   Blood pressure 142/74, pulse 67, temperature 97 F (36.1 C), temperature source Oral, resp. rate 16, last menstrual period 06/19/2011, currently breastfeeding.  Physical Exam  Nursing note and vitals reviewed. Constitutional: She is oriented to person, place, and time. She appears well-developed and well-nourished. No distress.  HENT:  Head: Normocephalic.  Eyes: Pupils are equal, round, and reactive to light.  Neck: Normal range of motion. Neck supple.  Cardiovascular: Normal rate.   Respiratory: Effort normal.  GI: Soft. There is tenderness. There is no rebound and no guarding.       Low transverse incision clean and dry with steri strips; mildly tender; no redness or erythema  Genitourinary:       Mod amount of opaque frothy pink bleeding in vault; cervix clean, slightly tender;vaginal mucosa reddened;  uterus involuting well.  Musculoskeletal: Normal range of motion.  Neurological: She is alert and oriented to person, place, and time.  Skin: Skin is warm and dry.  Psychiatric: She has a normal mood and affect.    MAU Course  Procedures Toradol 60mg  IM given for pain with some relief Results for orders placed during the hospital encounter of 03/26/12 (from the past 24 hour(s))  URINALYSIS, ROUTINE W REFLEX MICROSCOPIC     Status: Abnormal   Collection Time   03/26/12  2:00 PM      Component Value Range   Color, Urine YELLOW  YELLOW   APPearance CLOUDY (*) CLEAR   Specific Gravity, Urine 1.015  1.005 -  1.030   pH 8.5 (*) 5.0 - 8.0   Glucose, UA NEGATIVE  NEGATIVE mg/dL   Hgb urine dipstick TRACE (*) NEGATIVE   Bilirubin Urine NEGATIVE  NEGATIVE   Ketones, ur NEGATIVE  NEGATIVE mg/dL   Protein, ur NEGATIVE  NEGATIVE mg/dL   Urobilinogen, UA 0.2  0.0 - 1.0 mg/dL   Nitrite NEGATIVE  NEGATIVE   Leukocytes, UA NEGATIVE  NEGATIVE  URINE MICROSCOPIC-ADD ON     Status: Abnormal   Collection Time   03/26/12  2:00 PM      Component Value Range   Squamous Epithelial / LPF RARE  RARE   Crystals TRIPLE PHOSPHATE CRYSTALS (*) NEGATIVE   Urine-Other AMORPHOUS URATES/PHOSPHATES    CBC     Status: Normal   Collection Time   03/26/12  3:33 PM      Component Value Range   WBC 7.3  4.0 - 10.5 K/uL   RBC 4.31  3.87 - 5.11 MIL/uL   Hemoglobin 13.6  12.0 - 15.0 g/dL   HCT 16.1  09.6 - 04.5 %   MCV 93.5  78.0 - 100.0 fL   MCH 31.6  26.0 - 34.0 pg   MCHC 33.7  30.0 - 36.0 g/dL   RDW 40.9  81.1 - 91.4 %   Platelets 304  150 - 400 K/uL  WET PREP, GENITAL     Status: Abnormal   Collection Time   03/26/12  4:06 PM      Component Value Range   Yeast Wet Prep HPF POC NONE SEEN  NONE SEEN   Trich, Wet Prep FEW (*) NONE SEEN   Clue Cells Wet Prep HPF POC FEW (*) NONE SEEN   WBC, Wet Prep HPF POC FEW (*) NONE SEEN   Assessment and Plan  S/P C-Section 1 week Vaginal pain with trichomonas and bacterial vaginosis Prescription for Flagyl 500mg  BID for 7 days F/u with Dr. Tamela [name] for PP/P.op exam  Lindsey [name] 03/26/2012, 3:17 PM

## 2012-03-26 NOTE — MAU Note (Signed)
States pain started with exams, "something happened," got her epid and "blacked out".  Hurt to urinate, burns- started in labor.  Pain is worse when she pees. Baby is doing well, gaining weight- breast and bottle feeding.  Has been constipated, had first BM today, very hard.

## 2012-03-26 NOTE — MAU Note (Addendum)
Incision healing well, no drainage.  Steri strips remain in place.  Was given and rx for antibiotics- has not been seen, was called in - has not started yet.

## 2012-03-27 LAB — GC/CHLAMYDIA PROBE AMP, GENITAL
Chlamydia, DNA Probe: NEGATIVE
GC Probe Amp, Genital: NEGATIVE

## 2012-04-02 ENCOUNTER — Inpatient Hospital Stay (HOSPITAL_COMMUNITY)
Admission: AD | Admit: 2012-04-02 | Discharge: 2012-04-02 | Discharge status: Against Medical Advice | Payer: Medicaid Other | Source: Ambulatory Visit | Attending: Obstetrics | Admitting: Obstetrics

## 2012-04-02 DIAGNOSIS — N949 Unspecified condition associated with female genital organs and menstrual cycle: Secondary | ICD-10-CM | POA: Insufficient documentation

## 2012-04-02 LAB — URINE MICROSCOPIC-ADD ON

## 2012-04-02 LAB — URINALYSIS, ROUTINE W REFLEX MICROSCOPIC
Bilirubin Urine: NEGATIVE
Glucose, UA: NEGATIVE mg/dL
Protein, ur: NEGATIVE mg/dL
Urobilinogen, UA: 0.2 mg/dL (ref 0.0–1.0)

## 2012-04-02 NOTE — MAU Note (Signed)
Patient was not in the lobby when called to a room.  

## 2012-04-02 NOTE — MAU Note (Signed)
Patient is not in the lobby when called to a room in MAU.  

## 2012-04-02 NOTE — ED Notes (Signed)
Not in lobby

## 2012-04-02 NOTE — MAU Note (Signed)
Patient states she has been having vaginal pain since her cesarean section 10-11. Was seen on 10-18 an diagnosed with trich and UTI and has taken both medications but states she is still having pain with urination.

## 2012-04-03 LAB — URINE CULTURE: Colony Count: NO GROWTH

## 2012-07-09 ENCOUNTER — Other Ambulatory Visit (HOSPITAL_COMMUNITY): Payer: Self-pay | Admitting: Obstetrics and Gynecology

## 2012-12-31 ENCOUNTER — Emergency Department (HOSPITAL_BASED_OUTPATIENT_CLINIC_OR_DEPARTMENT_OTHER)
Admission: EM | Admit: 2012-12-31 | Discharge: 2012-12-31 | Disposition: A | Discharge status: Home / Self Care | Payer: Medicaid Other | Attending: Emergency Medicine | Admitting: Emergency Medicine

## 2012-12-31 ENCOUNTER — Emergency Department (HOSPITAL_BASED_OUTPATIENT_CLINIC_OR_DEPARTMENT_OTHER)
Admission: EM | Admit: 2012-12-31 | Discharge: 2012-12-31 | Disposition: A | Discharge status: Home / Self Care | Payer: Medicaid Other | Source: Home / Self Care | Attending: Emergency Medicine | Admitting: Emergency Medicine

## 2012-12-31 ENCOUNTER — Encounter (HOSPITAL_BASED_OUTPATIENT_CLINIC_OR_DEPARTMENT_OTHER): Payer: Self-pay | Admitting: *Deleted

## 2012-12-31 DIAGNOSIS — Z8619 Personal history of other infectious and parasitic diseases: Secondary | ICD-10-CM | POA: Insufficient documentation

## 2012-12-31 DIAGNOSIS — K219 Gastro-esophageal reflux disease without esophagitis: Secondary | ICD-10-CM | POA: Insufficient documentation

## 2012-12-31 DIAGNOSIS — Z79899 Other long term (current) drug therapy: Secondary | ICD-10-CM | POA: Insufficient documentation

## 2012-12-31 DIAGNOSIS — F17200 Nicotine dependence, unspecified, uncomplicated: Secondary | ICD-10-CM | POA: Insufficient documentation

## 2012-12-31 DIAGNOSIS — R21 Rash and other nonspecific skin eruption: Secondary | ICD-10-CM

## 2012-12-31 DIAGNOSIS — F1911 Other psychoactive substance abuse, in remission: Secondary | ICD-10-CM | POA: Insufficient documentation

## 2012-12-31 DIAGNOSIS — K5900 Constipation, unspecified: Secondary | ICD-10-CM | POA: Insufficient documentation

## 2012-12-31 DIAGNOSIS — Y9289 Other specified places as the place of occurrence of the external cause: Secondary | ICD-10-CM | POA: Insufficient documentation

## 2012-12-31 DIAGNOSIS — Z8659 Personal history of other mental and behavioral disorders: Secondary | ICD-10-CM | POA: Insufficient documentation

## 2012-12-31 DIAGNOSIS — W57XXXA Bitten or stung by nonvenomous insect and other nonvenomous arthropods, initial encounter: Secondary | ICD-10-CM | POA: Insufficient documentation

## 2012-12-31 DIAGNOSIS — T148 Other injury of unspecified body region: Secondary | ICD-10-CM | POA: Insufficient documentation

## 2012-12-31 DIAGNOSIS — Y939 Activity, unspecified: Secondary | ICD-10-CM | POA: Insufficient documentation

## 2012-12-31 MED ORDER — DIPHENHYDRAMINE HCL 25 MG PO CAPS
25.0000 mg | ORAL_CAPSULE | Freq: Once | ORAL | Status: AC
  Administered 2012-12-31: 25 mg via ORAL
  Filled 2012-12-31: qty 1

## 2012-12-31 MED ORDER — HYDROXYZINE HCL 25 MG PO TABS
25.0000 mg | ORAL_TABLET | Freq: Once | ORAL | Status: AC
  Administered 2012-12-31: 25 mg via ORAL
  Filled 2012-12-31: qty 1

## 2012-12-31 MED ORDER — PERMETHRIN 5 % EX CREA: TOPICAL_CREAM | CUTANEOUS | Status: DC

## 2012-12-31 MED ORDER — NICOTINE 14 MG/24HR TD PT24: 1.0000 | MEDICATED_PATCH | TRANSDERMAL | Status: DC

## 2012-12-31 NOTE — ED Notes (Signed)
Pt called for triage, noted outside with [facility] employee. Registration will call this rn when pt is ready for triage.

## 2012-12-31 NOTE — ED Provider Notes (Signed)
CSN: [id number]     Arrival date & time 12/31/12  1714 History     First MD Initiated Contact with Patient 12/31/12 1847     Chief Complaint  Patient presents with  . Rash   (Consider location/radiation/quality/duration/timing/severity/associated sxs/prior Treatment) HPI  37 year old female presents for evaluation of a rash.  Pt report she is currently staying at [facility] for detox.  Both of her and her roommate has developed itchy rash.  Her roommate has the rash for 5 days.  Pt developed rash yesterday.  Rash is itchy, started at the back of her neck, to her back and now to her upper chest. No fever, headache, n/v/d, cp, sob.  Was seen in ER today for same, was given benadryl, it has not helped.  Has been at [facility] for 2 months, has to switch room periodically.Denies any recent rec drug use.   Past Medical History  Diagnosis Date  . H/O drug abuse     crack, clean for 3 months  . Trichomonas   . GERD (gastroesophageal reflux disease)    Past Surgical History  Procedure Laterality Date  . No past surgeries    . Cesarean section  03/19/2012    Procedure: CESAREAN SECTION;  Surgeon: Antionette Char, MD;  Location: [facility];  Service: Obstetrics;  Laterality: N/A;   Family History  Problem Relation Age of Onset  . Anesthesia problems Neg Hx   . Hypotension Neg Hx   . Malignant hyperthermia Neg Hx   . Pseudochol deficiency Neg Hx    History  Substance Use Topics  . Smoking status: Current Every Day Smoker -- 0.01 packs/day    Types: Cigarettes  . Smokeless tobacco: Never Used  . Alcohol Use: No   OB History   Grav Para Term Preterm Abortions TAB SAB Ect Mult Living   4 2 2  0 2 0 2 0 0 2     Review of Systems  Constitutional: Negative for fever.  Skin: Positive for rash.  Neurological: Negative for headaches.  All other systems reviewed and are negative.    Allergies  Review of patient's allergies indicates no known allergies.  Home Medications   Current  Outpatient Rx  Name  Route  Sig  Dispense  Refill  . acetaminophen (TYLENOL) 500 MG tablet   Oral   Take 500 mg by mouth every 6 (six) hours as needed. pain         . carvedilol (COREG) 12.5 MG tablet   Oral   Take 12.5 mg by mouth 2 (two) times daily with a meal.         . docusate sodium (COLACE) 100 MG capsule   Oral   Take 1 capsule (100 mg total) by mouth 2 (two) times daily as needed. Stool softener   30 capsule   1   . fluconazole (DIFLUCAN) 150 MG tablet   Oral   Take 1 tablet (150 mg total) by mouth once.   2 tablet   3   . ibuprofen (ADVIL,MOTRIN) 600 MG tablet   Oral   Take 1 tablet (600 mg total) by mouth every 6 (six) hours as needed for pain.   30 tablet   5   . metroNIDAZOLE (FLAGYL) 500 MG tablet   Oral   Take 1 tablet (500 mg total) by mouth 2 (two) times daily.   14 tablet   0   . nicotine (NICODERM CQ - DOSED IN MG/24 HOURS) 14 mg/24hr patch   Transdermal  Place 1 patch onto the skin daily.   28 patch   0   . oxyCODONE-acetaminophen (PERCOCET/ROXICET) 5-325 MG per tablet   Oral   Take 1-2 tablets by mouth every 4 (four) hours as needed for pain (moderate - severe pain).   40 tablet   0   . pantoprazole (PROTONIX) 40 MG tablet      TAKE ONE TABLET BY MOUTH EVERY DAY AT NOON   30 tablet   0   . Prenatal Vit-Fe Fumarate-FA (PRENATAL MULTIVITAMIN) TABS   Oral   Take 1 tablet by mouth daily.   30 tablet   1   . risperiDONE (RISPERDAL) 0.5 MG tablet   Oral   Take 0.5 mg by mouth 2 (two) times daily.         Marland Kitchen triamterene-hydrochlorothiazide (MAXZIDE-25) 37.5-25 MG per tablet   Oral   Take 1 tablet by mouth daily.          BP 120/60  Pulse 68  Temp(Src) 97.8 F (36.6 C) (Oral)  Resp 16  SpO2 100%  LMP 12/01/2012 Physical Exam  Nursing note and vitals reviewed. Constitutional: She appears well-developed and well-nourished. No distress.  HENT:  Head: Atraumatic.  Mouth/Throat: Oropharynx is clear and moist.  Eyes:  Conjunctivae are normal.  Neck: Neck supple.  Cardiovascular: Normal rate and regular rhythm.   Pulmonary/Chest: Effort normal and breath sounds normal. She has no wheezes.  Neurological: She is alert.  Skin: Skin is warm. Rash (several mildly raised erythematous 1-2cm lesions around neck, upper back and upper chest, with excoriation marks.  ) noted.    ED Course   Procedures (including critical care time)  Pt with rash, with appearance to suggest bed bug.  Roommate has same.  Pt recommend to wash all belongings in hot water.  Also recommend continue with benadryl.  Dermatology referral given.  Return precaution given.  Recommend notifying [facility] to have further inspection for possible bed bug.  Labs Reviewed - No data to display No results found. 1. Rash     MDM  BP 120/60  Pulse 68  Temp(Src) 97.8 F (36.6 C) (Oral)  Resp 16  SpO2 100%  LMP 12/01/2012   Fayrene Helper, PA-C 12/31/12 1918

## 2012-12-31 NOTE — ED Notes (Signed)
Pt amb to room 12 with quick steady gait in nad. Pt reports awakening at 4am along with her room mate at [facility] itching and scratching, "some kind of bug ate [place] up last night!" pt also reports constipation last bm Monday, painful.

## 2012-12-31 NOTE — ED Notes (Addendum)
Pt seen here this am for same, DX rash return now for increased itching, pt is from [facility]

## 2012-12-31 NOTE — ED Provider Notes (Signed)
CSN: [id number]     Arrival date & time 12/31/12  4098 History     First MD Initiated Contact with Patient 12/31/12 0940     Chief Complaint  Patient presents with  . Pruritis  . Constipation   (Consider location/radiation/quality/duration/timing/severity/associated sxs/prior Treatment) HPI Patient presents from [facility] stating that she has itching and that occurred last night facility. She states she and her roommate "RE.". She did not see anything. She has a bump on the left posterior neck but she states itches. She states her whole back is burning and itching. She has not had any chest pain or shortness of breath.  Past Medical History  Diagnosis Date  . H/O drug abuse     crack, clean for 3 months  . Trichomonas   . GERD (gastroesophageal reflux disease)    Past Surgical History  Procedure Laterality Date  . No past surgeries    . Cesarean section  03/19/2012    Procedure: CESAREAN SECTION;  Surgeon: Antionette Char, MD;  Location: [facility];  Service: Obstetrics;  Laterality: N/A;   Family History  Problem Relation Age of Onset  . Anesthesia problems Neg Hx   . Hypotension Neg Hx   . Malignant hyperthermia Neg Hx   . Pseudochol deficiency Neg Hx    History  Substance Use Topics  . Smoking status: Current Every Day Smoker -- 0.01 packs/day    Types: Cigarettes  . Smokeless tobacco: Never Used  . Alcohol Use: No   OB History   Grav Para Term Preterm Abortions TAB SAB Ect Mult Living   4 2 2  0 2 0 2 0 0 2     Review of Systems  All other systems reviewed and are negative.    Allergies  Review of patient's allergies indicates no known allergies.  Home Medications   Current Outpatient Rx  Name  Route  Sig  Dispense  Refill  . carvedilol (COREG) 12.5 MG tablet   Oral   Take 12.5 mg by mouth 2 (two) times daily with a meal.         . risperiDONE (RISPERDAL) 0.5 MG tablet   Oral   Take 0.5 mg by mouth 2 (two) times daily.         Marland Kitchen  triamterene-hydrochlorothiazide (MAXZIDE-25) 37.5-25 MG per tablet   Oral   Take 1 tablet by mouth daily.         Marland Kitchen acetaminophen (TYLENOL) 500 MG tablet   Oral   Take 500 mg by mouth every 6 (six) hours as needed. pain         . docusate sodium (COLACE) 100 MG capsule   Oral   Take 1 capsule (100 mg total) by mouth 2 (two) times daily as needed. Stool softener   30 capsule   1   . fluconazole (DIFLUCAN) 150 MG tablet   Oral   Take 1 tablet (150 mg total) by mouth once.   2 tablet   3   . ibuprofen (ADVIL,MOTRIN) 600 MG tablet   Oral   Take 1 tablet (600 mg total) by mouth every 6 (six) hours as needed for pain.   30 tablet   5   . metroNIDAZOLE (FLAGYL) 500 MG tablet   Oral   Take 1 tablet (500 mg total) by mouth 2 (two) times daily.   14 tablet   0   . oxyCODONE-acetaminophen (PERCOCET/ROXICET) 5-325 MG per tablet   Oral   Take 1-2 tablets by mouth every  4 (four) hours as needed for pain (moderate - severe pain).   40 tablet   0   . pantoprazole (PROTONIX) 40 MG tablet      TAKE ONE TABLET BY MOUTH EVERY DAY AT NOON   30 tablet   0   . Prenatal Vit-Fe Fumarate-FA (PRENATAL MULTIVITAMIN) TABS   Oral   Take 1 tablet by mouth daily.   30 tablet   1    BP 125/75  Pulse 60  Temp(Src) 98.3 F (36.8 C) (Oral)  Resp 18  SpO2 100%  LMP 12/01/2012 Physical Exam  Nursing note and vitals reviewed. Constitutional: She is oriented to person, place, and time. She appears well-developed and well-nourished.  HENT:  Head: Normocephalic and atraumatic.  Right Ear: External ear normal.  Left Ear: External ear normal.  Nose: Nose normal.  Mouth/Throat: Oropharynx is clear and moist.  Eyes: Pupils are equal, round, and reactive to light.  Neck: Normal range of motion.  Cardiovascular: Normal rate, regular rhythm and normal heart sounds.   Pulmonary/Chest: Effort normal and breath sounds normal.  Abdominal: Soft. Bowel sounds are normal.  Musculoskeletal: Normal  range of motion.  Neurological: She is alert and oriented to person, place, and time. She has normal reflexes.  Skin: Skin is warm and dry.  Raised slightly erythematous 1 x 1 cm lump on the back of her neck. No other lesions noted.  Psychiatric: She has a normal mood and affect. Her behavior is normal. Thought content normal.    ED Course   Procedures (including critical care time)  Labs Reviewed - No data to display No results found. No diagnosis found.  MDM  1 the bite plan Benadryl 2 constipation patient states she has had some difficulty with constipation she is advised given conservative management of increasing fluids, fiber, and activity. She'll be given discharge instructions regarding constipation. 3 patient states that she is still smoking. She is advised regarding smoking cessation and requests nicotine patch and will be given a prescription for this.  Hilario Quarry, MD 12/31/12 1006

## 2012-12-31 NOTE — ED Notes (Signed)
Pt requests "something for my heartburn, it's been killing me..." md alerted.

## 2013-01-01 NOTE — ED Provider Notes (Signed)
Medical screening examination/treatment/procedure(s) were performed by non-physician practitioner and as supervising physician I was immediately available for consultation/collaboration.   Thersa Mohiuddin M Muna Demers, MD 01/01/13 1247 

## 2013-11-24 ENCOUNTER — Encounter (HOSPITAL_BASED_OUTPATIENT_CLINIC_OR_DEPARTMENT_OTHER): Payer: Self-pay | Admitting: Emergency Medicine

## 2013-11-24 ENCOUNTER — Emergency Department (HOSPITAL_BASED_OUTPATIENT_CLINIC_OR_DEPARTMENT_OTHER)
Admission: EM | Admit: 2013-11-24 | Discharge: 2013-11-24 | Disposition: A | Discharge status: Home / Self Care | Payer: Medicaid Other | Attending: Emergency Medicine | Admitting: Emergency Medicine

## 2013-11-24 DIAGNOSIS — F17200 Nicotine dependence, unspecified, uncomplicated: Secondary | ICD-10-CM | POA: Insufficient documentation

## 2013-11-24 DIAGNOSIS — Z8619 Personal history of other infectious and parasitic diseases: Secondary | ICD-10-CM | POA: Insufficient documentation

## 2013-11-24 DIAGNOSIS — Z791 Long term (current) use of non-steroidal anti-inflammatories (NSAID): Secondary | ICD-10-CM | POA: Insufficient documentation

## 2013-11-24 DIAGNOSIS — I10 Essential (primary) hypertension: Secondary | ICD-10-CM | POA: Insufficient documentation

## 2013-11-24 DIAGNOSIS — J029 Acute pharyngitis, unspecified: Secondary | ICD-10-CM | POA: Insufficient documentation

## 2013-11-24 DIAGNOSIS — Z79899 Other long term (current) drug therapy: Secondary | ICD-10-CM | POA: Insufficient documentation

## 2013-11-24 DIAGNOSIS — Z8719 Personal history of other diseases of the digestive system: Secondary | ICD-10-CM | POA: Insufficient documentation

## 2013-11-24 HISTORY — DX: Essential (primary) hypertension: I10

## 2013-11-24 LAB — RAPID STREP SCREEN (MED CTR MEBANE ONLY): Streptococcus, Group A Screen (Direct): NEGATIVE

## 2013-11-24 MED ORDER — DEXAMETHASONE 6 MG PO TABS: 6.0000 mg | ORAL_TABLET | Freq: Every day | ORAL | Status: DC

## 2013-11-24 MED ORDER — LORATADINE 10 MG PO TABS: 10.0000 mg | ORAL_TABLET | Freq: Every day | ORAL | Status: DC

## 2013-11-24 MED ORDER — DEXAMETHASONE 1 MG/ML PO CONC
10.0000 mg | Freq: Once | ORAL | Status: AC
  Administered 2013-11-24: 10 mg via ORAL
  Filled 2013-11-24: qty 30

## 2013-11-24 NOTE — ED Provider Notes (Signed)
CSN: [id number]     Arrival date & time 11/24/13  [id number] History   First MD Initiated Contact with Patient 11/24/13 [id number]     Chief Complaint  Patient presents with  . Sore Throat     (Consider location/radiation/quality/duration/timing/severity/associated sxs/prior Treatment) Patient is a 38 y.o. female presenting with pharyngitis. The history is provided by the patient. No language interpreter was used.  Sore Throat This is a new problem. The current episode started more than 1 week ago (2 weeks). The problem occurs constantly. The problem has been gradually worsening. Pertinent negatives include no chest pain, no abdominal pain, no headaches and no shortness of breath. The symptoms are aggravated by swallowing. Nothing relieves the symptoms. Treatments tried: naproxen. The treatment provided no relief.    Past Medical History  Diagnosis Date  . H/O drug abuse     crack, clean for 3 months  . Trichomonas   . GERD (gastroesophageal reflux disease)   . Hypertension    Past Surgical History  Procedure Laterality Date  . No past surgeries    . Cesarean section  03/19/2012    Procedure: CESAREAN SECTION;  Surgeon: Antionette CharLisa Jackson-Moore, MD;  Location: [facility];  Service: Obstetrics;  Laterality: N/A;   Family History  Problem Relation Age of Onset  . Anesthesia problems Neg Hx   . Hypotension Neg Hx   . Malignant hyperthermia Neg Hx   . Pseudochol deficiency Neg Hx    History  Substance Use Topics  . Smoking status: Current Every Day Smoker -- 0.01 packs/day    Types: Cigarettes  . Smokeless tobacco: Never Used  . Alcohol Use: No   OB History   Grav Para Term Preterm Abortions TAB SAB Ect Mult Living   4 2 2  0 2 0 2 0 0 2     Review of Systems  Constitutional: Negative for fever, chills, diaphoresis, activity change, appetite change and fatigue.  HENT: Positive for sore throat. Negative for congestion, facial swelling and rhinorrhea.   Eyes: Negative for photophobia and  discharge.  Respiratory: Negative for cough, chest tightness and shortness of breath.   Cardiovascular: Negative for chest pain, palpitations and leg swelling.  Gastrointestinal: Negative for nausea, vomiting, abdominal pain and diarrhea.  Endocrine: Negative for polydipsia and polyuria.  Genitourinary: Negative for dysuria, frequency, difficulty urinating and pelvic pain.  Musculoskeletal: Negative for arthralgias, back pain, neck pain and neck stiffness.  Skin: Negative for color change and wound.  Allergic/Immunologic: Negative for immunocompromised state.  Neurological: Negative for facial asymmetry, weakness, numbness and headaches.  Hematological: Does not bruise/bleed easily.  Psychiatric/Behavioral: Negative for confusion and agitation.      Allergies  Review of patient's allergies indicates no known allergies.  Home Medications   Prior to Admission medications   Medication Sig Start Date End Date Taking? Authorizing Provider  citalopram (CELEXA) 20 MG tablet Take 20 mg by mouth daily.   Yes Historical Provider, MD  naproxen sodium (ANAPROX) 550 MG tablet Take 550 mg by mouth 2 (two) times daily with a meal.   Yes Historical Provider, MD  QUEtiapine (SEROQUEL XR) 50 MG TB24 24 hr tablet Take 50 mg by mouth.   Yes Historical Provider, MD  QUEtiapine Fumarate (SEROQUEL XR) 150 MG 24 hr tablet Take 150 mg by mouth at bedtime.   Yes Historical Provider, MD  dexamethasone (DECADRON) 6 MG tablet Take 1 tablet (6 mg total) by mouth daily. For 3 days starting 6/19/'15 11/24/13   Shanna CiscoMegan E Docherty, MD  loratadine (CLARITIN) 10 MG tablet Take 1 tablet (10 mg total) by mouth daily. One po daily 11/24/13   Shanna CiscoMegan E Docherty, MD   BP 131/73  Pulse 64  Temp(Src) 98.1 F (36.7 C) (Oral)  Resp 16  Ht 5\' 3"  (1.6 m)  Wt 108 lb (48.988 kg)  BMI 19.14 kg/m2  SpO2 100%  LMP 11/21/2013 Physical Exam  Constitutional: She is oriented to person, place, and time. She appears well-developed and  well-nourished. No distress.  HENT:  Head: Normocephalic and atraumatic.  Mouth/Throat: Mucous membranes are normal. Posterior oropharyngeal erythema present. No oropharyngeal exudate or posterior oropharyngeal edema.    Eyes: Pupils are equal, round, and reactive to light.  Neck: Normal range of motion. Neck supple.  Cardiovascular: Normal rate, regular rhythm and normal heart sounds.  Exam reveals no gallop and no friction rub.   No murmur heard. Pulmonary/Chest: Effort normal and breath sounds normal. No respiratory distress. She has no wheezes. She has no rales.  Abdominal: Soft. Bowel sounds are normal. She exhibits no distension and no mass. There is no tenderness. There is no rebound and no guarding.  Musculoskeletal: Normal range of motion. She exhibits no edema and no tenderness.  Neurological: She is alert and oriented to person, place, and time.  Skin: Skin is warm and dry.  Psychiatric: She has a normal mood and affect.    ED Course  Procedures (including critical care time) Labs Review Labs Reviewed  RAPID STREP SCREEN  CULTURE, GROUP A STREP    Imaging Review No results found.   EKG Interpretation None      MDM   Final diagnoses:  Pharyngitis    Pt is a 38 y.o. female with Pmhx as above who presents with 2 weeks of sore throat. She reports fever 2 weeks ago, none since. +pain w/ swallowing, but no difficulty swallowing. She feels there is mucus in the back of her throat. She has had nasal congestion. On PE, VSS, pt in NAD. +posterior oropharyngeal erythema w/o edema, exudates. Midline uvula. Clear mucus seen. Rapid strep sent. PO decadron given.    Rapid strep negative. Will d/c home w/ 3 days decadron for inflammation as well as trial of Claritin and I feel symptoms may be related to post-nasal gtt due ot seasonal allergies. Return precautions given for new or worsening symptoms including worsening pain, fever, swelling.         Shanna CiscoMegan E Docherty,  MD 11/24/13 1017

## 2013-11-24 NOTE — ED Notes (Signed)
Patient states she is a resident at [facility].  States for the last two weeks she has had a sore throat.  States she ran a fever the first day, and none since.  Painful to swallow.  Denies other symptoms.

## 2013-11-24 NOTE — Discharge Instructions (Signed)

## 2013-11-26 LAB — CULTURE, GROUP A STREP

## 2014-04-10 ENCOUNTER — Encounter (HOSPITAL_BASED_OUTPATIENT_CLINIC_OR_DEPARTMENT_OTHER): Payer: Self-pay | Admitting: Emergency Medicine

## 2014-05-31 ENCOUNTER — Emergency Department (HOSPITAL_COMMUNITY)
Admission: EM | Admit: 2014-05-31 | Discharge: 2014-05-31 | Discharge status: Against Medical Advice | Payer: Medicaid Other | Attending: Emergency Medicine | Admitting: Emergency Medicine

## 2014-05-31 ENCOUNTER — Encounter (HOSPITAL_COMMUNITY): Payer: Self-pay | Admitting: Emergency Medicine

## 2014-05-31 DIAGNOSIS — I10 Essential (primary) hypertension: Secondary | ICD-10-CM | POA: Insufficient documentation

## 2014-05-31 DIAGNOSIS — Z720 Tobacco use: Secondary | ICD-10-CM | POA: Insufficient documentation

## 2014-05-31 DIAGNOSIS — Z111 Encounter for screening for respiratory tuberculosis: Secondary | ICD-10-CM | POA: Insufficient documentation

## 2014-05-31 NOTE — ED Provider Notes (Signed)
Patient left without being seen after being told that we do not do TB testing here in the ED.  Eben Burowourtney A Forcucci, PA-C 05/31/14 1605  Gilda Creasehristopher J. Pollina, MD 05/31/14 1729

## 2014-05-31 NOTE — ED Notes (Signed)
Pt left after hearing that we don't do TB tests in the ER.

## 2014-05-31 NOTE — ED Notes (Signed)
Pt states that she needs a TB test for a halfway house she is trying to get into.

## 2014-06-08 ENCOUNTER — Encounter (HOSPITAL_COMMUNITY): Payer: Self-pay | Admitting: *Deleted

## 2014-06-08 ENCOUNTER — Emergency Department (HOSPITAL_COMMUNITY)
Admission: EM | Admit: 2014-06-08 | Discharge: 2014-06-08 | Disposition: A | Discharge status: Home / Self Care | Payer: Medicaid Other | Attending: Emergency Medicine | Admitting: Emergency Medicine

## 2014-06-08 DIAGNOSIS — Z3490 Encounter for supervision of normal pregnancy, unspecified, unspecified trimester: Secondary | ICD-10-CM

## 2014-06-08 DIAGNOSIS — O99331 Smoking (tobacco) complicating pregnancy, first trimester: Secondary | ICD-10-CM | POA: Insufficient documentation

## 2014-06-08 DIAGNOSIS — Z3A01 Less than 8 weeks gestation of pregnancy: Secondary | ICD-10-CM | POA: Insufficient documentation

## 2014-06-08 DIAGNOSIS — Z8619 Personal history of other infectious and parasitic diseases: Secondary | ICD-10-CM | POA: Insufficient documentation

## 2014-06-08 DIAGNOSIS — O9989 Other specified diseases and conditions complicating pregnancy, childbirth and the puerperium: Secondary | ICD-10-CM | POA: Insufficient documentation

## 2014-06-08 DIAGNOSIS — O10911 Unspecified pre-existing hypertension complicating pregnancy, first trimester: Secondary | ICD-10-CM | POA: Insufficient documentation

## 2014-06-08 DIAGNOSIS — Z79899 Other long term (current) drug therapy: Secondary | ICD-10-CM | POA: Insufficient documentation

## 2014-06-08 DIAGNOSIS — Z7952 Long term (current) use of systemic steroids: Secondary | ICD-10-CM | POA: Insufficient documentation

## 2014-06-08 DIAGNOSIS — Z791 Long term (current) use of non-steroidal anti-inflammatories (NSAID): Secondary | ICD-10-CM | POA: Insufficient documentation

## 2014-06-08 DIAGNOSIS — F17210 Nicotine dependence, cigarettes, uncomplicated: Secondary | ICD-10-CM | POA: Insufficient documentation

## 2014-06-08 DIAGNOSIS — R110 Nausea: Secondary | ICD-10-CM | POA: Insufficient documentation

## 2014-06-08 DIAGNOSIS — R0789 Other chest pain: Secondary | ICD-10-CM | POA: Insufficient documentation

## 2014-06-08 DIAGNOSIS — Z8719 Personal history of other diseases of the digestive system: Secondary | ICD-10-CM | POA: Insufficient documentation

## 2014-06-08 LAB — POC URINE PREG, ED: PREG TEST UR: POSITIVE — AB

## 2014-06-08 NOTE — ED Notes (Signed)
Pt states she has had chest wall/breast pain for the past 2 weeks. Pt also complains of nausea for 1 week. Pt states she may be pregnant, has missed her period.

## 2014-06-08 NOTE — Discharge Instructions (Signed)
It was our pleasure to provide your ER care today - we hope that you feel better.  Rest. Drink plenty of fluids.  Take tylenol as need.  Your celexa and seroquel are listed as pregnancy category c medications - discuss plan on whether to continue with your primary care doctor and gynecologist.  Definitely avoid smoking.   Follow up with ob/gyn doctor in 1 week - see referral - call office to arrange appointment.  If pregnancy related issues such as pelvic pain or vaginal bleeding, [facility].  Return to ER if worse, persistent/recurrent chest pain, trouble breathing, other concern.     First Trimester of Pregnancy The first trimester of pregnancy is from week 1 until the end of week 12 (months 1 through 3). A week after a sperm fertilizes an egg, the egg will implant on the wall of the uterus. This embryo will begin to develop into a baby. Genes from you and your partner are forming the baby. The female genes determine whether the baby is a boy or a girl. At 6-8 weeks, the eyes and face are formed, and the heartbeat can be seen on ultrasound. At the end of 12 weeks, all the baby's organs are formed.  Now that you are pregnant, you will want to do everything you can to have a healthy baby. Two of the most important things are to get good prenatal care and to follow your health care provider's instructions. Prenatal care is all the medical care you receive before the baby's birth. This care will help prevent, find, and treat any problems during the pregnancy and childbirth. BODY CHANGES Your body goes through many changes during pregnancy. The changes vary from woman to woman.   You may gain or lose a couple of pounds at first.  You may feel sick to your stomach (nauseous) and throw up (vomit). If the vomiting is uncontrollable, call your health care provider.  You may tire easily.  You may develop headaches that can be relieved by medicines approved by your health care  provider.  You may urinate more often. Painful urination may mean you have a bladder infection.  You may develop heartburn as a result of your pregnancy.  You may develop constipation because certain hormones are causing the muscles that push waste through your intestines to slow down.  You may develop hemorrhoids or swollen, bulging veins (varicose veins).  Your breasts may begin to grow larger and become tender. Your nipples may stick out more, and the tissue that surrounds them (areola) may become darker.  Your gums may bleed and may be sensitive to brushing and flossing.  Dark spots or blotches (chloasma, mask of pregnancy) may develop on your face. This will likely fade after the baby is born.  Your menstrual periods will stop.  You may have a loss of appetite.  You may develop cravings for certain kinds of food.  You may have changes in your emotions from day to day, such as being excited to be pregnant or being concerned that something may go wrong with the pregnancy and baby.  You may have more vivid and strange dreams.  You may have changes in your hair. These can include thickening of your hair, rapid growth, and changes in texture. Some women also have hair loss during or after pregnancy, or hair that feels dry or thin. Your hair will most likely return to normal after your baby is born. WHAT TO EXPECT AT YOUR PRENATAL VISITS  During a routine prenatal visit:  You will be weighed to make sure you and the baby are growing normally.  Your blood pressure will be taken.  Your abdomen will be measured to track your baby's growth.  The fetal heartbeat will be listened to starting around week 10 or 12 of your pregnancy.  Test results from any previous visits will be discussed. Your health care provider may ask you:  How you are feeling.  If you are feeling the baby move.  If you have had any abnormal symptoms, such as leaking fluid, bleeding, severe headaches, or  abdominal cramping.  If you have any questions. Other tests that may be performed during your first trimester include:  Blood tests to find your blood type and to check for the presence of any previous infections. They will also be used to check for low iron levels (anemia) and Rh antibodies. Later in the pregnancy, blood tests for diabetes will be done along with other tests if problems develop.  Urine tests to check for infections, diabetes, or protein in the urine.  An ultrasound to confirm the proper growth and development of the baby.  An amniocentesis to check for possible genetic problems.  Fetal screens for spina bifida and Down syndrome.  You may need other tests to make sure you and the baby are doing well. HOME CARE INSTRUCTIONS  Medicines  Follow your health care provider's instructions regarding medicine use. Specific medicines may be either safe or unsafe to take during pregnancy.  Take your prenatal vitamins as directed.  If you develop constipation, try taking a stool softener if your health care provider approves. Diet  Eat regular, well-balanced meals. Choose a variety of foods, such as meat or vegetable-based protein, fish, milk and low-fat dairy products, vegetables, fruits, and whole grain breads and cereals. Your health care provider will help you determine the amount of weight gain that is right for you.  Avoid raw meat and uncooked cheese. These carry germs that can cause birth defects in the baby.  Eating four or five small meals rather than three large meals a day may help relieve nausea and vomiting. If you start to feel nauseous, eating a few soda crackers can be helpful. Drinking liquids between meals instead of during meals also seems to help nausea and vomiting.  If you develop constipation, eat more high-fiber foods, such as fresh vegetables or fruit and whole grains. Drink enough fluids to keep your urine clear or pale yellow. Activity and  Exercise  Exercise only as directed by your health care provider. Exercising will help you:  Control your weight.  Stay in shape.  Be prepared for labor and delivery.  Experiencing pain or cramping in the lower abdomen or low back is a good sign that you should stop exercising. Check with your health care provider before continuing normal exercises.  Try to avoid standing for long periods of time. Move your legs often if you must stand in one place for a long time.  Avoid heavy lifting.  Wear low-heeled shoes, and practice good posture.  You may continue to have sex unless your health care provider directs you otherwise. Relief of Pain or Discomfort  Wear a good support bra for breast tenderness.   Take warm sitz baths to soothe any pain or discomfort caused by hemorrhoids. Use hemorrhoid cream if your health care provider approves.   Rest with your legs elevated if you have leg cramps or low back pain.  If you  develop varicose veins in your legs, wear support hose. Elevate your feet for 15 minutes, 3-4 times a day. Limit salt in your diet. Prenatal Care  Schedule your prenatal visits by the twelfth week of pregnancy. They are usually scheduled monthly at first, then more often in the last 2 months before delivery.  Write down your questions. Take them to your prenatal visits.  Keep all your prenatal visits as directed by your health care provider. Safety  Wear your seat belt at all times when driving.  Make a list of emergency phone numbers, including numbers for family, friends, the hospital, and police and fire departments. General Tips  Ask your health care provider for a referral to a local prenatal education class. Begin classes no later than at the beginning of month 6 of your pregnancy.  Ask for help if you have counseling or nutritional needs during pregnancy. Your health care provider can offer advice or refer you to specialists for help with various  needs.  Do not use hot tubs, steam rooms, or saunas.  Do not douche or use tampons or scented sanitary pads.  Do not cross your legs for long periods of time.  Avoid cat litter boxes and soil used by cats. These carry germs that can cause birth defects in the baby and possibly loss of the fetus by miscarriage or stillbirth.  Avoid all smoking, herbs, alcohol, and medicines not prescribed by your health care provider. Chemicals in these affect the formation and growth of the baby.  Schedule a dentist appointment. At home, brush your teeth with a soft toothbrush and be gentle when you floss. SEEK MEDICAL CARE IF:   You have dizziness.  You have mild pelvic cramps, pelvic pressure, or nagging pain in the abdominal area.  You have persistent nausea, vomiting, or diarrhea.  You have a bad smelling vaginal discharge.  You have pain with urination.  You notice increased swelling in your face, hands, legs, or ankles. SEEK IMMEDIATE MEDICAL CARE IF:   You have a fever.  You are leaking fluid from your vagina.  You have spotting or bleeding from your vagina.  You have severe abdominal cramping or pain.  You have rapid weight gain or loss.  You vomit blood or material that looks like coffee grounds.  You are exposed to [place] measles and have never had them.  You are exposed to fifth disease or chickenpox.  You develop a severe headache.  You have shortness of breath.  You have any kind of trauma, such as from a fall or a car accident. Document Released: 05/20/2001 Document Revised: 10/10/2013 Document Reviewed: 04/05/2013 [facility] Patient Information 2015 [place], [place]. This information is not intended to replace advice given to you by your health care provider. Make sure you discuss any questions you have with your health care provider.    Pregnancy and Smoking Smoking during pregnancy is unhealthy for you and your developing baby. The addictive drug nicotine, carbon  monoxide, and many other poisons are inhaled from a cigarette and carried through your bloodstream to your baby. Cigarette smoke contains more than 2,500 chemicals. It is not known which of these are harmful to a developing baby. However, both nicotine and carbon monoxide play a role in causing health problems in pregnancy. Smoking during pregnancy increases the risk of:  Birth defects in your baby, including heart defects.  Miscarriage and stillbirth.  Birth before 37 completed weeks of pregnancy (premature birth).  Pregnancy outside of the uterus (tubal pregnancy).  Attachment of the placenta over the opening of the uterus (placenta previa).  Detachment of the placenta before the baby's birth (placental abruption).  Breaking of the bag of waters before labor begins (premature rupture of membranes). HOW DOES SMOKING DURING PREGNANCY AFFECT MY BABY? Before Birth Smoking during pregnancy:  Decreases blood flow and oxygen to your baby.  Increases the heart rate of your baby.  Slows your baby's growth in the uterus (intrauterine growth retardation). After Birth Babies born to women who smoke during pregnancy are more likely to have a low birth weight. They are also at risk for:  Serious health problems, chronic or lifelong disabilities (cerebral palsy, mental retardation, learning problems), and death.  Sudden infant death syndrome (SIDS).  Lung and breathing problems. WHAT RESOURCES ARE AVAILABLE TO HELP ME STOP SMOKING?  Ask your health care provider for help to stop smoking. The following resources are available:  Counseling.  Psychological treatment.  Acupuncture.  Family intervention.  Hypnosis.  Nicotine supplements have not been studied enough to know if they are safe to use during pregnancy. They should only be considered when all other methods fail, and if used under the close supervision of your health care provider.  Telephone QUIT lines. The national smoking  cessation telephone hotline number is 1-800-QUIT NOW. FOR MORE INFORMATION  American Cancer Society: www.cancer.org  American Heart Association: www.heart.org  National Cancer Institute: www.cancer.gov  March of Dimes: www.marchofdimes.org Document Released: 10/07/2004 Document Revised: 05/31/2013 Document Reviewed: 04/25/2013 [facility] Patient Information 2015 [place], [place]. This information is not intended to replace advice given to you by your health care provider. Make sure you discuss any questions you have with your health care provider.    Chest Pain (Nonspecific) It is often hard to give a specific diagnosis for the cause of chest pain. There is always a chance that your pain could be related to something serious, such as a heart attack or a blood clot in the lungs. You need to follow up with your health care provider for further evaluation. CAUSES   Heartburn.  Pneumonia or bronchitis.  Anxiety or stress.  Inflammation around your heart (pericarditis) or lung (pleuritis or pleurisy).  A blood clot in the lung.  A collapsed lung (pneumothorax). It can develop suddenly on its own (spontaneous pneumothorax) or from trauma to the chest.  Shingles infection (herpes zoster virus). The chest wall is composed of bones, muscles, and cartilage. Any of these can be the source of the pain.  The bones can be bruised by injury.  The muscles or cartilage can be strained by coughing or overwork.  The cartilage can be affected by inflammation and become sore (costochondritis). DIAGNOSIS  Lab tests or other studies may be needed to find the cause of your pain. Your health care provider may have you take a test called an ambulatory electrocardiogram (ECG). An ECG records your heartbeat patterns over a 24-hour period. You may also have other tests, such as:  Transthoracic echocardiogram (TTE). During echocardiography, sound waves are used to evaluate how blood flows through your  heart.  Transesophageal echocardiogram (TEE).  Cardiac monitoring. This allows your health care provider to monitor your heart rate and rhythm in real time.  Holter monitor. This is a portable device that records your heartbeat and can help diagnose heart arrhythmias. It allows your health care provider to track your heart activity for several days, if needed.  Stress tests by exercise or by giving medicine that makes the heart beat faster. TREATMENT  Treatment depends on what may be causing your chest pain. Treatment may include:  Acid blockers for heartburn.  Anti-inflammatory medicine.  Pain medicine for inflammatory conditions.  Antibiotics if an infection is present.  You may be advised to change lifestyle habits. This includes stopping smoking and avoiding alcohol, caffeine, and chocolate.  You may be advised to keep your head raised (elevated) when sleeping. This reduces the chance of acid going backward from your stomach into your esophagus. Most of the time, nonspecific chest pain will improve within 2-3 days with rest and mild pain medicine.  HOME CARE INSTRUCTIONS   If antibiotics were prescribed, take them as directed. Finish them even if you start to feel better.  For the next few days, avoid physical activities that bring on chest pain. Continue physical activities as directed.  Do not use any tobacco products, including cigarettes, chewing tobacco, or electronic cigarettes.  Avoid drinking alcohol.  Only take medicine as directed by your health care provider.  Follow your health care provider's suggestions for further testing if your chest pain does not go away.  Keep any follow-up appointments you made. If you do not go to an appointment, you could develop lasting (chronic) problems with pain. If there is any problem keeping an appointment, call to reschedule. SEEK MEDICAL CARE IF:   Your chest pain does not go away, even after treatment.  You have a rash  with blisters on your chest.  You have a fever. SEEK IMMEDIATE MEDICAL CARE IF:   You have increased chest pain or pain that spreads to your arm, neck, jaw, back, or abdomen.  You have shortness of breath.  You have an increasing cough, or you cough up blood.  You have severe back or abdominal pain.  You feel nauseous or vomit.  You have severe weakness.  You faint.  You have chills. This is an emergency. Do not wait to see if the pain will go away. Get medical help at once. Call your local emergency services (911 in [place] Do not drive yourself to the hospital. MAKE SURE YOU:   Understand these instructions.  Will watch your condition.  Will get help right away if you are not doing well or get worse. Document Released: 03/05/2005 Document Revised: 05/31/2013 Document Reviewed: 12/30/2007 [facility] Patient Information 2015 [place]. This information is not intended to replace advice given to you by your health care provider. Make sure you discuss any questions you have with your health care provider.

## 2014-06-08 NOTE — ED Notes (Addendum)
Last period around the 7th of November, hasn't had one since. Patient has not vomited just gagged. Complaining of a sore chest, breast are enlarged and hurting. Patient took pregnancy test on Dec 28th and states it was positive, but would like to get another pregnancy test done here.

## 2014-06-08 NOTE — ED Provider Notes (Signed)
CSN: [id number]     Arrival date & time 06/08/14  1427 History   First MD Initiated Contact with Patient 06/08/14 1508     Chief Complaint  Patient presents with  . Chest Pain  . Nausea     (Consider location/radiation/quality/duration/timing/severity/associated sxs/prior Treatment) Patient is a 38 y.o. female presenting with chest pain. The history is provided by the patient.  Chest Pain Associated symptoms: no abdominal pain, no back pain, no cough, no fever, no headache, no shortness of breath and not vomiting   pt states her last period was the 2nd week in November, and that she had home pos preg test end of December, and wants to confirm whether pregnant. She has noted recent nausea. No vomiting. G3p2, pt has a 39 yo and 39 yo. Pt denies any vaginal discharge or bleeding. No abd or pelvic pain. No back pain. Pt also states mild chest soreness for past 2 days. Constant. At rest. No relation to position, activity, or exertion. No associated sob or diaphoresis. Pt notes hx gerd. No pleuritic pain. No leg pain or swelling. No personal or fam hx cad or dvt/pe. No recent surgery, immobility, trauma or travel. No chest wall injury. Chest soreness worse w movement arms/shoulder, and states breast also seem a bit larger. No focal breast pain or focal swelling. Denies fever or chills. No cough or uri c/o.      Past Medical History  Diagnosis Date  . H/O drug abuse     crack, clean for 3 months  . Trichomonas   . GERD (gastroesophageal reflux disease)   . Hypertension    Past Surgical History  Procedure Laterality Date  . No past surgeries    . Cesarean section  03/19/2012    Procedure: CESAREAN SECTION;  Surgeon: Antionette CharLisa Jackson-Moore, MD;  Location: [facility];  Service: Obstetrics;  Laterality: N/A;   Family History  Problem Relation Age of Onset  . Anesthesia problems Neg Hx   . Hypotension Neg Hx   . Malignant hyperthermia Neg Hx   . Pseudochol deficiency Neg Hx    History  Substance  Use Topics  . Smoking status: Current Every Day Smoker -- 0.01 packs/day    Types: Cigarettes  . Smokeless tobacco: Never Used  . Alcohol Use: No   OB History    Gravida Para Term Preterm AB TAB SAB Ectopic Multiple Living   4 2 2  0 2 0 2 0 0 2     Review of Systems  Constitutional: Negative for fever and chills.  HENT: Negative for sore throat.   Eyes: Negative for redness.  Respiratory: Negative for cough and shortness of breath.   Cardiovascular: Positive for chest pain. Negative for leg swelling.  Gastrointestinal: Negative for vomiting, abdominal pain and diarrhea.  Genitourinary: Negative for flank pain, vaginal bleeding and vaginal discharge.  Musculoskeletal: Negative for back pain and neck pain.  Skin: Negative for rash.  Neurological: Negative for headaches.  Hematological: Does not bruise/bleed easily.  Psychiatric/Behavioral: Negative for confusion.      Allergies  Review of patient's allergies indicates no known allergies.  Home Medications   Prior to Admission medications   Medication Sig Start Date End Date Taking? Authorizing Provider  citalopram (CELEXA) 20 MG tablet Take 20 mg by mouth daily.    Historical Provider, MD  dexamethasone (DECADRON) 6 MG tablet Take 1 tablet (6 mg total) by mouth daily. For 3 days starting 6/19/'15 11/24/13   Toy CookeyMegan Docherty, MD  loratadine (CLARITIN) 10  MG tablet Take 1 tablet (10 mg total) by mouth daily. One po daily 11/24/13   Toy CookeyMegan Docherty, MD  naproxen sodium (ANAPROX) 550 MG tablet Take 550 mg by mouth 2 (two) times daily with a meal.    Historical Provider, MD  QUEtiapine (SEROQUEL XR) 50 MG TB24 24 hr tablet Take 50 mg by mouth.    Historical Provider, MD  QUEtiapine Fumarate (SEROQUEL XR) 150 MG 24 hr tablet Take 150 mg by mouth at bedtime.    Historical Provider, MD   BP 126/75 mmHg  Pulse 73  Temp(Src) 97.5 F (36.4 C) (Oral)  Resp 18  SpO2 100%  LMP 04/16/2014 Physical Exam  Constitutional: She appears  well-developed and well-nourished. No distress.  HENT:  Mouth/Throat: Oropharynx is clear and moist.  Eyes: Conjunctivae are normal. No scleral icterus.  Neck: Neck supple. No tracheal deviation present.  Cardiovascular: Normal rate, regular rhythm, normal heart sounds and intact distal pulses.  Exam reveals no gallop and no friction rub.   No murmur heard. Pulmonary/Chest: Effort normal and breath sounds normal. No respiratory distress. She exhibits tenderness.  Abdominal: Soft. Normal appearance and bowel sounds are normal. She exhibits no distension and no mass. There is no tenderness. There is no rebound and no guarding.  Genitourinary:  No cva tenderness  Musculoskeletal: She exhibits no edema or tenderness.  No calf or leg pain, swelling, or tenderness.   Neurological: She is alert.  Skin: Skin is warm and dry. No rash noted. She is not diaphoretic.  Psychiatric: She has a normal mood and affect.  Nursing note and vitals reviewed.   ED Course  Procedures (including critical care time) Labs Review  Results for orders placed or performed during the hospital encounter of 06/08/14  POC urine preg, [facility])  Result Value Ref Range   Preg Test, Ur POSITIVE (A) NEGATIVE      EKG Interpretation   Date/Time:  Thursday June 08 2014 14:40:45 EST Ventricular Rate:  75 PR Interval:  140 QRS Duration: 91 QT Interval:  391 QTC Calculation: 437 R Axis:   15 Text Interpretation:  Sinus rhythm No significant change since last  tracing Confirmed by WARD,  DO, KRISTEN ([id number]) on 06/08/2014 2:57:42 PM      MDM   Labs.  Reviewed nursing notes and prior charts for additional history.   Pt states had come to verify if was pregnant. u preg pos.  No abd pain. No vaginal discharge or bleeding. lnmp second week in November.  abd soft nt.  Discussed need ob/gyn follow up.  Pt appears stable for d/c.   Discussed need close ob/gyn follow up and return precautions.       Suzi RootsKevin E Samie Reasons, MD 06/08/14 [phone]

## 2014-06-08 NOTE — Progress Notes (Signed)
  CARE MANAGEMENT ED NOTE 06/08/2014  Patient:  Lindsey [name],Lindsey [name]   Account Number:  [id number]  Date Initiated:  06/08/2014  Documentation initiated by:  Radford PaxFERRERO,Markie Heffernan  Subjective/Objective Assessment:   Patient presents to ED to confirm if she is pregnant, positive nausea     Subjective/Objective Assessment Detail:   Patient with pmhx of drug abuse, Trichomonas, GERD, HTN. Patient's pregnancy test was positive.     Action/Plan:   Action/Plan Detail:   Anticipated DC Date:  06/08/2014     Status Recommendation to Physician:   Result of Recommendation:    Other ED Services  Consult Working Plan    DC Planning Services  Other  PCP issues    Choice offered to / List presented to:            Status of service:  Completed, signed off  ED Comments:   ED Comments Detail:  Patient [place]  EDCm spoke to patient at bedside. Patient eports she no longer has Medicaid but will be applying because she is pregnant.  [facility] a piece of paper which was a Ship brokercharity letter [facility]. [facility] in[facility]. EDCM encourgaed patient to call phone number on charity letter to assist he in finding a pcp [facility]. Patient thankful for services.  No further EDCM needs at this time.

## 2014-06-09 NOTE — L&D Delivery Note (Signed)
Expand All Collapse All   Delivery Note  First Stage: Labor onset: 1800 Augmentation : None Analgesia /Anesthesia intrapartum: None SROM ROM at 0004 on 12/31/14  Second Stage: Complete dilation at 0008 Onset of pushing at 0008 FHR second stage: unable to monitor  Delivery of a viable infant girl at 0014 by [name], CNM in LOA position No nuchal cord Cord immediately cut and clamped at delivery and baby immediately transported to the warmer.  Cord blood sample collected   Third Stage: Placenta delivered shultz presentation intact with 3 VC @ 0019 Placenta disposition: pathology Uterine tone firm / bleeding moderate  No laceration identified  Est. Blood Loss (mL): 300  Complications: Patient arrived to Unit complaining of pain, kicking legs and refusing EFM, toco and IV access, despite nurses discussion of the benefits of EFM, toco and IV access. [name] CNM immediately at the bedside; Dr. Debroah Loop called to the bedside, NICU team present in the room. At 0014 delivery of a viable infant girl in LOA position. After placenta delivered at 0019, 10 units of pitocin were given IM in right thigh.  Patient's perineum intact. Patient's GBS status was unknown; infant was untreated. Patient stated that she had been using crack cocaine last night; admitted to drug use. Patient had signed BLT paperwork but because of current drug use and Haldol will defer consent for BTL at this time.   Mom to postpartum. Baby to NICU.  Newborn: Birth Weight: 5 lbs, 1 oz.  Apgar Scores: 5/7/7 Feeding planned: breast  [name] [facility] 12/31/2014, 2:37 AM  I performed the entire delivery of baby and was present for delivery of placenta and inspection of perineum and agree with above.  [name], CNM 01/02/2015 9:29 AM       .

## 2014-06-12 ENCOUNTER — Other Ambulatory Visit: Payer: Self-pay | Admitting: *Deleted

## 2014-06-12 DIAGNOSIS — Z3491 Encounter for supervision of normal pregnancy, unspecified, first trimester: Secondary | ICD-10-CM

## 2014-06-20 ENCOUNTER — Other Ambulatory Visit: Payer: Self-pay | Admitting: Obstetrics & Gynecology

## 2014-06-20 DIAGNOSIS — Z3491 Encounter for supervision of normal pregnancy, unspecified, first trimester: Secondary | ICD-10-CM

## 2014-06-21 ENCOUNTER — Encounter (HOSPITAL_COMMUNITY): Payer: Self-pay | Admitting: *Deleted

## 2014-06-21 ENCOUNTER — Ambulatory Visit (HOSPITAL_COMMUNITY)
Admission: RE | Admit: 2014-06-21 | Discharge: 2014-06-21 | Disposition: A | Discharge status: Home / Self Care | Payer: Medicaid Other | Source: Ambulatory Visit | Attending: Obstetrics & Gynecology | Admitting: Obstetrics & Gynecology

## 2014-06-21 ENCOUNTER — Inpatient Hospital Stay (HOSPITAL_COMMUNITY)
Admission: AD | Admit: 2014-06-21 | Discharge: 2014-06-21 | Disposition: A | Discharge status: Home / Self Care | Payer: Medicaid Other | Source: Ambulatory Visit | Attending: Obstetrics and Gynecology | Admitting: Obstetrics and Gynecology

## 2014-06-21 ENCOUNTER — Encounter: Payer: Self-pay | Admitting: Obstetrics & Gynecology

## 2014-06-21 DIAGNOSIS — O23591 Infection of other part of genital tract in pregnancy, first trimester: Secondary | ICD-10-CM

## 2014-06-21 DIAGNOSIS — Z3A09 9 weeks gestation of pregnancy: Secondary | ICD-10-CM | POA: Insufficient documentation

## 2014-06-21 DIAGNOSIS — O99331 Smoking (tobacco) complicating pregnancy, first trimester: Secondary | ICD-10-CM | POA: Diagnosis not present

## 2014-06-21 DIAGNOSIS — O99711 Diseases of the skin and subcutaneous tissue complicating pregnancy, first trimester: Secondary | ICD-10-CM | POA: Insufficient documentation

## 2014-06-21 DIAGNOSIS — Z3491 Encounter for supervision of normal pregnancy, unspecified, first trimester: Secondary | ICD-10-CM | POA: Diagnosis present

## 2014-06-21 DIAGNOSIS — A5901 Trichomonal vulvovaginitis: Secondary | ICD-10-CM

## 2014-06-21 DIAGNOSIS — L299 Pruritus, unspecified: Secondary | ICD-10-CM | POA: Insufficient documentation

## 2014-06-21 DIAGNOSIS — F17210 Nicotine dependence, cigarettes, uncomplicated: Secondary | ICD-10-CM | POA: Diagnosis not present

## 2014-06-21 DIAGNOSIS — O98311 Other infections with a predominantly sexual mode of transmission complicating pregnancy, first trimester: Secondary | ICD-10-CM | POA: Insufficient documentation

## 2014-06-21 DIAGNOSIS — N760 Acute vaginitis: Secondary | ICD-10-CM | POA: Diagnosis not present

## 2014-06-21 DIAGNOSIS — A599 Trichomoniasis, unspecified: Secondary | ICD-10-CM

## 2014-06-21 DIAGNOSIS — B9689 Other specified bacterial agents as the cause of diseases classified elsewhere: Secondary | ICD-10-CM

## 2014-06-21 DIAGNOSIS — N898 Other specified noninflammatory disorders of vagina: Secondary | ICD-10-CM | POA: Diagnosis present

## 2014-06-21 LAB — URINALYSIS, ROUTINE W REFLEX MICROSCOPIC
Bilirubin Urine: NEGATIVE
GLUCOSE, UA: NEGATIVE mg/dL
Hgb urine dipstick: NEGATIVE
KETONES UR: NEGATIVE mg/dL
Nitrite: NEGATIVE
Protein, ur: NEGATIVE mg/dL
Specific Gravity, Urine: 1.02 (ref 1.005–1.030)
Urobilinogen, UA: 0.2 mg/dL (ref 0.0–1.0)
pH: 7 (ref 5.0–8.0)

## 2014-06-21 LAB — CBC
HEMATOCRIT: 36 % (ref 36.0–46.0)
HEMOGLOBIN: 12.3 g/dL (ref 12.0–15.0)
MCH: 30.8 pg (ref 26.0–34.0)
MCHC: 34.2 g/dL (ref 30.0–36.0)
MCV: 90.2 fL (ref 78.0–100.0)
Platelets: 210 10*3/uL (ref 150–400)
RBC: 3.99 MIL/uL (ref 3.87–5.11)
RDW: 14.6 % (ref 11.5–15.5)
WBC: 7.6 10*3/uL (ref 4.0–10.5)

## 2014-06-21 LAB — WET PREP, GENITAL: YEAST WET PREP: NONE SEEN

## 2014-06-21 LAB — URINE MICROSCOPIC-ADD ON

## 2014-06-21 MED ORDER — METRONIDAZOLE 500 MG PO TABS: 500.0000 mg | ORAL_TABLET | Freq: Two times a day (BID) | ORAL | Status: DC

## 2014-06-21 MED ORDER — METRONIDAZOLE 500 MG PO TABS
500.0000 mg | ORAL_TABLET | Freq: Two times a day (BID) | ORAL | Status: DC
Start: 2014-06-21 — End: 2014-06-21

## 2014-06-21 NOTE — MAU Note (Deleted)
Saw pt alk out the door

## 2014-06-21 NOTE — Discharge Instructions (Signed)
Bacterial Vaginosis °Bacterial vaginosis is a vaginal infection that occurs when the normal balance of bacteria in the vagina is disrupted. It results from an overgrowth of certain bacteria. This is the most common vaginal infection in women of childbearing age. Treatment is important to prevent complications, especially in pregnant women, as it can cause a premature delivery. °CAUSES  °Bacterial vaginosis is caused by an increase in harmful bacteria that are normally present in smaller amounts in the vagina. Several different kinds of bacteria can cause bacterial vaginosis. However, the reason that the condition develops is not fully understood. °RISK FACTORS °Certain activities or behaviors can put you at an increased risk of developing bacterial vaginosis, including: °· Having a new sex partner or multiple sex partners. °· Douching. °· Using an intrauterine device (IUD) for contraception. °Women do not get bacterial vaginosis from toilet seats, bedding, swimming pools, or contact with objects around them. °SIGNS AND SYMPTOMS  °Some women with bacterial vaginosis have no signs or symptoms. Common symptoms include: °· Grey vaginal discharge. °· A fishlike odor with discharge, especially after sexual intercourse. °· Itching or burning of the vagina and vulva. °· Burning or pain with urination. °DIAGNOSIS  °Your health care provider will take a medical history and examine the vagina for signs of bacterial vaginosis. A sample of vaginal fluid may be taken. Your health care provider will look at this sample under a microscope to check for bacteria and abnormal cells. A vaginal pH test may also be done.  °TREATMENT  °Bacterial vaginosis may be treated with antibiotic medicines. These may be given in the form of a pill or a vaginal cream. A second round of antibiotics may be prescribed if the condition comes back after treatment.  °HOME CARE INSTRUCTIONS  °· Only take over-the-counter or prescription medicines as  directed by your health care provider. °· If antibiotic medicine was prescribed, take it as directed. Make sure you finish it even if you start to feel better. °· Do not have sex until treatment is completed. °· Tell all sexual partners that you have a vaginal infection. They should see their health care provider and be treated if they have problems, such as a mild rash or itching. °· Practice safe sex by using condoms and only having one sex partner. °SEEK MEDICAL CARE IF:  °· Your symptoms are not improving after 3 days of treatment. °· You have increased discharge or pain. °· You have a fever. °MAKE SURE YOU:  °· Understand these instructions. °· Will watch your condition. °· Will get help right away if you are not doing well or get worse. °FOR MORE INFORMATION  °Centers for Disease Control and Prevention, Division of STD Prevention: www.cdc.gov/std °American Sexual Health Association (ASHA): www.ashastd.org  °Document Released: 05/26/2005 Document Revised: 03/16/2013 Document Reviewed: 01/05/2013 °ExitCare® Patient Information ©2015 [employer]. This information is not intended to replace advice given to you by your health care provider. Make sure you discuss any questions you have with your health care provider. °Trichomoniasis °Trichomoniasis is an infection caused by an organism called Trichomonas. The infection can affect both women and men. In women, the outer female genitalia and the vagina are affected. In men, the penis is mainly affected, but the prostate and other reproductive organs can also be involved. Trichomoniasis is a sexually transmitted infection (STI) and is most often passed to another person through sexual contact.  °RISK FACTORS °· Having unprotected sexual intercourse. °· Having sexual intercourse with an infected partner. °SIGNS AND SYMPTOMS  °  Symptoms of trichomoniasis in women include: °· Abnormal gray-green frothy vaginal discharge. °· Itching and irritation of the  vagina. °· Itching and irritation of the area outside the vagina. °Symptoms of trichomoniasis in men include:  °· Penile discharge with or without pain. °· Pain during urination. This results from inflammation of the urethra. °DIAGNOSIS  °Trichomoniasis may be found during a Pap test or physical exam. Your health care provider may use one of the following methods to help diagnose this infection: °· Examining vaginal discharge under a microscope. For men, urethral discharge would be examined. °· Testing the pH of the vagina with a test tape. °· Using a vaginal swab test that checks for the Trichomonas organism. A test is available that provides results within a few minutes. °· Doing a culture test for the organism. This is not usually needed. °TREATMENT  °· You may be given medicine to fight the infection. Women should inform their health care provider if they could be or are pregnant. Some medicines used to treat the infection should not be taken during pregnancy. °· Your health care provider may recommend over-the-counter medicines or creams to decrease itching or irritation. °· Your sexual partner will need to be treated if infected. °HOME CARE INSTRUCTIONS  °· Take medicines only as directed by your health care provider. °· Take over-the-counter medicine for itching or irritation as directed by your health care provider. °· Do not have sexual intercourse while you have the infection. °· Women should not douche or wear tampons while they have the infection. °· Discuss your infection with your partner. Your partner may have gotten the infection from you, or you may have gotten it from your partner. °· Have your sex partner get examined and treated if necessary. °· Practice safe, informed, and protected sex. °· See your health care provider for other STI testing. °SEEK MEDICAL CARE IF:  °· You still have symptoms after you finish your medicine. °· You develop abdominal pain. °· You have pain when you urinate. °· You  have bleeding after sexual intercourse. °· You develop a rash. °· Your medicine makes you sick or makes you throw up (vomit). °MAKE SURE YOU: °· Understand these instructions. °· Will watch your condition. °· Will get help right away if you are not doing well or get worse. °Document Released: 11/19/2000 Document Revised: 10/10/2013 Document Reviewed: 03/07/2013 °ExitCare® Patient Information ©2015 [employer]. This information is not intended to replace advice given to you by your health care provider. Make sure you discuss any questions you have with your health care provider. ° °

## 2014-06-21 NOTE — MAU Provider Note (Signed)
History     CSN: [id number]  Arrival date and time: 06/21/14 1219   First Provider Initiated Contact with Patient 06/21/14 1614      No chief complaint on file. CC: "itching all over"  HPI    FB is a  39 y.o. [id number] @ [redacted]w[redacted]d presenting with pruritis and vaginal discharge. Pruritis is on the upper body (arms and back) and started approximately 3 weeks ago and has progressively worsened. Benadryl and lotion do not help. Denies exposure to poison ivy or poison oak, has not changed laundry detergent. She did change soap, but that change came after the itching started. Denies rash, blisters, fever.   Vaginal discharge started approximately 2 weeks ago. Patient says there is enough discharge that she has to wear a pad that she changes every 2 hours, pad is described as "wet." Denies bloody discharge, odor, dysuria, pelvic pain, and contractions.  Pt is not taking Celexa or Seroquel at present.   OB History    Gravida Para Term Preterm AB TAB SAB Ectopic Multiple Living   0 2 0 2 0 0 2      Past Medical History  Diagnosis Date  . H/O drug abuse     crack, clean for 3 months  . Trichomonas   . GERD (gastroesophageal reflux disease)   . Hypertension     Past Surgical History  Procedure Laterality Date  . No past surgeries    . Cesarean section  03/19/2012    Procedure: CESAREAN SECTION;  Surgeon: Antionette Char, MD;  Location: [facility];  Service: Obstetrics;  Laterality: N/A;    Family History  Problem Relation Age of Onset  . Anesthesia problems Neg Hx   . Hypotension Neg Hx   . Malignant hyperthermia Neg Hx   . Pseudochol deficiency Neg Hx     History  Substance Use Topics  . Smoking status: Current Every Day Smoker -- 0.01 packs/day    Types: Cigarettes  . Smokeless tobacco: Never Used  . Alcohol Use: No    Allergies: No Known Allergies  Prescriptions prior to admission  Medication Sig Dispense Refill Last Dose  . citalopram (CELEXA) 20 MG tablet Take  20 mg by mouth daily.   06/08/2014 at Unknown time  . dexamethasone (DECADRON) 6 MG tablet Take 1 tablet (6 mg total) by mouth daily. For 3 days starting 6/19/'15 (Patient not taking: Reported on 06/08/2014) 3 tablet 0   . loratadine (CLARITIN) 10 MG tablet Take 1 tablet (10 mg total) by mouth daily. One po daily (Patient not taking: Reported on 06/08/2014) 30 tablet 0   . naproxen sodium (ANAPROX) 550 MG tablet Take 550 mg by mouth 2 (two) times daily with a meal.     . QUEtiapine (SEROQUEL XR) 50 MG TB24 24 hr tablet Take 50 mg by mouth.     . QUEtiapine (SEROQUEL) 300 MG tablet Take 300 mg by mouth at bedtime.   06/07/2014 at Unknown time  . QUEtiapine Fumarate (SEROQUEL XR) 150 MG 24 hr tablet Take 150 mg by mouth at bedtime.       Review of Systems  Constitutional: Negative for fever and chills.  Eyes: Negative for blurred vision.  Respiratory: Negative for cough and shortness of breath.   Cardiovascular: Positive for palpitations (Sitting relieves. H/o anxiety). Negative for chest pain.  Gastrointestinal: Positive for constipation. Negative for nausea, vomiting, abdominal pain and blood in stool.  Genitourinary: Negative for dysuria and hematuria.  Skin: Positive for  itching (Arms and back). Negative for rash.  Neurological: Positive for headaches (Taking ibuprofen. ).   Physical Exam   Blood pressure 141/76, pulse 75, temperature 97.9 F (36.6 C), temperature source Oral, resp. rate 16, weight 65.772 kg (145 lb), last menstrual period 04/16/2014, SpO2 98 %.  Physical Exam  Constitutional: She is oriented to person, place, and time. She appears well-developed and well-nourished.  HENT:  Head: Normocephalic and atraumatic.  Neck: Normal range of motion.  Cardiovascular: Normal rate, regular rhythm, normal heart sounds and intact distal pulses.   Respiratory: Effort normal and breath sounds normal. No respiratory distress.  Genitourinary:  Mod to large amt of white homogenous  discharge.   No CMT/no adnexal mass or tenderness  Musculoskeletal: Normal range of motion.  Neurological: She is alert and oriented to person, place, and time.  Skin: Skin is warm and dry.   Results for orders placed or performed during the hospital encounter of 06/21/14 (from the past 24 hour(s))  Urinalysis, Routine w reflex microscopic     Status: Abnormal   Collection Time: 06/21/14 12:35 PM  Result Value Ref Range   Color, Urine YELLOW YELLOW   APPearance HAZY (A) CLEAR   Specific Gravity, Urine 1.020 1.005 - 1.030   pH 7.0 5.0 - 8.0   Glucose, UA NEGATIVE NEGATIVE mg/dL   Hgb urine dipstick NEGATIVE NEGATIVE   Bilirubin Urine NEGATIVE NEGATIVE   Ketones, ur NEGATIVE NEGATIVE mg/dL   Protein, ur NEGATIVE NEGATIVE mg/dL   Urobilinogen, UA 0.2 0.0 - 1.0 mg/dL   Nitrite NEGATIVE NEGATIVE   Leukocytes, UA TRACE (A) NEGATIVE  Urine microscopic-add on     Status: Abnormal   Collection Time: 06/21/14 12:35 PM  Result Value Ref Range   Squamous Epithelial / LPF FEW (A) RARE   WBC, UA 7-10 <3 WBC/hpf   RBC / HPF 3-6 <3 RBC/hpf   Bacteria, UA MANY (A) RARE   Urine-Other MUCOUS PRESENT   Wet prep, genital     Status: Abnormal   Collection Time: 06/21/14  3:59 PM  Result Value Ref Range   Yeast Wet Prep HPF POC NONE SEEN NONE SEEN   Trich, Wet Prep FEW (A) NONE SEEN   Clue Cells Wet Prep HPF POC MODERATE (A) NONE SEEN   WBC, Wet Prep HPF POC FEW (A) NONE SEEN  CBC     Status: None   Collection Time: 06/21/14  4:08 PM  Result Value Ref Range   WBC 7.6 4.0 - 10.5 K/uL   RBC 3.99 3.87 - 5.11 MIL/uL   Hemoglobin 12.3 12.0 - 15.0 g/dL   HCT 45.436.0 09.836.0 - 11.946.0 %   MCV 90.2 78.0 - 100.0 fL   MCH 30.8 26.0 - 34.0 pg   MCHC 34.2 30.0 - 36.0 g/dL   RDW 14.714.6 82.911.5 - 56.215.5 %   Platelets 210 150 - 400 K/uL    MAU Course  Procedures  MDM Completed a speculum exam>>Ordered CBC, GC/Chlamydia, wet prep. Trichomonas present on wet prep which is also consistent with BV. No rash/objective  finding for itching.  CBC is negative.  Assessment and Plan  Assessment 1. SIUP 4060w3d 2. Trichomonas 3. Bacterial vaginosis 4. Bacterial vaginitis in pregnancy, first trimester 5. Pruritis  Plan 1. Discharge home 2. Prescribe Metronidazole 500mg  BID x 7 days. Do not drink alcohol or have sexual intercourse while on medication. Inform sexual partners of diagnosis.  Partners to be treated.  Info for HD given. 3. SARNA lotion for pruritis. If  worsens, return to MAU. Otherwise follow up with PCP.  4. Tylenol only for treatment of headaches.  Do not use ibuprofen/NSAIDS 5. Follow up ASAP for routine prenatal care.    Arvilla Meres L PA-S 06/21/2014, 4:17 PM   I have seen and evaluated the patient with the PA student. I agree with the assessment and plan as written above.   Bertram Denver, PA-C  06/21/2014 5:18 PM

## 2014-06-21 NOTE — MAU Note (Signed)
Skin is irritated, new problem since preg. Very sensitive skin; 'welping up and itching all over'.When has discharge, it irritates on her thighs.

## 2014-06-22 LAB — GC/CHLAMYDIA PROBE AMP (~~LOC~~) NOT AT ARMC
Chlamydia: NEGATIVE
Neisseria Gonorrhea: NEGATIVE

## 2014-06-22 LAB — HIV ANTIBODY (ROUTINE TESTING W REFLEX)
HIV 1/HIV 2 AB: NONREACTIVE
HIV 1/O/2 Abs-Index Value: <1 (ref <1.00)

## 2014-06-28 ENCOUNTER — Inpatient Hospital Stay (HOSPITAL_COMMUNITY)
Admission: AD | Admit: 2014-06-28 | Discharge: 2014-06-28 | Disposition: A | Discharge status: Home / Self Care | Payer: Medicaid Other | Source: Ambulatory Visit | Attending: Obstetrics | Admitting: Obstetrics

## 2014-06-28 ENCOUNTER — Encounter (HOSPITAL_COMMUNITY): Payer: Self-pay

## 2014-06-28 DIAGNOSIS — Z87891 Personal history of nicotine dependence: Secondary | ICD-10-CM | POA: Insufficient documentation

## 2014-06-28 DIAGNOSIS — Z3A10 10 weeks gestation of pregnancy: Secondary | ICD-10-CM | POA: Insufficient documentation

## 2014-06-28 DIAGNOSIS — J069 Acute upper respiratory infection, unspecified: Secondary | ICD-10-CM | POA: Insufficient documentation

## 2014-06-28 DIAGNOSIS — O99511 Diseases of the respiratory system complicating pregnancy, first trimester: Secondary | ICD-10-CM | POA: Diagnosis not present

## 2014-06-28 DIAGNOSIS — R109 Unspecified abdominal pain: Secondary | ICD-10-CM | POA: Diagnosis present

## 2014-06-28 HISTORY — DX: Gestational (pregnancy-induced) hypertension without significant proteinuria, unspecified trimester: O13.9

## 2014-06-28 LAB — CBC WITH DIFFERENTIAL/PLATELET
BASOS ABS: 0.1 10*3/uL (ref 0.0–0.1)
Basophils Relative: 1 % (ref 0–1)
Eosinophils Absolute: 0.2 10*3/uL (ref 0.0–0.7)
Eosinophils Relative: 2 % (ref 0–5)
HCT: 36.7 % (ref 36.0–46.0)
HEMOGLOBIN: 12.7 g/dL (ref 12.0–15.0)
LYMPHS ABS: 3.4 10*3/uL (ref 0.7–4.0)
LYMPHS PCT: 43 % (ref 12–46)
MCH: 31.1 pg (ref 26.0–34.0)
MCHC: 34.6 g/dL (ref 30.0–36.0)
MCV: 89.7 fL (ref 78.0–100.0)
MONOS PCT: 10 % (ref 3–12)
Monocytes Absolute: 0.7 10*3/uL (ref 0.1–1.0)
NEUTROS ABS: 3.4 10*3/uL (ref 1.7–7.7)
NEUTROS PCT: 44 % (ref 43–77)
Platelets: 205 10*3/uL (ref 150–400)
RBC: 4.09 MIL/uL (ref 3.87–5.11)
RDW: 14.3 % (ref 11.5–15.5)
WBC: 7.7 10*3/uL (ref 4.0–10.5)

## 2014-06-28 LAB — URINALYSIS, ROUTINE W REFLEX MICROSCOPIC
Bilirubin Urine: NEGATIVE
Glucose, UA: NEGATIVE mg/dL
Hgb urine dipstick: NEGATIVE
Ketones, ur: NEGATIVE mg/dL
LEUKOCYTES UA: NEGATIVE
Nitrite: NEGATIVE
PH: 7 (ref 5.0–8.0)
Protein, ur: NEGATIVE mg/dL
Specific Gravity, Urine: 1.02 (ref 1.005–1.030)
Urobilinogen, UA: 0.2 mg/dL (ref 0.0–1.0)

## 2014-06-28 LAB — COMPREHENSIVE METABOLIC PANEL
ALK PHOS: 35 U/L — AB (ref 39–117)
ALT: 16 U/L (ref 0–35)
AST: 20 U/L (ref 0–37)
Albumin: 3.8 g/dL (ref 3.5–5.2)
Anion gap: 4 — ABNORMAL LOW (ref 5–15)
BUN: 12 mg/dL (ref 6–23)
CALCIUM: 9.1 mg/dL (ref 8.4–10.5)
CO2: 25 mmol/L (ref 19–32)
Chloride: 106 mEq/L (ref 96–112)
Creatinine, Ser: 0.63 mg/dL (ref 0.50–1.10)
GLUCOSE: 88 mg/dL (ref 70–99)
POTASSIUM: 3.9 mmol/L (ref 3.5–5.1)
SODIUM: 135 mmol/L (ref 135–145)
Total Bilirubin: 0.3 mg/dL (ref 0.3–1.2)
Total Protein: 6.2 g/dL (ref 6.0–8.3)

## 2014-06-28 LAB — AMYLASE: AMYLASE: 78 U/L (ref 0–105)

## 2014-06-28 LAB — RAPID STREP SCREEN (MED CTR MEBANE ONLY): STREPTOCOCCUS, GROUP A SCREEN (DIRECT): NEGATIVE

## 2014-06-28 LAB — LIPASE, BLOOD: Lipase: 28 U/L (ref 11–59)

## 2014-06-28 NOTE — MAU Note (Signed)
Pt C/O bilateral side pain, sore throat, HA - started today.  Denies vag bleeding or discharge, no vomiting or diarrhea.

## 2014-06-28 NOTE — MAU Provider Note (Signed)
History     CSN: [id number]  Arrival date and time: 06/28/14 1816   First Provider Initiated Contact with Patient 06/28/14 2014      Chief Complaint  Patient presents with  . Headache  . Sore Throat   HPI  39 y.o. [id number] [redacted]w[redacted]d presents with 3 days of bilateral side shooting pain into upper abdomen, sore throat and nasal congestion.  Pt reports pain with PO intake but able to tolerate PO without difficulty. Pt reports nasal congestion with clear nasal drainage. Pt describes bilateral side pain as sharp, radiating into abdomen, intermittent, reports "It's not contractions though". Side pain worse when lying flat and on rt side. Pt denies cough, sob, fever/chills, n/v or constipation/diarrhea.  Pt denies smoking, reports second hand smoke exposure "at meetings". Pt refuses flu vaccine this year.   OB History    Gravida Para Term Preterm AB TAB SAB Ectopic Multiple Living   0 2 0 2 0 0 2      Past Medical History  Diagnosis Date  . H/O drug abuse     crack, clean for 3 months  . Trichomonas   . GERD (gastroesophageal reflux disease)   . Hypertension   . Pregnancy induced hypertension     Past Surgical History  Procedure Laterality Date  . Cesarean section  03/19/2012    Procedure: CESAREAN SECTION;  Surgeon: Antionette Char, MD;  Location: [facility];  Service: Obstetrics;  Laterality: N/A;    Family History  Problem Relation Age of Onset  . Anesthesia problems Neg Hx   . Hypotension Neg Hx   . Malignant hyperthermia Neg Hx   . Pseudochol deficiency Neg Hx     History  Substance Use Topics  . Smoking status: Former Smoker -- 0.01 packs/day    Types: Cigarettes    Quit date: 05/28/2014  . Smokeless tobacco: Never Used  . Alcohol Use: No    Allergies: No Known Allergies  No prescriptions prior to admission    Review of Systems  Constitutional: Negative for fever, chills and weight loss.  HENT: Positive for congestion and sore throat. Negative for ear  discharge, ear pain, hearing loss, nosebleeds and tinnitus.   Eyes: Negative for blurred vision and double vision.  Respiratory: Negative for cough, sputum production, shortness of breath and wheezing.   Cardiovascular: Negative for chest pain and palpitations.  Gastrointestinal: Negative for heartburn, nausea, vomiting, abdominal pain, diarrhea, constipation, blood in stool and melena.  Genitourinary: Negative for dysuria, urgency, frequency, hematuria and flank pain.  Endo/Heme/Allergies:       Denies vaginal discharge or bleeding   Physical Exam   Blood pressure 117/78, pulse 86, temperature 98.4 F (36.9 C), temperature source Oral, resp. rate 22, last menstrual period 04/16/2014.  Physical Exam  Constitutional: She is oriented to person, place, and time. She appears well-developed and well-nourished.  HENT:  Head: Normocephalic.  Right Ear: External ear normal.  Left Ear: External ear normal.  Mouth/Throat: Oropharyngeal exudate present.  Small round white exudate on rt tonsil. No tonsilar edema, postnasal drip. No lymphadenopathy.  Bil TM pearly gray, no inflammation or congestion.  Rt nasal turbinate edematous with clear drainage, no inflammation or purulent drainage.   Eyes: Pupils are equal, round, and reactive to light. Right eye exhibits no discharge. Left eye exhibits no discharge.  Neck: Normal range of motion. Neck supple. No JVD present. No tracheal deviation present. No thyromegaly present.  Cardiovascular: Normal rate.   Respiratory: Effort normal and  breath sounds normal. No respiratory distress. She has no wheezes.  GI: Soft. Bowel sounds are normal. She exhibits no distension and no mass. There is no tenderness. There is no rebound and no guarding.  Neurological: She is alert and oriented to person, place, and time.  Skin: Skin is warm and dry.    MAU Course  Procedures  MDM CBC, CMP, Amylase, Lipase, Rapid Strep Results for orders placed or performed during  the hospital encounter of 06/28/14 (from the past 24 hour(s))  Urinalysis, Routine w reflex microscopic     Status: None   Collection Time: 06/28/14  6:34 PM  Result Value Ref Range   Color, Urine YELLOW YELLOW   APPearance CLEAR CLEAR   Specific Gravity, Urine 1.020 1.005 - 1.030   pH 7.0 5.0 - 8.0   Glucose, UA NEGATIVE NEGATIVE mg/dL   Hgb urine dipstick NEGATIVE NEGATIVE   Bilirubin Urine NEGATIVE NEGATIVE   Ketones, ur NEGATIVE NEGATIVE mg/dL   Protein, ur NEGATIVE NEGATIVE mg/dL   Urobilinogen, UA 0.2 0.0 - 1.0 mg/dL   Nitrite NEGATIVE NEGATIVE   Leukocytes, UA NEGATIVE NEGATIVE  CBC with Differential     Status: None   Collection Time: 06/28/14  8:30 PM  Result Value Ref Range   WBC 7.7 4.0 - 10.5 K/uL   RBC 4.09 3.87 - 5.11 MIL/uL   Hemoglobin 12.7 12.0 - 15.0 g/dL   HCT 16.1 09.6 - 04.5 %   MCV 89.7 78.0 - 100.0 fL   MCH 31.1 26.0 - 34.0 pg   MCHC 34.6 30.0 - 36.0 g/dL   RDW 40.9 81.1 - 91.4 %   Platelets 205 150 - 400 K/uL   Neutrophils Relative % 44 43 - 77 %   Neutro Abs 3.4 1.7 - 7.7 K/uL   Lymphocytes Relative 43 12 - 46 %   Lymphs Abs 3.4 0.7 - 4.0 K/uL   Monocytes Relative 10 3 - 12 %   Monocytes Absolute 0.7 0.1 - 1.0 K/uL   Eosinophils Relative 2 0 - 5 %   Eosinophils Absolute 0.2 0.0 - 0.7 K/uL   Basophils Relative 1 0 - 1 %   Basophils Absolute 0.1 0.0 - 0.1 K/uL  Comprehensive metabolic panel     Status: Abnormal   Collection Time: 06/28/14  8:30 PM  Result Value Ref Range   Sodium 135 135 - 145 mmol/L   Potassium 3.9 3.5 - 5.1 mmol/L   Chloride 106 96 - 112 mEq/L   CO2 25 19 - 32 mmol/L   Glucose, Bld 88 70 - 99 mg/dL   BUN 12 6 - 23 mg/dL   Creatinine, Ser 7.82 0.50 - 1.10 mg/dL   Calcium 9.1 8.4 - 95.6 mg/dL   Total Protein 6.2 6.0 - 8.3 g/dL   Albumin 3.8 3.5 - 5.2 g/dL   AST 20 0 - 37 U/L   ALT 16 0 - 35 U/L   Alkaline Phosphatase 35 (L) 39 - 117 U/L   Total Bilirubin 0.3 0.3 - 1.2 mg/dL   GFR calc non Af Amer >90 >90 mL/min   GFR calc  Af Amer >90 >90 mL/min   Anion gap 4 (L) 5 - 15  Amylase     Status: None   Collection Time: 06/28/14  8:30 PM  Result Value Ref Range   Amylase 78 0 - 105 U/L  Lipase, blood     Status: None   Collection Time: 06/28/14  8:30 PM  Result Value Ref Range  Lipase 28 11 - 59 U/L  Rapid strep screen     Status: None   Collection Time: 06/28/14  8:35 PM  Result Value Ref Range   Streptococcus, Group A Screen (Direct) NEGATIVE NEGATIVE    Assessment and Plan  A: Viral Upper Respiratory Infection musculoskeletal abdominal pain  P:  Discharge Home Pt provided list of appropriate OTC meds during pregnancy F/U with [facility] as scheduled 07/11/14 to establish OB care Return if experiences increased pain, fever > 101.5, vaginal bleeding/discharge  Micker Samios, FNP-S  I have seen and evaluated the patient with the NP/PA/Med student. I agree with the assessment and plan as written above.   Marny LowensteinJulie N Diandre Merica, PA-C  06/28/2014 10:34 PM

## 2014-06-28 NOTE — MAU Note (Signed)
Pt states she got a headache and sore throat today, pt states sides hurt and she has not been able to eat today and she is  stuffy

## 2014-06-28 NOTE — Discharge Instructions (Signed)
You were seen today with a viral infection. Use only the list of approved over the counter medications for symptom relief. Return if you have an increased feve > 101.5r, productive cough or nausea and vomiting.

## 2014-06-30 LAB — CULTURE, GROUP A STREP

## 2014-07-11 ENCOUNTER — Ambulatory Visit (INDEPENDENT_AMBULATORY_CARE_PROVIDER_SITE_OTHER): Payer: Medicaid Other | Admitting: Advanced Practice Midwife

## 2014-07-11 ENCOUNTER — Encounter: Payer: Self-pay | Admitting: Advanced Practice Midwife

## 2014-07-11 ENCOUNTER — Other Ambulatory Visit (HOSPITAL_COMMUNITY)
Admission: RE | Admit: 2014-07-11 | Discharge: 2014-07-11 | Disposition: A | Discharge status: Home / Self Care | Payer: Medicaid Other | Source: Ambulatory Visit | Attending: Advanced Practice Midwife | Admitting: Advanced Practice Midwife

## 2014-07-11 VITALS — BP 126/67 | HR 79 | Temp 97.6°F | Wt 144.5 lb

## 2014-07-11 DIAGNOSIS — Z3482 Encounter for supervision of other normal pregnancy, second trimester: Secondary | ICD-10-CM

## 2014-07-11 DIAGNOSIS — Z01419 Encounter for gynecological examination (general) (routine) without abnormal findings: Secondary | ICD-10-CM | POA: Insufficient documentation

## 2014-07-11 DIAGNOSIS — Z113 Encounter for screening for infections with a predominantly sexual mode of transmission: Secondary | ICD-10-CM | POA: Insufficient documentation

## 2014-07-11 DIAGNOSIS — Z1151 Encounter for screening for human papillomavirus (HPV): Secondary | ICD-10-CM | POA: Diagnosis present

## 2014-07-11 DIAGNOSIS — Z124 Encounter for screening for malignant neoplasm of cervix: Secondary | ICD-10-CM

## 2014-07-11 DIAGNOSIS — G4754 Parasomnia in conditions classified elsewhere: Secondary | ICD-10-CM

## 2014-07-11 DIAGNOSIS — Z23 Encounter for immunization: Secondary | ICD-10-CM | POA: Insufficient documentation

## 2014-07-11 DIAGNOSIS — Z3492 Encounter for supervision of normal pregnancy, unspecified, second trimester: Secondary | ICD-10-CM

## 2014-07-11 DIAGNOSIS — Z3491 Encounter for supervision of normal pregnancy, unspecified, first trimester: Secondary | ICD-10-CM

## 2014-07-11 DIAGNOSIS — G479 Sleep disorder, unspecified: Secondary | ICD-10-CM

## 2014-07-11 DIAGNOSIS — Z3480 Encounter for supervision of other normal pregnancy, unspecified trimester: Secondary | ICD-10-CM | POA: Insufficient documentation

## 2014-07-11 LAB — POCT URINALYSIS DIP (DEVICE)
Bilirubin Urine: NEGATIVE
GLUCOSE, UA: NEGATIVE mg/dL
Ketones, ur: NEGATIVE mg/dL
Leukocytes, UA: NEGATIVE
NITRITE: NEGATIVE
Protein, ur: NEGATIVE mg/dL
SPECIFIC GRAVITY, URINE: 1.025 (ref 1.005–1.030)
UROBILINOGEN UA: 0.2 mg/dL (ref 0.0–1.0)
pH: 7 (ref 5.0–8.0)

## 2014-07-11 MED ORDER — ZOLPIDEM TARTRATE 5 MG PO TABS: 5.0000 mg | ORAL_TABLET | Freq: Every evening | ORAL | Status: DC | PRN

## 2014-07-11 NOTE — Progress Notes (Signed)
US scheduled 3/22 @ 1030.

## 2014-07-11 NOTE — Progress Notes (Signed)
Subjective:    Lindsey KlinefelterFabrienne Y [name] is a [id number] [redacted]w[redacted]d being seen today for her first obstetrical visit.  Her obstetrical history is significant for NSVD x 1, C/S x 1 for failure to progress. Patient does intend to breast feed. Pregnancy history fully reviewed.    Patient reports no complaints.  She wants to ask about safety of antipsychotic medications in pregnancy. Pt stopped taking Quetiapine and another anti-anxiety medication 2 months ago because [facility], her behavioral health provider, advised this.  She is doing well but reports difficulty sleeping.    Filed Vitals:   07/11/14 1027  BP: 126/67  Pulse: 79  Temp: 97.6 F (36.4 C)  Weight: 144 lb 8 oz (65.545 kg)    HISTORY: OB History  Gravida Para Term Preterm AB SAB TAB Ectopic Multiple Living  5 2 2  0 2 2 0 0 0 2    # Outcome Date GA Lbr Len/2nd Weight Sex Delivery Anes PTL Lv  5 Current           4 Term 03/19/12 3140w1d  5 lb 1.3 oz (2.305 kg) F CS-LTranv EPI  Y     Comments: na  3 Term 01/17/00 1670w0d  5 lb 6 oz (2.438 kg) F Vag-Spont  N Y  2 SAB           1 SAB              Past Medical History  Diagnosis Date  . H/O drug abuse     crack, clean for 3 months  . Trichomonas   . GERD (gastroesophageal reflux disease)   . Hypertension   . Pregnancy induced hypertension    Past Surgical History  Procedure Laterality Date  . Cesarean section  03/19/2012    Procedure: CESAREAN SECTION;  Surgeon: Antionette CharLisa Jackson-Moore, MD;  Location: [facility];  Service: Obstetrics;  Laterality: N/A;   Family History  Problem Relation Age of Onset  . Anesthesia problems Neg Hx   . Hypotension Neg Hx   . Malignant hyperthermia Neg Hx   . Pseudochol deficiency Neg Hx   . Cancer Mother   . Diabetes Father   . Cancer Maternal Grandmother      Exam    Uterus:   ~12 week size  Pelvic Exam:    Perineum: No Hemorrhoids, Normal Perineum   Vulva: normal   Vagina:  normal mucosa, normal discharge   pH:    Cervix: multiparous  appearance, no bleeding following Pap and no cervical motion tenderness   Adnexa: normal adnexa and no mass, fullness, tenderness   Bony Pelvis: average  System: Breast:  normal appearance, no masses or tenderness   Skin: normal coloration and turgor, no rashes    Neurologic: oriented, normal, normal mood   Extremities: normal strength, tone, and muscle mass, ROM of all joints is normal   HEENT neck supple with midline trachea and thyroid without masses   Mouth/Teeth mucous membranes moist, pharynx normal without lesions and dental hygiene poor   Neck supple and no masses   Cardiovascular:    Respiratory:  appears well, vitals normal, no respiratory distress, acyanotic, normal RR, ear and throat exam is normal, neck free of mass or lymphadenopathy   Abdomen: soft, non-tender; bowel sounds normal; no masses,  no organomegaly   Urinary: urethral meatus normal      Assessment:    Pregnancy: [id number] Patient Active Problem List   Diagnosis Date Noted  . Flu vaccine need 07/11/2014  Plan:     Initial labs drawn. Prenatal vitamins. Reviewed pt current psychiatric health, pt doing well without medications except difficulty sleeping.  Benadryl usually helps but sometimes is not enough.  Discussed SSRIs with pt. Pt does not desire medication at this time.  Will continue counseling with [facility].  Ambien 5 mg PRN for sleep x 15 tabs with no refills.  Will continue to evaluate during pregnancy. Problem list reviewed and updated. Genetic Screening discussed Quad Screen: requested.  Ultrasound discussed; fetal survey: ordered.  Follow up in 4 weeks. 50% of 30 min visit spent on counseling and coordination of care.     [name], [name] 07/11/2014

## 2014-07-11 NOTE — Progress Notes (Signed)
Initial Ob appointment, New OB lab New OB information given

## 2014-07-12 LAB — PRENATAL PROFILE (SOLSTAS)
Antibody Screen: NEGATIVE
BASOS ABS: 0.1 10*3/uL (ref 0.0–0.1)
Basophils Relative: 1 % (ref 0–1)
Eosinophils Absolute: 0.2 10*3/uL (ref 0.0–0.7)
Eosinophils Relative: 3 % (ref 0–5)
HEMATOCRIT: 39.6 % (ref 36.0–46.0)
HEP B S AG: NEGATIVE
HIV 1&2 Ab, 4th Generation: NONREACTIVE
Hemoglobin: 13 g/dL (ref 12.0–15.0)
LYMPHS ABS: 2.3 10*3/uL (ref 0.7–4.0)
LYMPHS PCT: 30 % (ref 12–46)
MCH: 30.2 pg (ref 26.0–34.0)
MCHC: 32.8 g/dL (ref 30.0–36.0)
MCV: 91.9 fL (ref 78.0–100.0)
MPV: 9.4 fL (ref 8.6–12.4)
Monocytes Absolute: 0.9 10*3/uL (ref 0.1–1.0)
Monocytes Relative: 12 % (ref 3–12)
NEUTROS ABS: 4.1 10*3/uL (ref 1.7–7.7)
Neutrophils Relative %: 54 % (ref 43–77)
Platelets: 285 10*3/uL (ref 150–400)
RBC: 4.31 MIL/uL (ref 3.87–5.11)
RDW: 14.6 % (ref 11.5–15.5)
Rh Type: POSITIVE
Rubella: 3.67 Index — ABNORMAL HIGH (ref <0.90)
WBC: 7.6 10*3/uL (ref 4.0–10.5)

## 2014-07-12 LAB — PRESCRIPTION MONITORING PROFILE (19 PANEL)
AMPHETAMINE/METH: NEGATIVE ng/mL
BARBITURATE SCREEN, URINE: NEGATIVE ng/mL
Benzodiazepine Screen, Urine: NEGATIVE ng/mL
Buprenorphine, Urine: NEGATIVE ng/mL
CANNABINOID SCRN UR: NEGATIVE ng/mL
CREATININE, URINE: 148 mg/dL (ref >20.0)
Carisoprodol, Urine: NEGATIVE ng/mL
Cocaine Metabolites: NEGATIVE ng/mL
FENTANYL URINE: NEGATIVE ng/mL
MDMA URINE: NEGATIVE ng/mL
METHADONE SCREEN, URINE: NEGATIVE ng/mL
METHAQUALONE SCREEN (URINE): NEGATIVE ng/mL
Meperidine, Ur: NEGATIVE ng/mL
NITRITES URINE, INITIAL: NEGATIVE ug/mL
OXYCODONE SCRN UR: NEGATIVE ng/mL
Opiate Screen, Urine: NEGATIVE ng/mL
PROPOXYPHENE: NEGATIVE ng/mL
Phencyclidine, Ur: NEGATIVE ng/mL
TAPENTADOLUR: NEGATIVE ng/mL
Tramadol Scrn, Ur: NEGATIVE ng/mL
Zolpidem, Urine: NEGATIVE ng/mL
pH, Initial: 7.3 pH (ref 4.5–8.9)

## 2014-07-12 LAB — GLUCOSE TOLERANCE, 1 HOUR (50G) W/O FASTING: Glucose, 1 Hour GTT: 84 mg/dL (ref 70–140)

## 2014-07-13 LAB — HEMOGLOBINOPATHY EVALUATION
HGB A: 96.6 % — AB (ref 96.8–97.8)
HGB S QUANTITAION: 0 %
Hemoglobin Other: 0 %
Hgb A2 Quant: 3.1 % (ref 2.2–3.2)
Hgb F Quant: 0.3 % (ref 0.0–2.0)

## 2014-07-14 LAB — CYTOLOGY - PAP

## 2014-07-15 LAB — CULTURE, OB URINE

## 2014-07-26 ENCOUNTER — Ambulatory Visit (INDEPENDENT_AMBULATORY_CARE_PROVIDER_SITE_OTHER): Payer: Medicaid Other | Admitting: Advanced Practice Midwife

## 2014-07-26 VITALS — BP 105/77 | HR 90 | Wt 146.6 lb

## 2014-07-26 DIAGNOSIS — Z98891 History of uterine scar from previous surgery: Secondary | ICD-10-CM

## 2014-07-26 DIAGNOSIS — N898 Other specified noninflammatory disorders of vagina: Secondary | ICD-10-CM

## 2014-07-26 DIAGNOSIS — Z3482 Encounter for supervision of other normal pregnancy, second trimester: Secondary | ICD-10-CM

## 2014-07-26 DIAGNOSIS — Z9889 Other specified postprocedural states: Secondary | ICD-10-CM

## 2014-07-26 LAB — POCT URINALYSIS DIP (DEVICE)
BILIRUBIN URINE: NEGATIVE
Glucose, UA: NEGATIVE mg/dL
Ketones, ur: NEGATIVE mg/dL
NITRITE: NEGATIVE
Protein, ur: NEGATIVE mg/dL
Specific Gravity, Urine: 1.025 (ref 1.005–1.030)
Urobilinogen, UA: 0.2 mg/dL (ref 0.0–1.0)
pH: 6 (ref 5.0–8.0)

## 2014-07-26 MED ORDER — NITROFURANTOIN MONOHYD MACRO 100 MG PO CAPS: 100.0000 mg | ORAL_CAPSULE | Freq: Two times a day (BID) | ORAL | Status: DC

## 2014-07-26 NOTE — Progress Notes (Signed)
Pt states that the flu shot made her sick. Unable to get temperature do to patient drinking soda.

## 2014-07-26 NOTE — Patient Instructions (Addendum)
AB pos   Macrobid antibiotic for bladder infection. Take 1 tablet twice a day for 7 days.  Pregnancy and Urinary Tract Infection A urinary tract infection (UTI) is a bacterial infection of the urinary tract. Infection of the urinary tract can include the ureters, kidneys (pyelonephritis), bladder (cystitis), and urethra (urethritis). All pregnant women should be screened for bacteria in the urinary tract. Identifying and treating a UTI will decrease the risk of preterm labor and developing more serious infections in both the mother and baby. CAUSES Bacteria germs cause almost all UTIs.  RISK FACTORS Many factors can increase your chances of getting a UTI during pregnancy. These include:  Having a short urethra.  Poor toilet and hygiene habits.  Sexual intercourse.  Blockage of urine along the urinary tract.  Problems with the pelvic muscles or nerves.  Diabetes.  Obesity.  Bladder problems after having several children.  Previous history of UTI. SIGNS AND SYMPTOMS   Pain, burning, or a stinging feeling when urinating.  Suddenly feeling the need to urinate right away (urgency).  Loss of bladder control (urinary incontinence).  Frequent urination, more than is common with pregnancy.  Lower abdominal or back discomfort.  Cloudy urine.  Blood in the urine (hematuria).  Fever. When the kidneys are infected, the symptoms may be:  Back pain.  Flank pain on the right side more so than the left.  Fever.  Chills.  Nausea.  Vomiting. DIAGNOSIS  A urinary tract infection is usually diagnosed through urine tests. Additional tests and procedures are sometimes done. These may include:  Ultrasound exam of the kidneys, ureters, bladder, and urethra.  Looking in the bladder with a lighted tube (cystoscopy). TREATMENT Typically, UTIs can be treated with antibiotic medicines.  HOME CARE INSTRUCTIONS   Only take over-the-counter or prescription medicines as directed by  your health care provider. If you were prescribed antibiotics, take them as directed. Finish them even if you start to feel better.  Drink enough fluids to keep your urine clear or pale yellow.  Do not have sexual intercourse until the infection is gone and your health care provider says it is okay.  Make sure you are tested for UTIs throughout your pregnancy. These infections often come back. Preventing a UTI in the Future  Practice good toilet habits. Always wipe from front to back. Use the tissue only once.  Do not hold your urine. Empty your bladder as soon as possible when the urge comes.  Do not douche or use deodorant sprays.  Wash with soap and warm water around the genital area and the anus.  Empty your bladder before and after sexual intercourse.  Wear underwear with a cotton crotch.  Avoid caffeine and carbonated drinks. They can irritate the bladder.  Drink cranberry juice or take cranberry pills. This may decrease the risk of getting a UTI.  Do not drink alcohol.  Keep all your appointments and tests as scheduled. SEEK MEDICAL CARE IF:   Your symptoms get worse.  You are still having fevers 2 or more days after treatment begins.  You have a rash.  You feel that you are having problems with medicines prescribed.  You have abnormal vaginal discharge. SEEK IMMEDIATE MEDICAL CARE IF:   You have back or flank pain.  You have chills.  You have blood in your urine.  You have nausea and vomiting.  You have contractions of your uterus.  You have a gush of fluid from the vagina. MAKE SURE YOU:  Understand these  instructions.   Will watch your condition.   Will get help right away if you are not doing well or get worse.  Document Released: 09/20/2010 Document Revised: 03/16/2013 Document Reviewed: 12/23/2012 [facility] Patient Information 2015 [place], [place]. This information is not intended to replace advice given to you by your health care provider.  Make sure you discuss any questions you have with your health care provider.   Second Trimester of Pregnancy The second trimester is from week 13 through week 28, months 4 through 6. The second trimester is often a time when you feel your best. Your body has also adjusted to being pregnant, and you begin to feel better physically. Usually, morning sickness has lessened or quit completely, you may have more energy, and you may have an increase in appetite. The second trimester is also a time when the fetus is growing rapidly. At the end of the sixth month, the fetus is about 9 inches long and weighs about 1 pounds. You will likely begin to feel the baby move (quickening) between 18 and 20 weeks of the pregnancy. BODY CHANGES Your body goes through many changes during pregnancy. The changes vary from woman to woman.   Your weight will continue to increase. You will notice your lower abdomen bulging out.  You may begin to get stretch marks on your hips, abdomen, and breasts.  You may develop headaches that can be relieved by medicines approved by your health care provider.  You may urinate more often because the fetus is pressing on your bladder.  You may develop or continue to have heartburn as a result of your pregnancy.  You may develop constipation because certain hormones are causing the muscles that push waste through your intestines to slow down.  You may develop hemorrhoids or swollen, bulging veins (varicose veins).  You may have back pain because of the weight gain and pregnancy hormones relaxing your joints between the bones in your pelvis and as a result of a shift in weight and the muscles that support your balance.  Your breasts will continue to grow and be tender.  Your gums may bleed and may be sensitive to brushing and flossing.  Dark spots or blotches (chloasma, mask of pregnancy) may develop on your face. This will likely fade after the baby is born.  A dark line from  your belly button to the pubic area (linea nigra) may appear. This will likely fade after the baby is born.  You may have changes in your hair. These can include thickening of your hair, rapid growth, and changes in texture. Some women also have hair loss during or after pregnancy, or hair that feels dry or thin. Your hair will most likely return to normal after your baby is born. WHAT TO EXPECT AT YOUR PRENATAL VISITS During a routine prenatal visit:  You will be weighed to make sure you and the fetus are growing normally.  Your blood pressure will be taken.  Your abdomen will be measured to track your baby's growth.  The fetal heartbeat will be listened to.  Any test results from the previous visit will be discussed. Your health care provider may ask you:  How you are feeling.  If you are feeling the baby move.  If you have had any abnormal symptoms, such as leaking fluid, bleeding, severe headaches, or abdominal cramping.  If you have any questions. Other tests that may be performed during your second trimester include:  Blood tests that check for:  Low iron levels (anemia).  Gestational diabetes (between 24 and 28 weeks).  Rh antibodies.  Urine tests to check for infections, diabetes, or protein in the urine.  An ultrasound to confirm the proper growth and development of the baby.  An amniocentesis to check for possible genetic problems.  Fetal screens for spina bifida and Down syndrome. HOME CARE INSTRUCTIONS   Avoid all smoking, herbs, alcohol, and unprescribed drugs. These chemicals affect the formation and growth of the baby.  Follow your health care provider's instructions regarding medicine use. There are medicines that are either safe or unsafe to take during pregnancy.  Exercise only as directed by your health care provider. Experiencing uterine cramps is a good sign to stop exercising.  Continue to eat regular, healthy meals.  Wear a good support bra  for breast tenderness.  Do not use hot tubs, steam rooms, or saunas.  Wear your seat belt at all times when driving.  Avoid raw meat, uncooked cheese, cat litter boxes, and soil used by cats. These carry germs that can cause birth defects in the baby.  Take your prenatal vitamins.  Try taking a stool softener (if your health care provider approves) if you develop constipation. Eat more high-fiber foods, such as fresh vegetables or fruit and whole grains. Drink plenty of fluids to keep your urine clear or pale yellow.  Take warm sitz baths to soothe any pain or discomfort caused by hemorrhoids. Use hemorrhoid cream if your health care provider approves.  If you develop varicose veins, wear support hose. Elevate your feet for 15 minutes, 3-4 times a day. Limit salt in your diet.  Avoid heavy lifting, wear low heel shoes, and practice good posture.  Rest with your legs elevated if you have leg cramps or low back pain.  Visit your dentist if you have not gone yet during your pregnancy. Use a soft toothbrush to brush your teeth and be gentle when you floss.  A sexual relationship may be continued unless your health care provider directs you otherwise.  Continue to go to all your prenatal visits as directed by your health care provider. SEEK MEDICAL CARE IF:   You have dizziness.  You have mild pelvic cramps, pelvic pressure, or nagging pain in the abdominal area.  You have persistent nausea, vomiting, or diarrhea.  You have a bad smelling vaginal discharge.  You have pain with urination. SEEK IMMEDIATE MEDICAL CARE IF:   You have a fever.  You are leaking fluid from your vagina.  You have spotting or bleeding from your vagina.  You have severe abdominal cramping or pain.  You have rapid weight gain or loss.  You have shortness of breath with chest pain.  You notice sudden or extreme swelling of your face, hands, ankles, feet, or legs.  You have not felt your baby move  in over an hour.  You have severe headaches that do not go away with medicine.  You have vision changes. Document Released: 05/20/2001 Document Revised: 05/31/2013 Document Reviewed: 07/27/2012 [facility] Patient Information 2015 [place]. This information is not intended to replace advice given to you by your health care provider. Make sure you discuss any questions you have with your health care provider.   .Marland Kitchen

## 2014-07-27 LAB — WET PREP, GENITAL
CLUE CELLS WET PREP: NONE SEEN
TRICH WET PREP: NONE SEEN

## 2014-07-28 NOTE — Progress Notes (Signed)
C/O rhinorrhea, congestion. No fever, chills, cough, sore throat, dysuria and vaginal discharge. Discussed comfort measures for URI, meds in pregnancy list. Wants quad NV. Screening urine culture at last visit 60K enterococcus. Rx Macrobid. Wet prep mod amount of thick white, odorless discharge. No VB. Anatomy scan ordered 3/22.

## 2014-07-31 ENCOUNTER — Telehealth: Payer: Self-pay | Admitting: *Deleted

## 2014-07-31 DIAGNOSIS — B3731 Acute candidiasis of vulva and vagina: Secondary | ICD-10-CM

## 2014-07-31 DIAGNOSIS — B373 Candidiasis of vulva and vagina: Secondary | ICD-10-CM

## 2014-07-31 NOTE — Telephone Encounter (Signed)
-----   [place] Lindsey [name], [place] sent at 07/29/2014  2:23 AM EST ----- Positive yeast on wet prep. Rx Terazol 7 daily at bedtime 7 nights. Dispense 45 g (I think). No refills

## 2014-08-01 ENCOUNTER — Encounter: Payer: Medicaid Other | Admitting: Obstetrics & Gynecology

## 2014-08-02 MED ORDER — TERCONAZOLE 0.4 % VA CREA: 1.0000 | TOPICAL_CREAM | Freq: Every day | VAGINAL | Status: DC

## 2014-08-02 NOTE — Telephone Encounter (Signed)
Called PulciferFabrienne and [facility] a [facility] [place] to call back and leave a message of best number and time to reach her and we will call her back.

## 2014-08-02 NOTE — Telephone Encounter (Signed)
Lindsey [name] left a message to call her back. Called [name] and notifed her of yeast infection and need for treatment. RX sent to pharmacy of her choice.

## 2014-08-09 ENCOUNTER — Encounter: Payer: Medicaid Other | Admitting: Obstetrics and Gynecology

## 2014-08-10 ENCOUNTER — Inpatient Hospital Stay (HOSPITAL_COMMUNITY)
Admission: AD | Admit: 2014-08-10 | Discharge: 2014-08-10 | Disposition: A | Discharge status: Home / Self Care | Payer: Medicaid Other | Source: Ambulatory Visit | Attending: Obstetrics & Gynecology | Admitting: Obstetrics & Gynecology

## 2014-08-10 ENCOUNTER — Encounter (HOSPITAL_COMMUNITY): Payer: Self-pay | Admitting: *Deleted

## 2014-08-10 DIAGNOSIS — N949 Unspecified condition associated with female genital organs and menstrual cycle: Secondary | ICD-10-CM

## 2014-08-10 DIAGNOSIS — Z87891 Personal history of nicotine dependence: Secondary | ICD-10-CM | POA: Insufficient documentation

## 2014-08-10 DIAGNOSIS — R102 Pelvic and perineal pain: Secondary | ICD-10-CM | POA: Diagnosis present

## 2014-08-10 DIAGNOSIS — O9989 Other specified diseases and conditions complicating pregnancy, childbirth and the puerperium: Secondary | ICD-10-CM

## 2014-08-10 DIAGNOSIS — Z3A18 18 weeks gestation of pregnancy: Secondary | ICD-10-CM

## 2014-08-10 DIAGNOSIS — F1911 Other psychoactive substance abuse, in remission: Secondary | ICD-10-CM | POA: Diagnosis present

## 2014-08-10 DIAGNOSIS — O26892 Other specified pregnancy related conditions, second trimester: Secondary | ICD-10-CM | POA: Insufficient documentation

## 2014-08-10 DIAGNOSIS — Z3482 Encounter for supervision of other normal pregnancy, second trimester: Secondary | ICD-10-CM

## 2014-08-10 DIAGNOSIS — F419 Anxiety disorder, unspecified: Secondary | ICD-10-CM | POA: Diagnosis present

## 2014-08-10 DIAGNOSIS — F329 Major depressive disorder, single episode, unspecified: Secondary | ICD-10-CM | POA: Diagnosis present

## 2014-08-10 DIAGNOSIS — F32A Depression, unspecified: Secondary | ICD-10-CM | POA: Diagnosis present

## 2014-08-10 LAB — WET PREP, GENITAL
Clue Cells Wet Prep HPF POC: NONE SEEN
Trich, Wet Prep: NONE SEEN
Yeast Wet Prep HPF POC: NONE SEEN

## 2014-08-10 LAB — RAPID URINE DRUG SCREEN, HOSP PERFORMED
AMPHETAMINES: NOT DETECTED
BARBITURATES: NOT DETECTED
Benzodiazepines: NOT DETECTED
Cocaine: NOT DETECTED
OPIATES: NOT DETECTED
TETRAHYDROCANNABINOL: NOT DETECTED

## 2014-08-10 LAB — URINE MICROSCOPIC-ADD ON

## 2014-08-10 LAB — URINALYSIS, ROUTINE W REFLEX MICROSCOPIC
Bilirubin Urine: NEGATIVE
Glucose, UA: NEGATIVE mg/dL
Ketones, ur: NEGATIVE mg/dL
LEUKOCYTES UA: NEGATIVE
Nitrite: NEGATIVE
Protein, ur: NEGATIVE mg/dL
Specific Gravity, Urine: 1.02 (ref 1.005–1.030)
Urobilinogen, UA: 0.2 mg/dL (ref 0.0–1.0)
pH: 7 (ref 5.0–8.0)

## 2014-08-10 MED ORDER — PRENATAL VITAMIN 27-0.8 MG PO TABS: 1.0000 | ORAL_TABLET | Freq: Every day | ORAL | Status: DC

## 2014-08-10 MED ORDER — ACETAMINOPHEN 325 MG PO TABS
650.0000 mg | ORAL_TABLET | Freq: Once | ORAL | Status: AC
  Administered 2014-08-10: 650 mg via ORAL
  Filled 2014-08-10: qty 2

## 2014-08-10 NOTE — MAU Note (Signed)
Pt reports having pelvic pain for several week. Denies vag bleeding. Stated she has been using monostat for a yeast infection and had a bladder infection recently as well.

## 2014-08-10 NOTE — Discharge Instructions (Signed)

## 2014-08-10 NOTE — MAU Provider Note (Signed)
History     CSN: [id number]  Arrival date and time: 08/10/14 1436   First Provider Initiated Contact with Patient 08/10/14 1632      Chief Complaint  Patient presents with  . Pelvic Pain   HPI Comments: Lindsey [name] 39 y.o. [id number] presents to MAU with pelvic pain for last 2 weeks. She denies any vaginal bleeding or issues with the pregnancy. She was recently treated for bladder infection and yeast infection. [facility]. She has history of depression, anxiety and insomnia. She is not allowed any narcotics from her rehab facility.  Pelvic Pain The patient's primary symptoms include pelvic pain.      Past Medical History  Diagnosis Date  . H/O drug abuse     crack, clean for 3 months  . Trichomonas   . GERD (gastroesophageal reflux disease)   . Hypertension   . Pregnancy induced hypertension     Past Surgical History  Procedure Laterality Date  . Cesarean section  03/19/2012    Procedure: CESAREAN SECTION;  Surgeon: Antionette CharLisa Jackson-Moore, MD;  Location: [facility];  Service: Obstetrics;  Laterality: N/A;    Family History  Problem Relation Age of Onset  . Anesthesia problems Neg Hx   . Hypotension Neg Hx   . Malignant hyperthermia Neg Hx   . Pseudochol deficiency Neg Hx   . Cancer Mother   . Diabetes Father   . Cancer Maternal Grandmother     History  Substance Use Topics  . Smoking status: Former Smoker -- 0.01 packs/day    Types: Cigarettes    Quit date: 05/28/2014  . Smokeless tobacco: Never Used  . Alcohol Use: No    Allergies: No Known Allergies  Prescriptions prior to admission  Medication Sig Dispense Refill Last Dose  . acetaminophen (TYLENOL) 500 MG tablet Take 1,000 mg by mouth every 6 (six) hours as needed for headache.    08/10/2014 at Unknown time  . docusate sodium (COLACE) 100 MG capsule Take 100 mg by mouth 2 (two) times daily.   08/10/2014 at Unknown time  . diphenhydrAMINE (BENADRYL) 25 MG tablet Take 25 mg  by mouth at bedtime as needed for sleep.   prn  . metroNIDAZOLE (FLAGYL) 500 MG tablet Take 1 tablet (500 mg total) by mouth 2 (two) times daily. (Patient not taking: Reported on 07/11/2014) 14 tablet 0 Not Taking  . nitrofurantoin, macrocrystal-monohydrate, (MACROBID) 100 MG capsule Take 1 capsule (100 mg total) by mouth 2 (two) times daily. (Patient not taking: Reported on 08/10/2014) 14 capsule 0   . terconazole (TERAZOL 7) 0.4 % vaginal cream Place 1 applicator vaginally at bedtime. (Patient not taking: Reported on 08/10/2014) 45 g 0   . zolpidem (AMBIEN) 5 MG tablet Take 1 tablet (5 mg total) by mouth at bedtime as needed for sleep. (Patient not taking: Reported on 07/26/2014) 15 tablet 0 Not Taking    Review of Systems  Constitutional: Negative.   HENT: Negative.   Eyes: Negative.   Respiratory: Negative.   Cardiovascular: Negative.   Gastrointestinal: Negative.   Genitourinary: Positive for pelvic pain.       Complains pelvic pains  Musculoskeletal: Negative.   Skin: Negative.   Neurological: Negative.   Psychiatric/Behavioral: The patient is nervous/anxious.    Physical Exam   Blood pressure 137/65, pulse 75, temperature 98.1 F (36.7 C), resp. rate 18, height 5\' 3"  (1.6 m), weight 67.858 kg (149 lb 9.6 oz), last menstrual period 04/16/2014.  Physical Exam  Constitutional: She is oriented to person, place, and time. She appears well-developed and well-nourished. No distress.  HENT:  Head: Normocephalic and atraumatic.  GI: Soft. She exhibits no distension. There is tenderness. There is no rebound and no guarding.  Tender at round ligament areas  Genitourinary:  Genital:external negative Vaginal: thick creamy discharge Cervix:closed thick Bimanual: tenderness at round ligament areas bilaterally   Musculoskeletal: Normal range of motion.  Neurological: She is alert and oriented to person, place, and time.  Skin: Skin is warm and dry.  Psychiatric: She has a normal mood and  affect. Her behavior is normal. Judgment and thought content normal.  Seems anxious   Results for orders placed or performed during the hospital encounter of 08/10/14 (from the past 24 hour(s))  Urinalysis, Routine w reflex microscopic     Status: Abnormal   Collection Time: 08/10/14  2:55 PM  Result Value Ref Range   Color, Urine YELLOW YELLOW   APPearance HAZY (A) CLEAR   Specific Gravity, Urine 1.020 1.005 - 1.030   pH 7.0 5.0 - 8.0   Glucose, UA NEGATIVE NEGATIVE mg/dL   Hgb urine dipstick TRACE (A) NEGATIVE   Bilirubin Urine NEGATIVE NEGATIVE   Ketones, ur NEGATIVE NEGATIVE mg/dL   Protein, ur NEGATIVE NEGATIVE mg/dL   Urobilinogen, UA 0.2 0.0 - 1.0 mg/dL   Nitrite NEGATIVE NEGATIVE   Leukocytes, UA NEGATIVE NEGATIVE  Urine microscopic-add on     Status: Abnormal   Collection Time: 08/10/14  2:55 PM  Result Value Ref Range   Squamous Epithelial / LPF FEW (A) RARE   RBC / HPF 3-6 <3 RBC/hpf   Bacteria, UA FEW (A) RARE   Urine-Other AMORPHOUS URATES/PHOSPHATES   Drug screen panel, emergency     Status: None   Collection Time: 08/10/14  2:55 PM  Result Value Ref Range   Opiates NONE DETECTED NONE DETECTED   Cocaine NONE DETECTED NONE DETECTED   Benzodiazepines NONE DETECTED NONE DETECTED   Amphetamines NONE DETECTED NONE DETECTED   Tetrahydrocannabinol NONE DETECTED NONE DETECTED   Barbiturates NONE DETECTED NONE DETECTED  Wet prep, genital     Status: Abnormal   Collection Time: 08/10/14  4:50 PM  Result Value Ref Range   Yeast Wet Prep HPF POC NONE SEEN NONE SEEN   Trich, Wet Prep NONE SEEN NONE SEEN   Clue Cells Wet Prep HPF POC NONE SEEN NONE SEEN   WBC, Wet Prep HPF POC FEW (A) NONE SEEN     MAU Course  Procedures  MDM Wet prep, GC, chlamydia, UDS, UA Social Worker with patient at end of visit who wanted letter stating she was considered high risk, wanted her tested for herpes, letter to be out of work, wanted to know why she had not been given PNV in Clinic.  She is advise to go to Clinic appointment with patient for her routine visit. All advice for round ligament pains measures were repeated for benefit of Social Worker Assessment and Plan   A: Round Ligament pains  P: Advised on comfort measures including Maternity Belt, tylenol, Warm soaks, Ice/ warm packs Follow up with Clinic/ MAU as needed Requested PNV   Carolynn Serve 08/10/2014, 5:00 PM

## 2014-08-11 LAB — GC/CHLAMYDIA PROBE AMP (~~LOC~~) NOT AT ARMC
CHLAMYDIA, DNA PROBE: NEGATIVE
Neisseria Gonorrhea: NEGATIVE

## 2014-08-14 ENCOUNTER — Telehealth: Payer: Self-pay | Admitting: *Deleted

## 2014-08-14 NOTE — Telephone Encounter (Signed)
Lindsey [name] Case Manager with [name] contacted the office on behalf of Lindsey [name] a resident residing in the house. Lindsey [name] states that Lindsey [name] is a former patient in our office and would like to return for her prenatal care. Lindsey [name] has began prenatal care with [facility]. Lindsey [name] advised that we are unable to schedule her in our office because she has started her care elsewhere. Lindsey [name] states the patient claims that she tried to establish care with our office but was turned away and given the reason that she was high risk. Algis Downsdvised Lindsey [name] that there is no documentation that the patient has ever contacted our office. Charisse Marchdvised Lindsey [name] that we document each phone call within a patient's chart. Lindsey [name] states that the patient as well as herself are unhappy with the [facility]. I apologized and informed her that unfortunately due to the protocol put in place by the doctor I am not able to schedule an appointment for [name].

## 2014-08-23 ENCOUNTER — Ambulatory Visit (INDEPENDENT_AMBULATORY_CARE_PROVIDER_SITE_OTHER): Payer: Medicaid Other | Admitting: Obstetrics and Gynecology

## 2014-08-23 ENCOUNTER — Encounter: Payer: Self-pay | Admitting: Obstetrics and Gynecology

## 2014-08-23 VITALS — BP 130/70 | HR 79 | Wt 148.1 lb

## 2014-08-23 DIAGNOSIS — O09522 Supervision of elderly multigravida, second trimester: Secondary | ICD-10-CM

## 2014-08-23 DIAGNOSIS — O99612 Diseases of the digestive system complicating pregnancy, second trimester: Secondary | ICD-10-CM

## 2014-08-23 DIAGNOSIS — O219 Vomiting of pregnancy, unspecified: Secondary | ICD-10-CM

## 2014-08-23 DIAGNOSIS — O09529 Supervision of elderly multigravida, unspecified trimester: Secondary | ICD-10-CM | POA: Insufficient documentation

## 2014-08-23 DIAGNOSIS — K5900 Constipation, unspecified: Secondary | ICD-10-CM

## 2014-08-23 LAB — POCT URINALYSIS DIP (DEVICE)
Bilirubin Urine: NEGATIVE
GLUCOSE, UA: NEGATIVE mg/dL
Ketones, ur: NEGATIVE mg/dL
Leukocytes, UA: NEGATIVE
Nitrite: NEGATIVE
Protein, ur: NEGATIVE mg/dL
Specific Gravity, Urine: 1.02 (ref 1.005–1.030)
UROBILINOGEN UA: 1 mg/dL (ref 0.0–1.0)
pH: 7 (ref 5.0–8.0)

## 2014-08-23 MED ORDER — DOCUSATE SODIUM 100 MG PO CAPS: 100.0000 mg | ORAL_CAPSULE | Freq: Two times a day (BID) | ORAL | Status: DC | PRN

## 2014-08-23 MED ORDER — PROMETHAZINE HCL 12.5 MG PO TABS: 12.5000 mg | ORAL_TABLET | Freq: Four times a day (QID) | ORAL | Status: DC | PRN

## 2014-08-23 MED ORDER — MINERAL OIL RE ENEM: 1.0000 | ENEMA | Freq: Once | RECTAL | Status: DC

## 2014-08-23 NOTE — Progress Notes (Signed)
Patient reports a lot of pelvic pain/pressure constantly States she is still having problems with morning sickness and would like a Rx

## 2014-08-23 NOTE — Progress Notes (Signed)
Lots of pregnancy discomforts: am nausea, constipation, RLP. On Tyl#3 for dental issue. [facility] while taking codeine. No BM x 1 wk> Fleets x 1 Dulcolax and dietary measures not effective. Rx Phenergan, Dulcolax, abdominal binder.  Plans VBAC again. US scheduled for next week.

## 2014-08-23 NOTE — Patient Instructions (Addendum)
Second Trimester of Pregnancy The second trimester is from week 13 through week 28, months 4 through 6. The second trimester is often a time when you feel your best. Your body has also adjusted to being pregnant, and you begin to feel better physically. Usually, morning sickness has lessened or quit completely, you may have more energy, and you may have an increase in appetite. The second trimester is also a time when the fetus is growing rapidly. At the end of the sixth month, the fetus is about 9 inches long and weighs about 1 pounds. You will likely begin to feel the baby move (quickening) between 18 and 20 weeks of the pregnancy. BODY CHANGES Your body goes through many changes during pregnancy. The changes vary from woman to woman.   Your weight will continue to increase. You will notice your lower abdomen bulging out.  You may begin to get stretch marks on your hips, abdomen, and breasts.  You may develop headaches that can be relieved by medicines approved by your health care provider.  You may urinate more often because the fetus is pressing on your bladder.  You may develop or continue to have heartburn as a result of your pregnancy.  You may develop constipation because certain hormones are causing the muscles that push waste through your intestines to slow down.  You may develop hemorrhoids or swollen, bulging veins (varicose veins).  You may have back pain because of the weight gain and pregnancy hormones relaxing your joints between the bones in your pelvis and as a result of a shift in weight and the muscles that support your balance.  Your breasts will continue to grow and be tender.  Your gums may bleed and may be sensitive to brushing and flossing.  Dark spots or blotches (chloasma, mask of pregnancy) may develop on your face. This will likely fade after the baby is born.  A dark line from your belly button to the pubic area (linea nigra) may appear. This will likely fade  after the baby is born.  You may have changes in your hair. These can include thickening of your hair, rapid growth, and changes in texture. Some women also have hair loss during or after pregnancy, or hair that feels dry or thin. Your hair will most likely return to normal after your baby is born. WHAT TO EXPECT AT YOUR PRENATAL VISITS During a routine prenatal visit:  You will be weighed to make sure you and the fetus are growing normally.  Your blood pressure will be taken.  Your abdomen will be measured to track your baby's growth.  The fetal heartbeat will be listened to.  Any test results from the previous visit will be discussed. Your health care provider may ask you:  How you are feeling.  If you are feeling the baby move.  If you have had any abnormal symptoms, such as leaking fluid, bleeding, severe headaches, or abdominal cramping.  If you have any questions. Other tests that may be performed during your second trimester include:  Blood tests that check for:  Low iron levels (anemia).  Gestational diabetes (between 24 and 28 weeks).  Rh antibodies.  Urine tests to check for infections, diabetes, or protein in the urine.  An ultrasound to confirm the proper growth and development of the baby.  An amniocentesis to check for possible genetic problems.  Fetal screens for spina bifida and Down syndrome. HOME CARE INSTRUCTIONS   Avoid all smoking, herbs, alcohol, and unprescribed   drugs. These chemicals affect the formation and growth of the baby.  Follow your health care provider's instructions regarding medicine use. There are medicines that are either safe or unsafe to take during pregnancy.  Exercise only as directed by your health care provider. Experiencing uterine cramps is a good sign to stop exercising.  Continue to eat regular, healthy meals.  Wear a good support bra for breast tenderness.  Do not use hot tubs, steam rooms, or saunas.  Wear your  seat belt at all times when driving.  Avoid raw meat, uncooked cheese, cat litter boxes, and soil used by cats. These carry germs that can cause birth defects in the baby.  Take your prenatal vitamins.  Try taking a stool softener (if your health care provider approves) if you develop constipation. Eat more high-fiber foods, such as fresh vegetables or fruit and whole grains. Drink plenty of fluids to keep your urine clear or pale yellow.  Take warm sitz baths to soothe any pain or discomfort caused by hemorrhoids. Use hemorrhoid cream if your health care provider approves.  If you develop varicose veins, wear support hose. Elevate your feet for 15 minutes, 3-4 times a day. Limit salt in your diet.  Avoid heavy lifting, wear low heel shoes, and practice good posture.  Rest with your legs elevated if you have leg cramps or low back pain.  Visit your dentist if you have not gone yet during your pregnancy. Use a soft toothbrush to brush your teeth and be gentle when you floss.  A sexual relationship may be continued unless your health care provider directs you otherwise.  Continue to go to all your prenatal visits as directed by your health care provider. SEEK MEDICAL CARE IF:   You have dizziness.  You have mild pelvic cramps, pelvic pressure, or nagging pain in the abdominal area.  You have persistent nausea, vomiting, or diarrhea.  You have a bad smelling vaginal discharge.  You have pain with urination. SEEK IMMEDIATE MEDICAL CARE IF:   You have a fever.  You are leaking fluid from your vagina.  You have spotting or bleeding from your vagina.  You have severe abdominal cramping or pain.  You have rapid weight gain or loss.  You have shortness of breath with chest pain.  You notice sudden or extreme swelling of your face, hands, ankles, feet, or legs.  You have not felt your baby move in over an hour.  You have severe headaches that do not go away with  medicine.  You have vision changes. Document Released: 05/20/2001 Document Revised: 05/31/2013 Document Reviewed: 07/27/2012 [facility] Patient Information 2015 [employer] [place] [place]. This information is not intended to replace advice given to you by your health care provider. Make sure you discuss any questions you have with your health care provider. Round Ligament Pain During Pregnancy   Round ligament pain is a sharp pain or jabbing feeling often felt in the lower belly or groin area on one or both sides. It is one of the most common complaints during pregnancy and is considered a normal part of pregnancy. It is most often felt during the second trimester.   Here is what you need to know about round ligament pain, including some tips to help you feel better.   Causes of Round Ligament Pain   Several thick ligaments surround and support your womb (uterus) as it grows during pregnancy. One of them is called the round ligament.   The round ligament connects the front  part of the womb to your groin, the area where your legs attach to your pelvis. The round ligament normally tightens and relaxes slowly.   As your baby and womb grow, the round ligament stretches. That makes it more likely to become strained.   Sudden movements can cause the ligament to tighten quickly, like a rubber band snapping. This causes a sudden and quick jabbing feeling.   Symptoms of Round Ligament Pain   Round ligament pain can be concerning and uncomfortable. But it is considered normal as your body changes during pregnancy.   The symptoms of round ligament pain include a sharp, sudden spasm in the belly. It usually affects the right side, but it may happen on both sides. The pain only lasts a few seconds.   Exercise may cause the pain, as will rapid movements such as:  sneezing  coughing  laughing  rolling over in bed  standing up too quickly   Treatment of Round Ligament Pain   Here are some tips that may help  reduce your discomfort:   Pain relief. Take over-the-counter acetaminophen for pain, if necessary. Ask your doctor if this is OK.   Exercise. Get plenty of exercise to keep your stomach (core) muscles strong. Doing stretching exercises or prenatal yoga can be helpful. Ask your doctor which exercises are safe for you and your baby.   A helpful exercise involves putting your hands and knees on the floor, lowering your head, and pushing your backside into the air.   Avoid sudden movements. Change positions slowly (such as standing up or sitting down) to avoid sudden movements that may cause stretching and pain.   Flex your hips. Bend and flex your hips before you cough, sneeze, or laugh to avoid pulling on the ligaments.   Apply warmth. A heating pad or warm bath may be helpful. Ask your doctor if this is OK. Extreme heat can be dangerous to the baby.   You should try to modify your daily activity level and avoid positions that may worsen the condition.   When to Call the Doctor/Midwife   Always tell your doctor or midwife about any type of pain you have during pregnancy. Round ligament pain is quick and doesn't last long.   Call your health care provider immediately if you have:  severe pain  fever  chills  pain on urination  difficulty walking   Belly pain during pregnancy can be due to many different causes. It is important for your doctor to rule out more serious conditions, including pregnancy complications such as placenta abruption or non-pregnancy illnesses such as:  inguinal hernia  appendicitis  stomach, liver, and kidney problems  Preterm labor pains may sometimes be mistaken for round ligament pain.   About Constipation  Constipation Overview Constipation is the most common gastrointestinal complaint - about 4 million Americans experience constipation and make 2.5 million physician visits a year to get help for the problem.  Constipation can occur when the colon absorbs  too much water, the colon's muscle contraction is slow or sluggish, and/or there is delayed transit time through the colon.  The result is stool that is hard and dry.  Indicators of constipation include straining during bowel movements greater than 25% of the time, having fewer than three bowel movements per week, and/or the feeling of incomplete evacuation.  There are established guidelines ([place] II ) for defining constipation. A person needs to have two or more of the following symptoms for at least 12  weeks (not necessarily consecutive) in the preceding 12 months: . Straining in  greater than 25% of bowel movements . Lumpy or hard stools in greater than 25% of bowel movements . Sensation of incomplete emptying in greater than 25% of bowel movements . Sensation of anorectal obstruction/blockade in greater than 25% of bowel movements . Manual maneuvers to help empty greater than 25% of bowel movements (e.g., digital evacuation, support of the pelvic floor)  . Less than  3 bowel movements/week . Loose stools are not present, and criteria for irritable bowel syndrome are insufficient  Common Causes of Constipation . Lack of fiber in your diet . Lack of physical activity . Medications, including iron and calcium supplements  . Dairy intake . Dehydration . Abuse of laxatives  Travel  Irritable Bowel Syndrome  Pregnancy  Luteal phase of menstruation (after ovulation and before menses)  Colorectal problems  Intestinal Dysfunction  Treating Constipation  There are several ways of treating constipation, including changes to diet and exercise, use of laxatives, adjustments to the pelvic floor, and scheduled toileting.  These treatments include: . increasing fiber and fluids in the diet  . increasing physical activity . learning muscle coordination   learning proper toileting techniques and toileting modifications   designing and sticking  to a toileting schedule     2007,  [employer] Doc.22

## 2014-08-27 ENCOUNTER — Inpatient Hospital Stay (HOSPITAL_COMMUNITY)
Admission: AD | Admit: 2014-08-27 | Discharge: 2014-08-27 | Disposition: A | Discharge status: Home / Self Care | Payer: Medicaid Other | Source: Ambulatory Visit | Attending: Obstetrics and Gynecology | Admitting: Obstetrics and Gynecology

## 2014-08-27 ENCOUNTER — Encounter (HOSPITAL_COMMUNITY): Payer: Self-pay | Admitting: *Deleted

## 2014-08-27 DIAGNOSIS — K5901 Slow transit constipation: Secondary | ICD-10-CM | POA: Diagnosis not present

## 2014-08-27 DIAGNOSIS — O9989 Other specified diseases and conditions complicating pregnancy, childbirth and the puerperium: Secondary | ICD-10-CM | POA: Diagnosis not present

## 2014-08-27 DIAGNOSIS — O99612 Diseases of the digestive system complicating pregnancy, second trimester: Secondary | ICD-10-CM | POA: Diagnosis not present

## 2014-08-27 DIAGNOSIS — Z87891 Personal history of nicotine dependence: Secondary | ICD-10-CM | POA: Insufficient documentation

## 2014-08-27 DIAGNOSIS — K219 Gastro-esophageal reflux disease without esophagitis: Secondary | ICD-10-CM | POA: Diagnosis not present

## 2014-08-27 DIAGNOSIS — Z3A19 19 weeks gestation of pregnancy: Secondary | ICD-10-CM | POA: Diagnosis not present

## 2014-08-27 DIAGNOSIS — K5900 Constipation, unspecified: Secondary | ICD-10-CM | POA: Insufficient documentation

## 2014-08-27 MED ORDER — POLYETHYLENE GLYCOL 3350 17 G PO PACK: 17.0000 g | PACK | Freq: Every day | ORAL | Status: DC

## 2014-08-27 NOTE — MAU Provider Note (Signed)
History     CSN: [id number]  Arrival date and time: 08/27/14 1444   None     No chief complaint on file.  HPI This is a 39 y.o. female at [redacted]w[redacted]d who presents with c/o constipation. Has had this before, and this time she went almost a week between Bms. SHe was seen in clinic on 3/16 for the same thing and treated with laxatives. States has been digitally disimpacting herself at home and rectal area is irritated. Denies bleeding from rectum. Denies other symptoms or problems.   RN Note: Pt had very hard stooll yesterday and today after digging herself out. Sm amt of rectal bleeding after that. Prior to yesterday had not had a BM in 5-6 days and that was difficult to get out as well. Did a fleets enema last Tuesday with minimal results. Denies vag bleeding or discharge. Reports good fetal movement.          OB History    Gravida Para Term Preterm AB TAB SAB Ectopic Multiple Living   0 2 0 2 0 0 2      Past Medical History  Diagnosis Date  . H/O drug abuse     crack, clean for 3 months  . Trichomonas   . GERD (gastroesophageal reflux disease)   . Hypertension   . Pregnancy induced hypertension     Past Surgical History  Procedure Laterality Date  . Cesarean section  03/19/2012    Procedure: CESAREAN SECTION;  Surgeon: Antionette Char, MD;  Location: [facility];  Service: Obstetrics;  Laterality: N/A;    Family History  Problem Relation Age of Onset  . Anesthesia problems Neg Hx   . Hypotension Neg Hx   . Malignant hyperthermia Neg Hx   . Pseudochol deficiency Neg Hx   . Cancer Mother   . Diabetes Father   . Cancer Maternal Grandmother     History  Substance Use Topics  . Smoking status: Former Smoker -- 0.01 packs/day    Types: Cigarettes    Quit date: 05/28/2014  . Smokeless tobacco: Never Used  . Alcohol Use: No    Allergies: No Known Allergies  Prescriptions prior to admission  Medication Sig Dispense Refill Last Dose  . acetaminophen  (TYLENOL) 500 MG tablet Take 1,000 mg by mouth every 6 (six) hours as needed for headache.    Taking  . acetaminophen-codeine (TYLENOL #3) 300-30 MG per tablet Take 1 tablet by mouth every 4 (four) hours as needed for moderate pain.   Taking  . docusate sodium (COLACE) 100 MG capsule Take 100 mg by mouth 2 (two) times daily.   Taking  . docusate sodium (COLACE) 100 MG capsule Take 1 capsule (100 mg total) by mouth 2 (two) times daily as needed. 30 capsule 2   . mineral oil enema Place 133 mLs (1 enema total) rectally once. 133 mL 0   . Prenatal Vit-Fe Fumarate-FA (PRENATAL VITAMIN) 27-0.8 MG TABS Take 1 tablet by mouth daily. 30 tablet 6 Taking  . promethazine (PHENERGAN) 12.5 MG tablet Take 1 tablet (12.5 mg total) by mouth every 6 (six) hours as needed for nausea or vomiting. 15 tablet 0   . promethazine (PHENERGAN) 25 MG tablet Take 1 tablet (25 mg total) by mouth every 6 (six) hours as needed for nausea. 10 tablet 0     Review of Systems  Constitutional: Negative for fever, chills and malaise/fatigue.  Gastrointestinal: Positive for constipation. Negative for nausea, vomiting, abdominal pain, diarrhea  and blood in stool.  Genitourinary: Negative for dysuria.  Musculoskeletal: Negative for myalgias.  Neurological: Negative for focal weakness, weakness and headaches.   Physical Exam   Blood pressure 131/75, pulse 85, temperature 98.1 F (36.7 C), temperature source Oral, resp. rate 16, last menstrual period 04/16/2014.  Physical Exam  Constitutional: She is oriented to person, place, and time. She appears well-developed and well-nourished. No distress.  HENT:  Head: Normocephalic.  Cardiovascular: Normal rate and regular rhythm.   Respiratory: Effort normal.  GI: Soft. She exhibits no distension. There is no tenderness. There is no rebound and no guarding.  Musculoskeletal: Normal range of motion.  Neurological: She is alert and oriented to person, place, and time.  Skin: Skin is  warm and dry.  Psychiatric: She has a normal mood and affect.   FHR 152  MAU Course  Procedures  MDM No results found for this or any previous visit (from the past 72 hour(s)). Warm saline enema given by RN with very good results. Patient states she feels sore but better  Assessment and Plan  A:  SIUP at 2143w2d       Constipation  P;  Enema given      Rx Miralax for home use       Discussed high fiber diet with increased fluids      Follow up in clinic as scheduled  [facility] Center[facility]ILLIAMS,Tahliyah Anagnos 08/27/2014, 3:48 PM

## 2014-08-27 NOTE — Discharge Instructions (Signed)

## 2014-08-27 NOTE — MAU Note (Signed)
Pt had very hard stooll yesterday and today after digging herself out. Sm amt of rectal bleeding after that.  Prior to yesterday had not had a BM in 5-6 days and that was difficult to get out as well.  Did a fleets enema last Tuesday with minimal results. Denies vag bleeding or discharge.  Reports good fetal movement.

## 2014-08-29 ENCOUNTER — Ambulatory Visit (HOSPITAL_COMMUNITY): Payer: Medicaid Other

## 2014-08-29 ENCOUNTER — Ambulatory Visit (HOSPITAL_COMMUNITY)
Admission: RE | Admit: 2014-08-29 | Discharge: 2014-08-29 | Disposition: A | Discharge status: Home / Self Care | Payer: Medicaid Other | Source: Ambulatory Visit | Attending: Advanced Practice Midwife | Admitting: Advanced Practice Midwife

## 2014-08-29 DIAGNOSIS — Z36 Encounter for antenatal screening of mother: Secondary | ICD-10-CM | POA: Insufficient documentation

## 2014-08-29 DIAGNOSIS — Z3A19 19 weeks gestation of pregnancy: Secondary | ICD-10-CM | POA: Insufficient documentation

## 2014-08-29 DIAGNOSIS — Z3492 Encounter for supervision of normal pregnancy, unspecified, second trimester: Secondary | ICD-10-CM

## 2014-08-29 DIAGNOSIS — Z3689 Encounter for other specified antenatal screening: Secondary | ICD-10-CM | POA: Insufficient documentation

## 2014-08-29 DIAGNOSIS — O09529 Supervision of elderly multigravida, unspecified trimester: Secondary | ICD-10-CM | POA: Insufficient documentation

## 2014-09-20 ENCOUNTER — Ambulatory Visit (INDEPENDENT_AMBULATORY_CARE_PROVIDER_SITE_OTHER): Payer: Medicaid Other | Admitting: Certified Nurse Midwife

## 2014-09-20 VITALS — BP 122/60 | HR 79 | Wt 153.0 lb

## 2014-09-20 DIAGNOSIS — Z3492 Encounter for supervision of normal pregnancy, unspecified, second trimester: Secondary | ICD-10-CM

## 2014-09-20 DIAGNOSIS — O219 Vomiting of pregnancy, unspecified: Secondary | ICD-10-CM

## 2014-09-20 LAB — POCT URINALYSIS DIP (DEVICE)
Bilirubin Urine: NEGATIVE
Glucose, UA: NEGATIVE mg/dL
Ketones, ur: NEGATIVE mg/dL
Leukocytes, UA: NEGATIVE
Nitrite: NEGATIVE
Protein, ur: NEGATIVE mg/dL
Specific Gravity, Urine: 1.025 (ref 1.005–1.030)
Urobilinogen, UA: 0.2 mg/dL (ref 0.0–1.0)
pH: 6.5 (ref 5.0–8.0)

## 2014-09-20 MED ORDER — POLYETHYLENE GLYCOL 3350 17 G PO PACK
17.0000 g | PACK | Freq: Every day | ORAL | Status: DC
Start: 2014-09-20 — End: 2014-10-19

## 2014-09-20 MED ORDER — PROMETHAZINE HCL 25 MG PO TABS: 25.0000 mg | ORAL_TABLET | Freq: Four times a day (QID) | ORAL | Status: DC | PRN

## 2014-09-20 MED ORDER — PROMETHAZINE HCL 12.5 MG PO TABS: 12.5000 mg | ORAL_TABLET | Freq: Four times a day (QID) | ORAL | Status: DC | PRN

## 2014-09-20 NOTE — Progress Notes (Signed)
Called [facility] to cancel phenergan 12.5 mg per Lawson FiscalLori.

## 2014-09-20 NOTE — Patient Instructions (Signed)
Constipation  Constipation is when a person has fewer than three bowel movements a week, has difficulty having a bowel movement, or has stools that are dry, hard, or larger than normal. As people grow older, constipation is more common. If you try to fix constipation with medicines that make you have a bowel movement (laxatives), the problem may get worse. Long-term laxative use may cause the muscles of the colon to become weak. A low-fiber diet, not taking in enough fluids, and taking certain medicines may make constipation worse.   CAUSES   · Certain medicines, such as antidepressants, pain medicine, iron supplements, antacids, and water pills.    · Certain diseases, such as diabetes, irritable bowel syndrome (IBS), thyroid disease, or depression.    · Not drinking enough water.    · Not eating enough fiber-rich foods.    · Stress or travel.    · Lack of physical activity or exercise.    · Ignoring the urge to have a bowel movement.    · Using laxatives too much.    SIGNS AND SYMPTOMS   · Having fewer than three bowel movements a week.    · Straining to have a bowel movement.    · Having stools that are hard, dry, or larger than normal.    · Feeling full or bloated.    · Pain in the lower abdomen.    · Not feeling relief after having a bowel movement.    DIAGNOSIS   Your health care provider will take a medical history and perform a physical exam. Further testing may be done for severe constipation. Some tests may include:  · A barium enema X-ray to examine your rectum, colon, and, sometimes, your small intestine.    · A sigmoidoscopy to examine your lower colon.    · A colonoscopy to examine your entire colon.  TREATMENT   Treatment will depend on the severity of your constipation and what is causing it. Some dietary treatments include drinking more fluids and eating more fiber-rich foods. Lifestyle treatments may include regular exercise. If these diet and lifestyle recommendations do not help, your health care  provider may recommend taking over-the-counter laxative medicines to help you have bowel movements. Prescription medicines may be prescribed if over-the-counter medicines do not work.   HOME CARE INSTRUCTIONS   · Eat foods that have a lot of fiber, such as fruits, vegetables, whole grains, and beans.  · Limit foods high in fat and processed sugars, such as french fries, hamburgers, cookies, candies, and soda.    · A fiber supplement may be added to your diet if you cannot get enough fiber from foods.    · Drink enough fluids to keep your urine clear or pale yellow.    · Exercise regularly or as directed by your health care provider.    · Go to the restroom when you have the urge to go. Do not hold it.    · Only take over-the-counter or prescription medicines as directed by your health care provider. Do not take other medicines for constipation without talking to your health care provider first.    SEEK IMMEDIATE MEDICAL CARE IF:   · You have bright red blood in your stool.    · Your constipation lasts for more than 4 days or gets worse.    · You have abdominal or rectal pain.    · You have thin, pencil-like stools.    · You have unexplained weight loss.  MAKE SURE YOU:   · Understand these instructions.  · Will watch your condition.  · Will get help right away if you are not   you have with your health care provider. Morning Sickness Morning sickness is when you feel sick to your stomach (nauseous) during pregnancy. This nauseous feeling may or may not come with vomiting. It often occurs in the morning but can be a problem any time of day. Morning sickness is most common during the first trimester, but it may continue  throughout pregnancy. While morning sickness is unpleasant, it is usually harmless unless you develop severe and continual vomiting (hyperemesis gravidarum). This condition requires more intense treatment.  CAUSES  The cause of morning sickness is not completely known but seems to be related to normal hormonal changes that occur in pregnancy. RISK FACTORS You are at greater risk if you:  Experienced nausea or vomiting before your pregnancy.  Had morning sickness during a previous pregnancy.  Are pregnant with more than one baby, such as twins. TREATMENT  Do not use any medicines (prescription, over-the-counter, or herbal) for morning sickness without first talking to your health care provider. Your health care provider may prescribe or recommend:  Vitamin B6 supplements.  Anti-nausea medicines.  The herbal medicine ginger. HOME CARE INSTRUCTIONS   Only take over-the-counter or prescription medicines as directed by your health care provider.  Taking multivitamins before getting pregnant can prevent or decrease the severity of morning sickness in most women.  Eat a piece of dry toast or unsalted crackers before getting out of bed in the morning.  Eat five or six small meals a day.  Eat dry and bland foods (rice, baked potato). Foods high in carbohydrates are often helpful.  Do not drink liquids with your meals. Drink liquids between meals.  Avoid greasy, fatty, and spicy foods.  Get someone to cook for you if the smell of any food causes nausea and vomiting.  If you feel nauseous after taking prenatal vitamins, take the vitamins at night or with a snack.  Snack on protein foods (nuts, yogurt, cheese) between meals if you are hungry.  Eat unsweetened gelatins for desserts.  Wearing an acupressure wristband (worn for sea sickness) may be helpful.  Acupuncture may be helpful.  Do not smoke.  Get a humidifier to keep the air in your house free of odors.  Get plenty of  fresh air. SEEK MEDICAL CARE IF:   Your home remedies are not working, and you need medicine.  You feel dizzy or lightheaded.  You are losing weight. SEEK IMMEDIATE MEDICAL CARE IF:   You have persistent and uncontrolled nausea and vomiting.  You pass out (faint). MAKE SURE YOU:  Understand these instructions.  Will watch your condition.  Will get help right away if you are not doing well or get worse. Document Released: 07/17/2006 Document Revised: 05/31/2013 Document Reviewed: 11/10/2012 [facility] Patient Information 2015 [place], [place]. This information is not intended to replace advice given to you by your health care provider. Make sure you discuss any questions you have with your health care provider.

## 2014-09-20 NOTE — Progress Notes (Signed)
Hgb: Trace 

## 2014-09-20 NOTE — Progress Notes (Signed)
Needs refill on nausea meds.

## 2014-10-18 ENCOUNTER — Encounter: Payer: Medicaid Other | Admitting: Family

## 2014-10-19 ENCOUNTER — Encounter (HOSPITAL_COMMUNITY): Payer: Self-pay | Admitting: *Deleted

## 2014-10-19 ENCOUNTER — Inpatient Hospital Stay (HOSPITAL_COMMUNITY)
Admission: AD | Admit: 2014-10-19 | Discharge: 2014-10-19 | Disposition: A | Discharge status: Home / Self Care | Payer: Medicaid Other | Source: Ambulatory Visit | Attending: Obstetrics & Gynecology | Admitting: Obstetrics & Gynecology

## 2014-10-19 DIAGNOSIS — K5900 Constipation, unspecified: Secondary | ICD-10-CM | POA: Insufficient documentation

## 2014-10-19 DIAGNOSIS — O9989 Other specified diseases and conditions complicating pregnancy, childbirth and the puerperium: Secondary | ICD-10-CM | POA: Diagnosis not present

## 2014-10-19 DIAGNOSIS — Z3A26 26 weeks gestation of pregnancy: Secondary | ICD-10-CM | POA: Insufficient documentation

## 2014-10-19 DIAGNOSIS — F17210 Nicotine dependence, cigarettes, uncomplicated: Secondary | ICD-10-CM | POA: Diagnosis not present

## 2014-10-19 DIAGNOSIS — O99332 Smoking (tobacco) complicating pregnancy, second trimester: Secondary | ICD-10-CM | POA: Diagnosis not present

## 2014-10-19 MED ORDER — MINERAL OIL RE ENEM: 1.0000 | ENEMA | Freq: Once | RECTAL | Status: DC

## 2014-10-19 MED ORDER — POLYETHYLENE GLYCOL 3350 17 GM/SCOOP PO POWD: 17.0000 g | Freq: Every day | ORAL | Status: DC

## 2014-10-19 NOTE — MAU Provider Note (Signed)
History     CSN: [id number]  Arrival date and time: 10/19/14 1032   First Provider Initiated Contact with Patient 10/19/14 1125      Chief Complaint  Patient presents with  . Decreased Fetal Movement  . Constipation   HPI  Patient is 39 y.o. [id number] here with complaints of constipation.  She reports that she has not had a BM in over a week.  Normally has a BM daily.  She reports that she had an enema in April that helped when she had this before.  She reports that she has been stressed.  She states that she has been increasing fiber but has not been taking the Miralax that was prescribed to her daily.  She notes that she has not been keeping as well hydrated as she normally does and that her urine is dark yellow.  Denies hematuria, dysuria, frequency or urgency.  Denies fevers, chills.  Denies vomiting, decreased PO.  +FM, denies LOF, VB, contractions, vaginal discharge.  OB History    Gravida Para Term Preterm AB TAB SAB Ectopic Multiple Living   5 2 2  0 2 0 2 0 0 2      Past Medical History  Diagnosis Date  . H/O drug abuse     crack, clean for 3 months  . Trichomonas   . GERD (gastroesophageal reflux disease)   . Hypertension   . Pregnancy induced hypertension     Past Surgical History  Procedure Laterality Date  . Cesarean section  03/19/2012    Procedure: CESAREAN SECTION;  Surgeon: Antionette CharLisa Jackson-Moore, MD;  Location: [facility];  Service: Obstetrics;  Laterality: N/A;    Family History  Problem Relation Age of Onset  . Anesthesia problems Neg Hx   . Hypotension Neg Hx   . Malignant hyperthermia Neg Hx   . Pseudochol deficiency Neg Hx   . Cancer Mother   . Diabetes Father   . Cancer Maternal Grandmother     History  Substance Use Topics  . Smoking status: Current Some Day Smoker -- 0.01 packs/day    Types: Cigarettes    Last Attempt to Quit: 05/28/2014  . Smokeless tobacco: Never Used  . Alcohol Use: No    Allergies: No Known  Allergies  Prescriptions prior to admission  Medication Sig Dispense Refill Last Dose  . acetaminophen (TYLENOL) 500 MG tablet Take 1,000 mg by mouth every 6 (six) hours as needed for headache.    10/18/2014 at Unknown time  . docusate sodium (COLACE) 100 MG capsule Take 1 capsule (100 mg total) by mouth 2 (two) times daily as needed. 30 capsule 2 Past Week at Unknown time  . mineral oil enema Place 133 mLs (1 enema total) rectally once. (Patient not taking: Reported on 09/20/2014) 133 mL 0 Not Taking  . polyethylene glycol (MIRALAX) packet Take 17 g by mouth daily. (Patient not taking: Reported on 10/19/2014) 14 each 0   . Prenatal Vit-Fe Fumarate-FA (PRENATAL VITAMIN) 27-0.8 MG TABS Take 1 tablet by mouth daily. (Patient not taking: Reported on 09/20/2014) 30 tablet 6 Not Taking  . promethazine (PHENERGAN) 25 MG tablet Take 1 tablet (25 mg total) by mouth every 6 (six) hours as needed for nausea. 10 tablet 0     Review of Systems  Constitutional: Negative for fever and chills.  Respiratory: Negative for cough and shortness of breath.   Cardiovascular: Negative for chest pain and orthopnea.  Gastrointestinal: Positive for constipation. Negative for heartburn, nausea, vomiting, abdominal pain  and blood in stool.  Genitourinary: Negative for dysuria, urgency, frequency, hematuria and flank pain.       +FM, no LOF, VB, vaginal discharge, contractions   Physical Exam   Blood pressure 118/59, pulse 65, temperature 97.4 F (36.3 C), temperature source Oral, resp. rate 18, last menstrual period 04/16/2014.  Physical Exam  Constitutional: She is oriented to person, place, and time. She appears well-developed and well-nourished. No distress.  HENT:  Head: Normocephalic and atraumatic.  Eyes: EOM are normal. No scleral icterus.  Neck: Normal range of motion. Neck supple.  Cardiovascular: Normal rate, regular rhythm, normal heart sounds and intact distal pulses.   Respiratory: Effort normal and  breath sounds normal.  GI: Soft. Bowel sounds are normal. There is no tenderness. There is no rebound and no guarding.  gravid  Musculoskeletal: Normal range of motion. She exhibits no edema.  Neurological: She is alert and oriented to person, place, and time.  Skin: Skin is warm and dry.  Psychiatric: She has a normal mood and affect. Her behavior is normal.   Fetal monitoringBaseline: 140 bpm, Variability: Good {> 6 bpm) and Accelerations: appropriate for gestational age Uterine activity: uterine irritation  MAU Course  Procedures  MDM NST reassuring for GA  Assessment and Plan  Constipation in pregnancy -Encouraged PO hydration and fiber -Fleet enema and Miralax prescribed -Return precautions reviewed -Patient to follow up with OB as scheduled   Delynn FlavinGottschalk, Ashly M, DO 10/19/2014, 11:26 AM   OB fellow attestation:  I have seen and examined this patient; I agree with above documentation in the resident's note.   Meda KlinefelterFabrienne Y Leatherbury is a 39 y.o. [phone] reporting constipation.  Requests enema, as this worked well for her at last visit to MAU.  Not taking miralax as previously prescribed +FM, denies LOF, VB, contractions, vaginal discharge.  PE: BP 129/59 mmHg  Pulse 66  Temp(Src) 97.4 F (36.3 C) (Oral)  Resp 18  LMP 04/16/2014 Gen: calm comfortable, NAD Resp: normal effort, no distress Abd: gravid  ROS, labs, PMH reviewed NST reactive   Plan: - fetal kick counts reinforced - encouraged to eat more fiber, TAKE miralax daily (up to 3x daily for severe constipation) and to return for PO intolerance/abdominal pain - continue routine follow up in OB clinic - no enema today as pt very stable and capable of administering at home, rx for enema given  Perry MountACOSTA,Cierria Height ROCIO, MD 7:32 PM

## 2014-10-19 NOTE — MAU Note (Signed)
Urine in lab 

## 2014-10-19 NOTE — MAU Note (Signed)
Pt states she has had decreased FM for the past week, also C/O constipation.  Last BM was a week ago.  Denies uc's, bleeding or LOF.  Has thick white discharge, irritation - denies itching or odor.

## 2014-10-19 NOTE — Discharge Instructions (Signed)
Drink plenty of water.  Continue to take Miralax daily.   Safe Medications in Pregnancy   Acne: Benzoyl Peroxide Salicylic Acid  Backache/Headache: Tylenol: 2 regular strength every 4 hours OR              2 Extra strength every 6 hours  Colds/Coughs/Allergies: Benadryl (alcohol free) 25 mg every 6 hours as needed Breath right strips Claritin Cepacol throat lozenges Chloraseptic throat spray Cold-Eeze- up to three times per day Cough drops, alcohol free Flonase (by prescription only) Guaifenesin Mucinex Robitussin DM (plain only, alcohol free) Saline nasal spray/drops Sudafed (pseudoephedrine) & Actifed ** use only after [redacted] weeks gestation and if you do not have high blood pressure Tylenol Vicks Vaporub Zinc lozenges Zyrtec   Constipation: Colace Ducolax suppositories Fleet enema Glycerin suppositories Metamucil Milk of magnesia Miralax Senokot Smooth move tea  Diarrhea: Kaopectate Imodium A-D  *NO pepto Bismol  Hemorrhoids: Anusol Anusol HC Preparation H Tucks  Indigestion: Tums Maalox Mylanta Zantac  Pepcid  Insomnia: Benadryl (alcohol free) 25mg  every 6 hours as needed Tylenol PM Unisom, no Gelcaps  Leg Cramps: Tums MagGel  Nausea/Vomiting:  Bonine Dramamine Emetrol Ginger extract Sea bands Meclizine  Nausea medication to take during pregnancy:  Unisom (doxylamine succinate 25 mg tablets) Take one tablet daily at bedtime. If symptoms are not adequately controlled, the dose can be increased to a maximum recommended dose of two tablets daily (1/2 tablet in the morning, 1/2 tablet mid-afternoon and one at bedtime). Vitamin B6 100mg  tablets. Take one tablet twice a day (up to 200 mg per day).  Skin Rashes: Aveeno products Benadryl cream or 25mg  every 6 hours as needed Calamine Lotion 1% cortisone cream  Yeast infection: Gyne-lotrimin 7 Monistat 7   **If taking multiple medications, please check labels to avoid duplicating the  same active ingredients **take medication as directed on the label ** Do not exceed 4000 mg of tylenol in 24 hours **Do not take medications that contain aspirin or ibuprofen     Constipation Constipation is when a person:  Poops (has a bowel movement) less than 3 times a week.  Has a hard time pooping.  Has poop that is dry, hard, or bigger than normal. HOME CARE   Eat foods with a lot of fiber in them. This includes fruits, vegetables, beans, and whole grains such as brown rice.  Avoid fatty foods and foods with a lot of sugar. This includes french fries, hamburgers, cookies, candy, and soda.  If you are not getting enough fiber from food, take products with added fiber in them (supplements).  Drink enough fluid to keep your pee (urine) clear or pale yellow.  Exercise on a regular basis, or as told by your doctor.  Go to the restroom when you feel like you need to poop. Do not hold it.  Only take medicine as told by your doctor. Do not take medicines that help you poop (laxatives) without talking to your doctor first. GET HELP RIGHT AWAY IF:   You have bright red blood in your poop (stool).  Your constipation lasts more than 4 days or gets worse.  You have belly (abdominal) or butt (rectal) pain.  You have thin poop (as thin as a pencil).  You lose weight, and it cannot be explained. MAKE SURE YOU:   Understand these instructions.  Will watch your condition.  Will get help right away if you are not doing well or get worse. Document Released: 11/12/2007 Document Revised: 05/31/2013 Document Reviewed: 03/07/2013  ExitCare® Patient Information ©2015 Exit[employer], LLC. This information is not intended to replace advice given to you by your health care provider. Make sure you discuss any questions you have with your health care provider. ° °

## 2014-10-19 NOTE — MAU Note (Signed)
Pt states she had BM while in MAU.

## 2014-11-01 ENCOUNTER — Encounter: Payer: Medicaid Other | Admitting: Certified Nurse Midwife

## 2014-11-16 ENCOUNTER — Encounter: Payer: Medicaid Other | Admitting: Family

## 2014-11-23 ENCOUNTER — Encounter: Payer: Self-pay | Admitting: *Deleted

## 2014-11-23 ENCOUNTER — Ambulatory Visit (INDEPENDENT_AMBULATORY_CARE_PROVIDER_SITE_OTHER): Payer: Medicaid Other | Admitting: Obstetrics and Gynecology

## 2014-11-23 VITALS — BP 140/82 | HR 71 | Wt 142.1 lb

## 2014-11-23 DIAGNOSIS — Z23 Encounter for immunization: Secondary | ICD-10-CM | POA: Diagnosis not present

## 2014-11-23 DIAGNOSIS — O09523 Supervision of elderly multigravida, third trimester: Secondary | ICD-10-CM

## 2014-11-23 DIAGNOSIS — O99323 Drug use complicating pregnancy, third trimester: Secondary | ICD-10-CM | POA: Diagnosis not present

## 2014-11-23 DIAGNOSIS — O0973 Supervision of high risk pregnancy due to social problems, third trimester: Secondary | ICD-10-CM | POA: Diagnosis present

## 2014-11-23 DIAGNOSIS — F1990 Other psychoactive substance use, unspecified, uncomplicated: Secondary | ICD-10-CM | POA: Diagnosis not present

## 2014-11-23 DIAGNOSIS — F1911 Other psychoactive substance abuse, in remission: Secondary | ICD-10-CM

## 2014-11-23 DIAGNOSIS — Z3483 Encounter for supervision of other normal pregnancy, third trimester: Secondary | ICD-10-CM

## 2014-11-23 LAB — POCT URINALYSIS DIP (DEVICE)
Bilirubin Urine: NEGATIVE
Glucose, UA: 100 mg/dL — AB
Ketones, ur: NEGATIVE mg/dL
Nitrite: NEGATIVE
Protein, ur: 30 mg/dL — AB
Specific Gravity, Urine: 1.025 (ref 1.005–1.030)
Urobilinogen, UA: 0.2 mg/dL (ref 0.0–1.0)
pH: 7 (ref 5.0–8.0)

## 2014-11-23 LAB — CBC
HCT: 36.8 % (ref 36.0–46.0)
Hemoglobin: 12.4 g/dL (ref 12.0–15.0)
MCH: 30.2 pg (ref 26.0–34.0)
MCHC: 33.7 g/dL (ref 30.0–36.0)
MCV: 89.5 fL (ref 78.0–100.0)
MPV: 9.4 fL (ref 8.6–12.4)
PLATELETS: 263 10*3/uL (ref 150–400)
RBC: 4.11 MIL/uL (ref 3.87–5.11)
RDW: 14.1 % (ref 11.5–15.5)
WBC: 6.2 10*3/uL (ref 4.0–10.5)

## 2014-11-23 MED ORDER — TETANUS-DIPHTH-ACELL PERTUSSIS 5-2.5-18.5 LF-MCG/0.5 IM SUSP
0.5000 mL | Freq: Once | INTRAMUSCULAR | Status: AC
  Administered 2014-11-23: 0.5 mL via INTRAMUSCULAR

## 2014-11-23 NOTE — Progress Notes (Signed)
Follow up ultrasound scheduled for June 21,2016 @ 11:00AM. Medicaid papers for tubal ligation signed. Name verified in [employer]port.

## 2014-11-23 NOTE — Progress Notes (Signed)
Subjective:  Lindsey [name] is a 39 y.o. [phone] at [redacted]w[redacted]d being seen today for ongoing prenatal care.  Patient reports no complaints.  Contractions: Not present.  Vag. Bleeding: None. Movement: Present. Denies leaking of fluid. Patient reports being under a lot of stress secondary to being homeless  The following portions of the patient's history were reviewed and updated as appropriate: allergies, current medications, past family history, past medical history, past social history, past surgical history and problem list.   Objective:   Filed Vitals:   11/23/14 1030 11/23/14 1057  BP: 159/77 140/82  Pulse: 71   Weight: 142 lb 1.6 oz (64.456 kg)     Fetal Status: Fetal Heart Rate (bpm): 141   Movement: Present     General:  Alert, oriented and cooperative. Patient is in no acute distress.  Skin: Skin is warm and dry. No rash noted.   Cardiovascular: Normal heart rate noted  Respiratory: Effort and breath sounds normal, no problems with respiration noted  Abdomen: Soft, gravid, appropriate for gestational age. Pain/Pressure: Absent     Vaginal: Vag. Bleeding: None.    Vag D/C Character: White  Cervix: Not evaluated  Extremities: Normal range of motion.  Edema: Trace  Mental Status: Normal mood and affect. Normal behavior. Normal judgment and thought content.   Urinalysis: Urine Protein: 1+ Urine Glucose: 1+  Assessment and Plan:  Pregnancy: [id number] at 108w4d  1. Encounter for supervision of other normal pregnancy in third trimester - CBC - RPR - HIV antibody (with reflex) - Glucose Tolerance, 1 HR (50g) w/o Fasting - US OB Follow Up; Future - Will have patient see SW  2. Maternal age 23+, multigravida, antepartum, third trimester -BTL papers to be signed today  3. Hx of drug abuse    Preterm labor symptoms and general obstetric precautions including but not limited to vaginal bleeding, contractions, leaking of fluid and fetal movement were reviewed in detail with the  patient.  Please refer to After Visit Summary for other counseling recommendations.   Return in about 2 weeks (around 12/07/2014).   Catalina Antigua, MD

## 2014-11-23 NOTE — Progress Notes (Signed)
Patient reports currently homeless, very stressed and upset lately. Reports concern with her swelling/fluid  Reports vaginal irritation

## 2014-11-24 LAB — HIV ANTIBODY (ROUTINE TESTING W REFLEX): HIV: NONREACTIVE

## 2014-11-24 LAB — RPR

## 2014-11-24 LAB — GLUCOSE TOLERANCE, 1 HOUR (50G) W/O FASTING: Glucose, 1 Hour GTT: 86 mg/dL (ref 70–140)

## 2014-11-28 ENCOUNTER — Inpatient Hospital Stay (HOSPITAL_COMMUNITY)
Admit: 2014-11-28 | Discharge: 2014-11-28 | Disposition: A | Discharge status: Home / Self Care | Payer: Medicaid Other | Attending: Family Medicine | Admitting: Family Medicine

## 2014-11-28 ENCOUNTER — Ambulatory Visit (HOSPITAL_COMMUNITY)
Admission: RE | Admit: 2014-11-28 | Discharge: 2014-11-28 | Disposition: A | Discharge status: Home / Self Care | Payer: Medicaid Other | Source: Ambulatory Visit | Attending: Obstetrics and Gynecology | Admitting: Obstetrics and Gynecology

## 2014-11-28 ENCOUNTER — Inpatient Hospital Stay (HOSPITAL_COMMUNITY)
Admission: AD | Admit: 2014-11-28 | Discharge: 2014-11-28 | Disposition: A | Discharge status: Home / Self Care | Payer: Medicaid Other | Source: Ambulatory Visit | Attending: Family Medicine | Admitting: Family Medicine

## 2014-11-28 ENCOUNTER — Encounter (HOSPITAL_COMMUNITY): Payer: Self-pay

## 2014-11-28 DIAGNOSIS — Z3A32 32 weeks gestation of pregnancy: Secondary | ICD-10-CM | POA: Diagnosis not present

## 2014-11-28 DIAGNOSIS — Z3483 Encounter for supervision of other normal pregnancy, third trimester: Secondary | ICD-10-CM

## 2014-11-28 DIAGNOSIS — O09523 Supervision of elderly multigravida, third trimester: Secondary | ICD-10-CM | POA: Diagnosis not present

## 2014-11-28 DIAGNOSIS — O99333 Smoking (tobacco) complicating pregnancy, third trimester: Secondary | ICD-10-CM | POA: Diagnosis not present

## 2014-11-28 DIAGNOSIS — N898 Other specified noninflammatory disorders of vagina: Secondary | ICD-10-CM | POA: Diagnosis not present

## 2014-11-28 DIAGNOSIS — F17210 Nicotine dependence, cigarettes, uncomplicated: Secondary | ICD-10-CM | POA: Diagnosis not present

## 2014-11-28 DIAGNOSIS — O3421 Maternal care for scar from previous cesarean delivery: Secondary | ICD-10-CM | POA: Insufficient documentation

## 2014-11-28 DIAGNOSIS — O1203 Gestational edema, third trimester: Secondary | ICD-10-CM | POA: Diagnosis not present

## 2014-11-28 DIAGNOSIS — O09293 Supervision of pregnancy with other poor reproductive or obstetric history, third trimester: Secondary | ICD-10-CM | POA: Insufficient documentation

## 2014-11-28 DIAGNOSIS — F1910 Other psychoactive substance abuse, uncomplicated: Secondary | ICD-10-CM

## 2014-11-28 DIAGNOSIS — O99323 Drug use complicating pregnancy, third trimester: Secondary | ICD-10-CM | POA: Insufficient documentation

## 2014-11-28 DIAGNOSIS — O09529 Supervision of elderly multigravida, unspecified trimester: Secondary | ICD-10-CM | POA: Insufficient documentation

## 2014-11-28 DIAGNOSIS — A599 Trichomoniasis, unspecified: Secondary | ICD-10-CM

## 2014-11-28 LAB — C-REACTIVE PROTEIN: CRP: 0.9 mg/dL (ref <1.0)

## 2014-11-28 LAB — COMPREHENSIVE METABOLIC PANEL
ALT: 26 U/L (ref 14–54)
ANION GAP: 4 — AB (ref 5–15)
AST: 31 U/L (ref 15–41)
Albumin: 2.6 g/dL — ABNORMAL LOW (ref 3.5–5.0)
Alkaline Phosphatase: 87 U/L (ref 38–126)
BUN: 5 mg/dL — ABNORMAL LOW (ref 6–20)
CO2: 23 mmol/L (ref 22–32)
CREATININE: 0.63 mg/dL (ref 0.44–1.00)
Calcium: 8.3 mg/dL — ABNORMAL LOW (ref 8.9–10.3)
Chloride: 111 mmol/L (ref 101–111)
GFR calc Af Amer: >60 mL/min (ref >60)
GFR calc non Af Amer: >60 mL/min (ref >60)
Glucose, Bld: 106 mg/dL — ABNORMAL HIGH (ref 65–99)
Potassium: 3.7 mmol/L (ref 3.5–5.1)
SODIUM: 138 mmol/L (ref 135–145)
TOTAL PROTEIN: 5.6 g/dL — AB (ref 6.5–8.1)
Total Bilirubin: 0.3 mg/dL (ref 0.3–1.2)

## 2014-11-28 LAB — URINALYSIS, ROUTINE W REFLEX MICROSCOPIC
BILIRUBIN URINE: NEGATIVE
Glucose, UA: NEGATIVE mg/dL
HGB URINE DIPSTICK: NEGATIVE
Ketones, ur: NEGATIVE mg/dL
Nitrite: NEGATIVE
Protein, ur: 30 mg/dL — AB
SPECIFIC GRAVITY, URINE: 1.02 (ref 1.005–1.030)
Urobilinogen, UA: 1 mg/dL (ref 0.0–1.0)
pH: 8 (ref 5.0–8.0)

## 2014-11-28 LAB — CBC
HEMATOCRIT: 33.4 % — AB (ref 36.0–46.0)
Hemoglobin: 11.2 g/dL — ABNORMAL LOW (ref 12.0–15.0)
MCH: 29.7 pg (ref 26.0–34.0)
MCHC: 33.5 g/dL (ref 30.0–36.0)
MCV: 88.6 fL (ref 78.0–100.0)
PLATELETS: 227 10*3/uL (ref 150–400)
RBC: 3.77 MIL/uL — ABNORMAL LOW (ref 3.87–5.11)
RDW: 13.3 % (ref 11.5–15.5)
WBC: 5.7 10*3/uL (ref 4.0–10.5)

## 2014-11-28 LAB — WET PREP, GENITAL
Trich, Wet Prep: NONE SEEN
YEAST WET PREP: NONE SEEN

## 2014-11-28 LAB — PROTEIN / CREATININE RATIO, URINE
Creatinine, Urine: 245 mg/dL
Protein Creatinine Ratio: 0.17 mg/mg{Cre} — ABNORMAL HIGH (ref 0.00–0.15)
Total Protein, Urine: 42 mg/dL

## 2014-11-28 LAB — RAPID URINE DRUG SCREEN, HOSP PERFORMED
Amphetamines: NOT DETECTED
Barbiturates: NOT DETECTED
Benzodiazepines: NOT DETECTED
Cocaine: POSITIVE — AB
OPIATES: NOT DETECTED
TETRAHYDROCANNABINOL: NOT DETECTED

## 2014-11-28 LAB — SEDIMENTATION RATE: SED RATE: 30 mm/h — AB (ref 0–22)

## 2014-11-28 LAB — URINE MICROSCOPIC-ADD ON

## 2014-11-28 MED ORDER — CLINDAMYCIN HCL 300 MG PO CAPS: 300.0000 mg | ORAL_CAPSULE | Freq: Four times a day (QID) | ORAL | Status: AC

## 2014-11-28 MED ORDER — METRONIDAZOLE 500 MG PO TABS
2000.0000 mg | ORAL_TABLET | Freq: Once | ORAL | Status: AC
  Administered 2014-11-28: 2000 mg via ORAL
  Filled 2014-11-28: qty 4

## 2014-11-28 MED ORDER — OXYCODONE-ACETAMINOPHEN 5-325 MG PO TABS: 1.0000 | ORAL_TABLET | Freq: Four times a day (QID) | ORAL | Status: DC | PRN

## 2014-11-28 MED ORDER — OXYCODONE-ACETAMINOPHEN 5-325 MG PO TABS
1.0000 | ORAL_TABLET | Freq: Once | ORAL | Status: AC
  Administered 2014-11-28: 1 via ORAL
  Filled 2014-11-28: qty 1

## 2014-11-28 NOTE — Progress Notes (Signed)
Patient called out for pain medication notified Dr. Doroteo Glassman.

## 2014-11-28 NOTE — Progress Notes (Signed)
Notified Dr. Doroteo Glassman dopplers are negative.

## 2014-11-28 NOTE — Progress Notes (Signed)
Called [facility] lab tech to get approximate time coming over should be between 30 to 45 minutes.

## 2014-11-28 NOTE — MAU Provider Note (Signed)
History     CSN: [id number]  Arrival date and time: 11/28/14 1208  Chief Complaint  Patient presents with  . Foot Swelling  . Pelvic Pain  . Dysuria    HPI  Patient is 39 y.o. [id number] [redacted]w[redacted]d here with complaints of vaginal irritation. Patient states that symtpoms started about a week ago. She denies any history of STDs. Says pain is worse with urination. She's never had this before. She states that she last had intercourse 2 half weeks ago.  Patient also endorsing leg swelling for the last 3 months. States that her legs are really swollen and tender.   +FM, denies LOF, VB, contractions, vaginal discharge.   OB History    Gravida Para Term Preterm AB TAB SAB Ectopic Multiple Living   '5 2 2 '$ 0 2 0 2 0 0 2    -prenatal care here  Past Medical History  Diagnosis Date  . H/O drug abuse     crack, clean for 3 months  . Trichomonas   . GERD (gastroesophageal reflux disease)   . Hypertension   . Pregnancy induced hypertension     Past Surgical History  Procedure Laterality Date  . Cesarean section  03/19/2012    Procedure: CESAREAN SECTION;  Surgeon: Lahoma Crocker, MD;  Location: [facility];  Service: Obstetrics;  Laterality: N/A;    Family History  Problem Relation Age of Onset  . Anesthesia problems Neg Hx   . Hypotension Neg Hx   . Malignant hyperthermia Neg Hx   . Pseudochol deficiency Neg Hx   . Cancer Mother   . Diabetes Father   . Cancer Maternal Grandmother     History  Substance Use Topics  . Smoking status: Current Some Day Smoker -- 0.01 packs/day    Types: Cigarettes    Last Attempt to Quit: 05/28/2014  . Smokeless tobacco: Never Used  . Alcohol Use: No    Allergies: No Known Allergies  No prescriptions prior to admission    Review of Systems  Constitutional: Negative for fever and chills.  Eyes: Negative for blurred vision and double vision.  Respiratory: Negative for shortness of breath.   Cardiovascular: Positive for leg swelling.  Negative for chest pain.  Gastrointestinal: Negative for nausea and vomiting.  Genitourinary: Positive for dysuria.  Neurological: Negative for dizziness and headaches.   Physical Exam   Blood pressure 137/79, pulse 74, temperature 97.5 F (36.4 C), temperature source Oral, resp. rate 16, last menstrual period 04/16/2014.  Physical Exam  Constitutional: She is oriented to person, place, and time. She appears well-developed and well-nourished. No distress.  somnolent   HENT:  Head: Normocephalic and atraumatic.  Cardiovascular: Normal rate, regular rhythm, normal heart sounds and intact distal pulses.   Respiratory: Effort normal and breath sounds normal.  GI: Soft. Bowel sounds are normal. There is no tenderness.  Genitourinary: There is tenderness in the vagina. Vaginal discharge found.  Musculoskeletal: Normal range of motion. She exhibits edema and tenderness.       Legs: Neurological: She is alert and oriented to person, place, and time.  Skin: Skin is warm, dry and intact. There is erythema.     Results for orders placed or performed during the hospital encounter of 11/28/14 (from the past 24 hour(s))  Urinalysis, Routine w reflex microscopic (not at [facility])     Status: Abnormal   Collection Time: 11/28/14 12:20 PM  Result Value Ref Range   Color, Urine YELLOW YELLOW   APPearance CLOUDY (A) CLEAR  Specific Gravity, Urine 1.020 1.005 - 1.030   pH 8.0 5.0 - 8.0   Glucose, UA NEGATIVE NEGATIVE mg/dL   Hgb urine dipstick NEGATIVE NEGATIVE   Bilirubin Urine NEGATIVE NEGATIVE   Ketones, ur NEGATIVE NEGATIVE mg/dL   Protein, ur 30 (A) NEGATIVE mg/dL   Urobilinogen, UA 1.0 0.0 - 1.0 mg/dL   Nitrite NEGATIVE NEGATIVE   Leukocytes, UA MODERATE (A) NEGATIVE  Urine microscopic-add on     Status: Abnormal   Collection Time: 11/28/14 12:20 PM  Result Value Ref Range   Squamous Epithelial / LPF MANY (A) RARE   WBC, UA 7-10 <3 WBC/hpf   Urine-Other AMORPHOUS URATES/PHOSPHATES     Urine rapid drug screen (hosp performed)     Status: Abnormal   Collection Time: 11/28/14 12:20 PM  Result Value Ref Range   Opiates NONE DETECTED NONE DETECTED   Cocaine POSITIVE (A) NONE DETECTED   Benzodiazepines NONE DETECTED NONE DETECTED   Amphetamines NONE DETECTED NONE DETECTED   Tetrahydrocannabinol NONE DETECTED NONE DETECTED   Barbiturates NONE DETECTED NONE DETECTED  Protein / creatinine ratio, urine     Status: Abnormal   Collection Time: 11/28/14 12:20 PM  Result Value Ref Range   Creatinine, Urine 245.00 mg/dL   Total Protein, Urine 42 mg/dL   Protein Creatinine Ratio 0.17 (H) 0.00 - 0.15 mg/mg[Cre]  Wet prep, genital     Status: Abnormal   Collection Time: 11/28/14  1:50 PM  Result Value Ref Range   Yeast Wet Prep HPF POC NONE SEEN NONE SEEN   Trich, Wet Prep NONE SEEN NONE SEEN   Clue Cells Wet Prep HPF POC FEW (A) NONE SEEN   WBC, Wet Prep HPF POC FEW (A) NONE SEEN  CBC     Status: Abnormal   Collection Time: 11/28/14  3:15 PM  Result Value Ref Range   WBC 5.7 4.0 - 10.5 K/uL   RBC 3.77 (L) 3.87 - 5.11 MIL/uL   Hemoglobin 11.2 (L) 12.0 - 15.0 g/dL   HCT 33.4 (L) 36.0 - 46.0 %   MCV 88.6 78.0 - 100.0 fL   MCH 29.7 26.0 - 34.0 pg   MCHC 33.5 30.0 - 36.0 g/dL   RDW 13.3 11.5 - 15.5 %   Platelets 227 150 - 400 K/uL  Comprehensive metabolic panel     Status: Abnormal   Collection Time: 11/28/14  3:15 PM  Result Value Ref Range   Sodium 138 135 - 145 mmol/L   Potassium 3.7 3.5 - 5.1 mmol/L   Chloride 111 101 - 111 mmol/L   CO2 23 22 - 32 mmol/L   Glucose, Bld 106 (H) 65 - 99 mg/dL   BUN 5 (L) 6 - 20 mg/dL   Creatinine, Ser 0.63 0.44 - 1.00 mg/dL   Calcium 8.3 (L) 8.9 - 10.3 mg/dL   Total Protein 5.6 (L) 6.5 - 8.1 g/dL   Albumin 2.6 (L) 3.5 - 5.0 g/dL   AST 31 15 - 41 U/L   ALT 26 14 - 54 U/L   Alkaline Phosphatase 87 38 - 126 U/L   Total Bilirubin 0.3 0.3 - 1.2 mg/dL   GFR calc non Af Amer >60 >60 mL/min   GFR calc Af Amer >60 >60 mL/min   Anion gap  4 (L) 5 - 15  C-reactive protein     Status: None   Collection Time: 11/28/14  3:15 PM  Result Value Ref Range   CRP 0.9 <1.0 mg/dL  Sedimentation rate  Status: Abnormal   Collection Time: 11/28/14  3:15 PM  Result Value Ref Range   Sed Rate 30 (H) 0 - 22 mm/hr     MAU Course  Procedures - None  MDM: UA with urates/phosphates and trichomonas UDS positive for cocaine Speculum exam with frothy discharge Wet prep - with few clue cells and moderate bacteria  CBC with no leukocytosis CMP with some hypocalcemia and low albumin/protein otherwise unremarakable CRP nml /ESR 30(H) S/p Flagyl LE venous duplex - negative for DVT  Tried contact CSW after hours to help with homelessness and inability to afford medications. No one was able to be contacted.   Assessment and Plan  A: Patient is 39 y.o. [id number] [redacted]w[redacted]d reporting vaginal irritation and lower extremity swelling. Vaginal irritation likely secondary to multiple infections with trich and BV. Leg swelling with erythema, tenderness and warmth consistent with cellulitis infection.   P: Discharge home - Area on legs have been marked and dated - Rx for clindamycin x7days and Percocet for pain - Reviewed findings and my conclusion - fetal kick counts reinforced - preterm labor precautions dicussed - Handout given - Follow-up with OB provider   Luiz Blare, DO 11/28/2014, 8:14 PM PGY-1, [place]  OB fellow attestation:  I have seen and examined this patient; I agree with above documentation in the resident's note.   NIKKIE LIMING is a 39 y.o. [phone] reporting vaginal irritation x1 week and dysuria.    On exam legs erythematous/swollen and painful to even light touch, pt endorsed symptoms x 1 month however seen in clinic within past week and only noted to have trace edema.  +FM, denies LOF, VB, contractions  PE: BP 137/79 mmHg  Pulse 74  Temp(Src) 97.5 F (36.4 C) (Oral)  Resp 16  LMP  04/16/2014 Gen: calm comfortable, NAD Resp: normal effort, no distress Abd: gravid LE: bilateral lower extremity 2+ taut edema, exquisitely tender to touch, erythematous up to mid-leg.  Symmetrical edema/erythema.  ROS, labs, PMH reviewed NST reactive  Plan: trichomonas, bilateral lower extremity cellulitis - fetal kick counts reinforced, preterm labor precautions - vaginal discharge: trich on UA, G/C and wet prep done, 2g flagyl PO in MAU - LE swelling: labs ordered, no leukocytosis, normal CRP, elevated ESR.  LE dopplers ordered to r/o DVT and normal. ==> rx clindamycin and percocets for suspected cellulitis.  Patient does have history of drug abuse however clearly in severe pain. - continue routine follow up in OB clinic  Merla Riches, MD 1:04 PM

## 2014-11-28 NOTE — Progress Notes (Signed)
Ordered cheeseburger all the way except onions, chef salad with ranch dressing, milk, strawberry ice cream.

## 2014-11-28 NOTE — Progress Notes (Signed)
Called [facility] was told to call [facility] at [phone] spoke to [place] and will send someone to perform dopplers to MAU.

## 2014-11-28 NOTE — MAU Note (Signed)
Onset of bilateral lower extremity swelling for 3 months and pelvic pain today with vaginal irritation with pain with urination.

## 2014-11-28 NOTE — Progress Notes (Signed)
Patient states she is homeless, staying in abandoned house, has only oral sex, used crack cocaine yesterday, was here today for ultrasound.

## 2014-11-28 NOTE — Progress Notes (Signed)
VASCULAR LAB PRELIMINARY  PRELIMINARY  PRELIMINARY  PRELIMINARY  Bilateral lower extremity venous duplex  completed.    Preliminary report:  Bilateral:  No evidence of DVT, superficial thrombosis, or Baker's Cyst.  Technically limited by patient cooperation.    Mosi Hannold, RVT 11/28/2014, 7:24 PM

## 2014-11-28 NOTE — Progress Notes (Signed)
[place] tech at bedside to perform dopplers.

## 2014-11-28 NOTE — Discharge Instructions (Signed)

## 2014-11-28 NOTE — Progress Notes (Signed)
Attempted to contact SW to assist with placement in a shelter did not receive return call.

## 2014-11-29 LAB — GC/CHLAMYDIA PROBE AMP (~~LOC~~) NOT AT ARMC
CHLAMYDIA, DNA PROBE: NEGATIVE
NEISSERIA GONORRHEA: NEGATIVE

## 2014-12-07 ENCOUNTER — Ambulatory Visit (INDEPENDENT_AMBULATORY_CARE_PROVIDER_SITE_OTHER): Payer: Medicaid Other | Admitting: Family Medicine

## 2014-12-07 VITALS — BP 132/73 | HR 78 | Wt 144.9 lb

## 2014-12-07 DIAGNOSIS — Z3483 Encounter for supervision of other normal pregnancy, third trimester: Secondary | ICD-10-CM

## 2014-12-07 DIAGNOSIS — Z98891 History of uterine scar from previous surgery: Secondary | ICD-10-CM

## 2014-12-07 DIAGNOSIS — O3421 Maternal care for scar from previous cesarean delivery: Secondary | ICD-10-CM

## 2014-12-07 LAB — POCT URINALYSIS DIP (DEVICE)
BILIRUBIN URINE: NEGATIVE
GLUCOSE, UA: NEGATIVE mg/dL
KETONES UR: NEGATIVE mg/dL
NITRITE: NEGATIVE
PROTEIN: NEGATIVE mg/dL
Specific Gravity, Urine: 1.02 (ref 1.005–1.030)
Urobilinogen, UA: 0.2 mg/dL (ref 0.0–1.0)
pH: 7 (ref 5.0–8.0)

## 2014-12-07 NOTE — Progress Notes (Signed)
Subjective:  Lindsey [name] is a 39 y.o. [phone] at 8256w4d being seen today for ongoing prenatal care.  Patient reports no complaints.  Contractions: Not present.  Vag. Bleeding: None. Movement: Present. Denies leaking of fluid.   The following portions of the patient's history were reviewed and updated as appropriate: allergies, current medications, past family history, past medical history, past social history, past surgical history and problem list.   Objective:   Filed Vitals:   12/07/14 1032  BP: 132/73  Pulse: 78  Weight: 144 lb 14.4 oz (65.726 kg)    Fetal Status: Fetal Heart Rate (bpm): 138   Movement: Present     General:  Alert, oriented and cooperative. Patient is in no acute distress.  Skin: Skin is warm and dry. No rash noted.   Cardiovascular: Normal heart rate noted  Respiratory: Normal respiratory effort, no problems with respiration noted  Abdomen: Soft, gravid, appropriate for gestational age. Pain/Pressure: Present     Vaginal: Vag. Bleeding: None.       Cervix: Not evaluated        Extremities: Normal range of motion.  Edema: Trace  Mental Status: Normal mood and affect. Normal behavior. Normal judgment and thought content.   Urinalysis: Urine Protein: Negative Urine Glucose: Negative  Assessment and Plan:  Pregnancy: [id number] at 8256w4d  1. Encounter for supervision of other normal pregnancy in third trimester FHR normal.  Fundal height normal  2. History of cesarean delivery Would like TOLAC.  Discussed risks.  Consent form signed.   Preterm labor symptoms and general obstetric precautions including but not limited to vaginal bleeding, contractions, leaking of fluid and fetal movement were reviewed in detail with the patient.  Please refer to After Visit Summary for other counseling recommendations.   No Follow-up on file.   Levie HeritageJacob J Rea Reser, DO

## 2014-12-07 NOTE — Patient Instructions (Signed)
Third Trimester of Pregnancy The third trimester is from week 29 through week 42, months 7 through 9. The third trimester is a time when the fetus is growing rapidly. At the end of the ninth month, the fetus is about 20 inches in length and weighs 6-10 pounds.  BODY CHANGES Your body goes through many changes during pregnancy. The changes vary from woman to woman.   Your weight will continue to increase. You can expect to gain 25-35 pounds (11-16 kg) by the end of the pregnancy.  You may begin to get stretch marks on your hips, abdomen, and breasts.  You may urinate more often because the fetus is moving lower into your pelvis and pressing on your bladder.  You may develop or continue to have heartburn as a result of your pregnancy.  You may develop constipation because certain hormones are causing the muscles that push waste through your intestines to slow down.  You may develop hemorrhoids or swollen, bulging veins (varicose veins).  You may have pelvic pain because of the weight gain and pregnancy hormones relaxing your joints between the bones in your pelvis. Backaches may result from overexertion of the muscles supporting your posture.  You may have changes in your hair. These can include thickening of your hair, rapid growth, and changes in texture. Some women also have hair loss during or after pregnancy, or hair that feels dry or thin. Your hair will most likely return to normal after your baby is born.  Your breasts will continue to grow and be tender. A yellow discharge may leak from your breasts called colostrum.  Your belly button may stick out.  You may feel short of breath because of your expanding uterus.  You may notice the fetus "dropping," or moving lower in your abdomen.  You may have a bloody mucus discharge. This usually occurs a few days to a week before labor begins.  Your cervix becomes thin and soft (effaced) near your due date. WHAT TO EXPECT AT YOUR PRENATAL  EXAMS  You will have prenatal exams every 2 weeks until week 36. Then, you will have weekly prenatal exams. During a routine prenatal visit:  You will be weighed to make sure you and the fetus are growing normally.  Your blood pressure is taken.  Your abdomen will be measured to track your baby's growth.  The fetal heartbeat will be listened to.  Any test results from the previous visit will be discussed.  You may have a cervical check near your due date to see if you have effaced. At around 36 weeks, your caregiver will check your cervix. At the same time, your caregiver will also perform a test on the secretions of the vaginal tissue. This test is to determine if a type of bacteria, Group B streptococcus, is present. Your caregiver will explain this further. Your caregiver may ask you:  What your birth plan is.  How you are feeling.  If you are feeling the baby move.  If you have had any abnormal symptoms, such as leaking fluid, bleeding, severe headaches, or abdominal cramping.  If you have any questions. Other tests or screenings that may be performed during your third trimester include:  Blood tests that check for low iron levels (anemia).  Fetal testing to check the health, activity level, and growth of the fetus. Testing is done if you have certain medical conditions or if there are problems during the pregnancy. FALSE LABOR You may feel small, irregular contractions that   eventually go away. These are called Braxton Hicks contractions, or false labor. Contractions may last for hours, days, or even weeks before true labor sets in. If contractions come at regular intervals, intensify, or become painful, it is best to be seen by your caregiver.  SIGNS OF LABOR   Menstrual-like cramps.  Contractions that are 5 minutes apart or less.  Contractions that start on the top of the uterus and spread down to the lower abdomen and back.  A sense of increased pelvic pressure or back  pain.  A watery or bloody mucus discharge that comes from the vagina. If you have any of these signs before the 37th week of pregnancy, call your caregiver right away. You need to go to the hospital to get checked immediately. HOME CARE INSTRUCTIONS   Avoid all smoking, herbs, alcohol, and unprescribed drugs. These chemicals affect the formation and growth of the baby.  Follow your caregiver's instructions regarding medicine use. There are medicines that are either safe or unsafe to take during pregnancy.  Exercise only as directed by your caregiver. Experiencing uterine cramps is a good sign to stop exercising.  Continue to eat regular, healthy meals.  Wear a good support bra for breast tenderness.  Do not use hot tubs, steam rooms, or saunas.  Wear your seat belt at all times when driving.  Avoid raw meat, uncooked cheese, cat litter boxes, and soil used by cats. These carry germs that can cause birth defects in the baby.  Take your prenatal vitamins.  Try taking a stool softener (if your caregiver approves) if you develop constipation. Eat more high-fiber foods, such as fresh vegetables or fruit and whole grains. Drink plenty of fluids to keep your urine clear or pale yellow.  Take warm sitz baths to soothe any pain or discomfort caused by hemorrhoids. Use hemorrhoid cream if your caregiver approves.  If you develop varicose veins, wear support hose. Elevate your feet for 15 minutes, 3-4 times a day. Limit salt in your diet.  Avoid heavy lifting, wear low heal shoes, and practice good posture.  Rest a lot with your legs elevated if you have leg cramps or low back pain.  Visit your dentist if you have not gone during your pregnancy. Use a soft toothbrush to brush your teeth and be gentle when you floss.  A sexual relationship may be continued unless your caregiver directs you otherwise.  Do not travel far distances unless it is absolutely necessary and only with the approval  of your caregiver.  Take prenatal classes to understand, practice, and ask questions about the labor and delivery.  Make a trial run to the hospital.  Pack your hospital bag.  Prepare the baby's nursery.  Continue to go to all your prenatal visits as directed by your caregiver. SEEK MEDICAL CARE IF:  You are unsure if you are in labor or if your water has broken.  You have dizziness.  You have mild pelvic cramps, pelvic pressure, or nagging pain in your abdominal area.  You have persistent nausea, vomiting, or diarrhea.  You have a bad smelling vaginal discharge.  You have pain with urination. SEEK IMMEDIATE MEDICAL CARE IF:   You have a fever.  You are leaking fluid from your vagina.  You have spotting or bleeding from your vagina.  You have severe abdominal cramping or pain.  You have rapid weight loss or gain.  You have shortness of breath with chest pain.  You notice sudden or extreme swelling   of your face, hands, ankles, feet, or legs.  You have not felt your baby move in over an hour.  You have severe headaches that do not go away with medicine.  You have vision changes. Document Released: 05/20/2001 Document Revised: 05/31/2013 Document Reviewed: 07/27/2012 Exit[employer] Patient Information 2015 [employer]. This information is not intended to replace advice given to you by your health care provider. Make sure you discuss any questions you have with your health care provider.  

## 2014-12-12 ENCOUNTER — Encounter: Payer: Self-pay | Admitting: *Deleted

## 2014-12-21 ENCOUNTER — Encounter: Payer: Medicaid Other | Admitting: Family Medicine

## 2014-12-22 ENCOUNTER — Encounter (HOSPITAL_COMMUNITY): Payer: Self-pay

## 2014-12-22 ENCOUNTER — Telehealth (HOSPITAL_COMMUNITY): Payer: Self-pay | Admitting: *Deleted

## 2014-12-22 ENCOUNTER — Inpatient Hospital Stay (HOSPITAL_COMMUNITY)
Admission: AD | Admit: 2014-12-22 | Discharge: 2014-12-22 | Disposition: A | Discharge status: Home / Self Care | Payer: Medicaid Other | Source: Ambulatory Visit | Attending: Family Medicine | Admitting: Family Medicine

## 2014-12-22 DIAGNOSIS — F1490 Cocaine use, unspecified, uncomplicated: Secondary | ICD-10-CM | POA: Diagnosis not present

## 2014-12-22 DIAGNOSIS — O99323 Drug use complicating pregnancy, third trimester: Secondary | ICD-10-CM | POA: Insufficient documentation

## 2014-12-22 DIAGNOSIS — M79606 Pain in leg, unspecified: Secondary | ICD-10-CM | POA: Diagnosis present

## 2014-12-22 DIAGNOSIS — Z3A36 36 weeks gestation of pregnancy: Secondary | ICD-10-CM

## 2014-12-22 DIAGNOSIS — Z3A35 35 weeks gestation of pregnancy: Secondary | ICD-10-CM | POA: Insufficient documentation

## 2014-12-22 DIAGNOSIS — O99333 Smoking (tobacco) complicating pregnancy, third trimester: Secondary | ICD-10-CM | POA: Diagnosis not present

## 2014-12-22 DIAGNOSIS — F14180 Cocaine abuse with cocaine-induced anxiety disorder: Secondary | ICD-10-CM

## 2014-12-22 DIAGNOSIS — H9209 Otalgia, unspecified ear: Secondary | ICD-10-CM | POA: Diagnosis not present

## 2014-12-22 DIAGNOSIS — O99343 Other mental disorders complicating pregnancy, third trimester: Secondary | ICD-10-CM

## 2014-12-22 DIAGNOSIS — F17210 Nicotine dependence, cigarettes, uncomplicated: Secondary | ICD-10-CM | POA: Insufficient documentation

## 2014-12-22 DIAGNOSIS — M79603 Pain in arm, unspecified: Secondary | ICD-10-CM | POA: Diagnosis not present

## 2014-12-22 DIAGNOSIS — F1410 Cocaine abuse, uncomplicated: Secondary | ICD-10-CM | POA: Diagnosis present

## 2014-12-22 LAB — RAPID URINE DRUG SCREEN, HOSP PERFORMED
Amphetamines: NOT DETECTED
Barbiturates: NOT DETECTED
Benzodiazepines: NOT DETECTED
Cocaine: POSITIVE — AB
Opiates: NOT DETECTED
TETRAHYDROCANNABINOL: NOT DETECTED

## 2014-12-22 MED ORDER — ACETAMINOPHEN 500 MG PO TABS
1000.0000 mg | ORAL_TABLET | Freq: Once | ORAL | Status: AC
  Administered 2014-12-22: 1000 mg via ORAL
  Filled 2014-12-22: qty 2

## 2014-12-22 NOTE — Progress Notes (Signed)
Pt apologized to me stating she will take a taxi but she just wants to know what's wrong with her leg. Pt wants to use bedpan, she insisted on not being helped in bed onto bedpan but she stood on right leg, left her left leg on the bed and stood up using bedpan.  Voided sm amt dark orange strong smelling urine.  Back in bed.  PO fluids water and apple juice given, she finished her sandwich and ginger ales x 2.

## 2014-12-22 NOTE — Progress Notes (Signed)
Philipp DeputyKim Shaw CNM in to see pt and explained she is discharged and we can give her a taxi voucher.  Pt states phone numbers were in her phones that are missing and she doesn't have a number for her Aunt.  Pt upset, agitated, and threw her sandwich forward onto bed.  She said again that she can't walk.

## 2014-12-22 NOTE — Progress Notes (Signed)
Dr. Earlene PlaterWallace notified of efm tracing reactive, ok to take off monitors.

## 2014-12-22 NOTE — Progress Notes (Signed)
[facility] Security Barbara CowerJason to come down write report regarding pt states her phones are missing. Offered taxi voucher or bus pass but pt upset and says she can not walk.  Pt's Aunt called hospital and pt spoke to her on hospital phone.

## 2014-12-22 NOTE — MAU Provider Note (Signed)
History     CSN: [id number]  Arrival date and time: 12/22/14 1333   None     Chief Complaint  Patient presents with  . Leg Pain   HPI Patient is 39 y.o. [id number] [redacted]w[redacted]d here with complaints of "hurting all over, especially her ear" Patient has a history of substance abuse and reports she recently used cocaine. Interview is difficult because patient will wake and moans but then falls asleep. She will not answer questions but does follow directions. She is alert.   +FM, denies LOF, VB, contractions, vaginal discharge. These has to be asked in simple yes/no fashion in order for patient to respond.    Past Medical History  Diagnosis Date  . H/O drug abuse     Admits to crack cocaine use yesterday 12/21/2014  . Trichomonas   . GERD (gastroesophageal reflux disease)   . Hypertension   . Pregnancy induced hypertension     Past Surgical History  Procedure Laterality Date  . Cesarean section  03/19/2012    Procedure: CESAREAN SECTION;  Surgeon: Antionette Char, MD;  Location: [facility];  Service: Obstetrics;  Laterality: N/A;    Family History  Problem Relation Age of Onset  . Anesthesia problems Neg Hx   . Hypotension Neg Hx   . Malignant hyperthermia Neg Hx   . Pseudochol deficiency Neg Hx   . Cancer Mother   . Diabetes Father   . Cancer Maternal Grandmother     History  Substance Use Topics  . Smoking status: Current Some Day Smoker -- 0.01 packs/day    Types: Cigarettes    Last Attempt to Quit: 05/28/2014  . Smokeless tobacco: Never Used  . Alcohol Use: No   History   Social History Narrative   Patient is currently homeless and does not have stable housing. She is living in a house alone currently. FOB is not involved. She has two other children.     Allergies: No Known Allergies  Prescriptions prior to admission  Medication Sig Dispense Refill Last Dose  . flintstones complete (FLINTSTONES) 60 MG chewable tablet Chew 2 tablets by mouth daily.   Taking  .  oxyCODONE-acetaminophen (ROXICET) 5-325 MG per tablet Take 1 tablet by mouth every 6 (six) hours as needed for severe pain. (Patient not taking: Reported on 12/07/2014) 20 tablet 0 Not Taking    Review of Systems  Unable to perform ROS: other  Patient intermittently falls asleep and appears to be high   Physical Exam   Blood pressure 144/77, pulse 75, temperature 97.5 F (36.4 C), temperature source Oral, resp. rate 16, last menstrual period 04/16/2014.  Physical Exam  Constitutional: She appears well-developed and well-nourished. No distress.  Pregnant female. Lying in bed sleeping. Easily wakes with use of loud voice and answers yes/no questions. When she wakes she moans and will only answer simple questions then falls back to sleep.   HENT:  Head: Normocephalic and atraumatic.  Right Ear: External ear normal.  Left Ear: External ear normal.  Mild erythema in bilateral EACs without buldging TM or signs of overt infection. Patient turns head for exam and follows directions.   Eyes: Conjunctivae are normal. No scleral icterus.  Neck: Normal range of motion. Neck supple.  Cardiovascular: Normal rate and intact distal pulses.   Respiratory: Effort normal.  GI: Soft. She exhibits no distension. There is no tenderness. There is no rebound and no guarding.  Gravid  Genitourinary: Vagina normal.  Musculoskeletal: Normal range of motion. She exhibits no  edema.  Neurological: She is alert.  Skin: Skin is warm and dry. No rash noted.  Psychiatric: She has a normal mood and affect.   Cervix was checked by CNM when she presented and she was close/th/high.   MAU Course  Procedures  MDM UA UDS- POSITIVE for cocaine  Assessment and Plan   39 y.o. [id number] 3824w5d here with complaints of "hurting all over, especially her ear"   #Suspected Cocaine intoxication - UDS Positive for cocaine and patient admits to using today for her "pain" - patient in currently too sleepy for discharge, monitor  and reassess  Federico FlakeKimberly Niles Rolland Steinert 12/22/2014, 2:28 PM   -------------------------------------------------------------- 9:43 PM Patient was assessed by CNM and was complaining of not being able to walk. I returned to assess her now that she is more awake. Patient is conversant but tangential and non-linear. She was able to provide me with more thorough though disjointed history. She reports "pain all over" and pain in her left ear and left forearm. She is currently make eye contact.   Repeat exam:  Ear; TM clear bilaterally.  CV: RRR, 2+ radial and plantar pulses bilaterally.  Neuro: CN2-12 intact in detail. 2+ reflexes tested at achilles and biceps tendon. Move all extremities when distracted. 5/5 strength tested at bilaterally at grip, biceps, knee flexion/ext, dorsi and plantar flexion. Patient was able to demonstrate bilateral knee movement.  Skin: area of erythema on the left upper extremity. No induration of abscess.   Assessment:  #"All over pain" likely MSK pain in the setting of very reassuring neurological assessment.  -Will give tylenol 1000mg   #Arm pain: these is an area of erythema but no abscess or other severe bacterial infection. Suspect insect bite.   #Walking discomfort: complete neuro exam and strength testing is reassuring.   #Ear pain: no evidence of infection. Discussed this with patient and that I would recommend tylenol.  #Substance use: counseled about risk of adverse pregnancy outcomes with cocaine abuse and discussed life threatening nature of acute intoxication. I specifically mentioned abruption to which patient responded "I have used crack during my last two pregnancies and my babies are perfect and smart." Recommend involving SW postpartum given active use in pregnancy.   #Routine care: Confirmed desire for BTL.   Federico FlakeKimberly Niles Carnella Fryman, MD Family Medicine, OB Fellow [facility]

## 2014-12-22 NOTE — Discharge Instructions (Signed)
Stimulant Use Disorder-Cocaine °Cocaine is one of a group of powerful drugs called stimulants. Cocaine has medical uses for stopping nosebleeds and for pain control before minor nose or dental surgery. However, cocaine is misused because of the effects that it produces. These effects include:  °· A feeling of extreme pleasure. °· Alertness. °· High energy. °Common street names for cocaine include coke, crack, blow, snow, and nose candy. Cocaine is snorted, dissolved in water and injected, or smoked.  °Stimulants are addictive because they activate regions of the brain that produce both the pleasurable sensation of "reward" and psychological dependence. Together, these actions account for loss of control and the rapid development of drug dependence. This means you become ill without the drug (withdrawal) and need to keep using it to function.  °Stimulant use disorder is use of stimulants that disrupts your daily life. It disrupts relationships with family and friends and how you do your job. Cocaine increases your blood pressure and heart rate. It can cause a heart attack or stroke. Cocaine can also cause death from irregular heart rate or seizures. °SYMPTOMS °Symptoms of stimulant use disorder with cocaine include: °· Use of cocaine in larger amounts or over a longer period of time than intended. °· Unsuccessful attempts to cut down or control cocaine use. °· A lot of time spent obtaining, using, or recovering from the effects of cocaine. °· A strong desire or urge to use cocaine (craving). °· Continued use of cocaine in spite of major problems at work, school, or home because of use. °· Continued use of cocaine in spite of relationship problems because of use. °· Giving up or cutting down on important life activities because of cocaine use. °· Use of cocaine over and over in situations when it is physically hazardous, such as driving a car. °· Continued use of cocaine in spite of a physical problem that is likely  related to use. Physical problems can include: °¨ Malnutrition. °¨ Nosebleeds. °¨ Chest pain. °¨ High blood pressure. °¨ A hole that develops between the part of your nose that separates your nostrils (perforated nasal septum). °¨ Lung and kidney damage. °· Continued use of cocaine in spite of a mental problem that is likely related to use. Mental problems can include: °¨ Schizophrenia-like symptoms. °¨ Depression. °¨ Bipolar mood swings. °¨ Anxiety. °¨ Sleep problems. °· Need to use more and more cocaine to get the same effect, or lessened effect over time with use of the same amount of cocaine (tolerance). °· Having withdrawal symptoms when cocaine use is stopped, or using cocaine to reduce or avoid withdrawal symptoms. Withdrawal symptoms include: °¨ Depressed or irritable mood. °¨ Low energy or restlessness. °¨ Bad dreams. °¨ Poor or excessive sleep. °¨ Increased appetite. °DIAGNOSIS °Stimulant use disorder is diagnosed by your health care provider. You may be asked questions about your cocaine use and how it affects your life. A physical exam may be done. A drug screen may be ordered. You may be referred to a mental health professional. The diagnosis of stimulant use disorder requires at least two symptoms within 12 months. The type of stimulant use disorder depends on the number of signs and symptoms you have. The type may be: °· Mild. Two or three signs and symptoms. °· Moderate. Four or five signs and symptoms. °· Severe. Six or more signs and symptoms. °TREATMENT °Treatment for stimulant use disorder is usually provided by mental health professionals with training in substance use disorders. The following options are available: °·   Counseling or talk therapy. Talk therapy addresses the reasons you use cocaine and ways to keep you from using again. Goals of talk therapy include: °¨ Identifying and avoiding triggers for use. °¨ Handling cravings. °¨ Replacing use with healthy activities. °· Support groups.  Support groups provide emotional support, advice, and guidance. °· Medicine. Certain medicines may decrease cocaine cravings or withdrawal symptoms. °HOME CARE INSTRUCTIONS °· Take medicines only as directed by your health care provider. °· Identify the people and activities that trigger your cocaine use and avoid them. °· Keep all follow-up visits as directed by your health care provider. °SEEK MEDICAL CARE IF: °· Your symptoms get worse or you relapse. °· You are not able to take medicines as directed. °SEEK IMMEDIATE MEDICAL CARE IF: °· You have serious thoughts about hurting yourself or others. °· You have a seizure, chest pain, sudden weakness, or loss of speech or vision. °FOR MORE INFORMATION °· National Institute on Drug Abuse: www.drugabuse.gov °· Substance Abuse and Mental Health Services Administration: www.samhsa.gov °Document Released: 05/23/2000 Document Revised: 10/10/2013 Document Reviewed: 06/08/2013 °ExitCare® Patient Information ©2015 [employer]. This information is not intended to replace advice given to you by your health care provider. Make sure you discuss any questions you have with your health care provider. ° °

## 2014-12-22 NOTE — Progress Notes (Signed)
Dr. Newton in to see pt.

## 2014-12-22 NOTE — MAU Note (Signed)
Pt states here for leg pains bilaterally, as well as left arm only. All s/s began last pm. Legs and arm painful to touch.

## 2014-12-22 NOTE — Progress Notes (Signed)
Left voice mail for [facility] [phone], Dorris Singherry Kraft and Mikal PlaneLynette Gaines due to Pt pregnant, verbalized history of drug use and being homeless.

## 2014-12-22 NOTE — MAU Note (Signed)
Acknowledges crack cocaine use yesterday. Pt now asleep.

## 2014-12-22 NOTE — Progress Notes (Signed)
Pt awake, sandwich tray given.  Pt said 'they are going to have to give me something better than this', requested mayo and mustard.  Attempted to check blood pressure prior to eating, pt pulled away.  Pt asking about her 2 phones, states she came in 'alone'.  Mentioned the person named 'Lindsey [name]' came in with her and she again said she came in alone. States she 'can't walk' and her 'leg is numb'.  Left room to get her mayo and mustard.

## 2014-12-22 NOTE — MAU Note (Signed)
Pt attempted to give RN Aunt's telephone number. Unable to reach aunt. Lindsey [name] (pt's emergency contact) called. Msg left to call MAU.

## 2014-12-22 NOTE — Progress Notes (Signed)
Security Barbara CowerJason took report.  Pt said she wanted to speak to doctor regarding her leg, reports left leg is numb and can't walk.  Philipp DeputyKim Shaw CNM in to see pt.

## 2014-12-22 NOTE — MAU Note (Signed)
Urine in lab 

## 2014-12-22 NOTE — Progress Notes (Signed)
Philipp DeputyKim Shaw CNM said she will call fellow to evaluate pt.

## 2014-12-25 ENCOUNTER — Encounter: Payer: Self-pay | Admitting: Obstetrics and Gynecology

## 2014-12-25 ENCOUNTER — Ambulatory Visit (INDEPENDENT_AMBULATORY_CARE_PROVIDER_SITE_OTHER): Payer: Medicaid Other | Admitting: Obstetrics and Gynecology

## 2014-12-25 VITALS — BP 116/78 | HR 78 | Wt 140.2 lb

## 2014-12-25 DIAGNOSIS — F419 Anxiety disorder, unspecified: Secondary | ICD-10-CM

## 2014-12-25 DIAGNOSIS — O09523 Supervision of elderly multigravida, third trimester: Secondary | ICD-10-CM

## 2014-12-25 DIAGNOSIS — F1410 Cocaine abuse, uncomplicated: Secondary | ICD-10-CM

## 2014-12-25 DIAGNOSIS — Z98891 History of uterine scar from previous surgery: Secondary | ICD-10-CM

## 2014-12-25 DIAGNOSIS — O99323 Drug use complicating pregnancy, third trimester: Secondary | ICD-10-CM

## 2014-12-25 DIAGNOSIS — Z9889 Other specified postprocedural states: Secondary | ICD-10-CM

## 2014-12-25 DIAGNOSIS — Z3483 Encounter for supervision of other normal pregnancy, third trimester: Secondary | ICD-10-CM

## 2014-12-25 LAB — POCT URINALYSIS DIP (DEVICE)
BILIRUBIN URINE: NEGATIVE
Glucose, UA: NEGATIVE mg/dL
Ketones, ur: NEGATIVE mg/dL
Nitrite: NEGATIVE
Protein, ur: 30 mg/dL — AB
SPECIFIC GRAVITY, URINE: 1.025 (ref 1.005–1.030)
Urobilinogen, UA: 0.2 mg/dL (ref 0.0–1.0)
pH: 6.5 (ref 5.0–8.0)

## 2014-12-25 LAB — OB RESULTS CONSOLE GC/CHLAMYDIA: Gonorrhea: NEGATIVE

## 2014-12-25 LAB — OB RESULTS CONSOLE GBS: GBS: POSITIVE

## 2014-12-25 NOTE — Progress Notes (Signed)
Subjective:  Lindsey [name] is a 39 y.o. [phone] at 2439w1d being seen today for ongoing prenatal care.  Patient reports no complaints.  Contractions: Not present.  Vag. Bleeding: None. Movement: Present. Denies leaking of fluid.   The following portions of the patient's history were reviewed and updated as appropriate: allergies, current medications, past family history, past medical history, past social history, past surgical history and problem list.   Objective:   Filed Vitals:   12/25/14 1105  BP: 116/78  Pulse: 78  Weight: 140 lb 3.2 oz (63.594 kg)    Fetal Status:     Movement: Present     General:  Alert, oriented and cooperative. Patient is in no acute distress.  Skin: Skin is warm and dry. No rash noted.   Cardiovascular: Normal heart rate noted  Respiratory: Normal respiratory effort, no problems with respiration noted  Abdomen: Soft, gravid, appropriate for gestational age. Pain/Pressure: Present     Vaginal: Vag. Bleeding: None.       Cervix: patient declined exam        Extremities: Normal range of motion.  Edema: Trace  Mental Status: Normal mood and affect. Normal behavior. Normal judgment and thought content.   Urinalysis: Urine Protein: 1+ Urine Glucose: Negative  Assessment and Plan:  Pregnancy: [id number] at 1739w1d  1. Encounter for supervision of other normal pregnancy in third trimester Patient is doing well without complaints - GC/Chlamydia Probe Amp - Culture, beta strep (group b only)  2. History of cesarean delivery Desires TOLAC  3. Cocaine abuse affecting pregnancy in third trimester   4. AMA (advanced maternal age) multigravida 35+, third trimester   5. Anxiety   Term labor symptoms and general obstetric precautions including but not limited to vaginal bleeding, contractions, leaking of fluid and fetal movement were reviewed in detail with the patient. Please refer to After Visit Summary for other counseling recommendations.  Return in  about 1 week (around 01/01/2015).   Catalina AntiguaPeggy Osby Sweetin, MD

## 2014-12-26 LAB — GC/CHLAMYDIA PROBE AMP
CT PROBE, AMP APTIMA: NEGATIVE
GC Probe RNA: NEGATIVE

## 2014-12-26 NOTE — Progress Notes (Signed)
CSW received message from [employer]Care Management stating they received a message from MAU RN reporting homelessness and current drug use.  Will follow at delivery.  Please contact CSW if patient arrives to MAU prior to delivery.

## 2014-12-26 NOTE — Progress Notes (Signed)
Received phone message from MAU RN, Remigio EisenmengerBenji Stanley, concerning pt., message left Friday night around 2200.  Referral is not an appropriate consult for Case Management.  Information given to CSW for Clinics for appropriate follow up.  Kathi Dererri Jamaal Bernasconi RNC-MNN, BSN

## 2014-12-27 LAB — CULTURE, BETA STREP (GROUP B ONLY)

## 2014-12-30 ENCOUNTER — Inpatient Hospital Stay (HOSPITAL_COMMUNITY)
Admission: AD | Admit: 2014-12-30 | Discharge: 2015-01-02 | DRG: 767 | Disposition: A | Discharge status: Home / Self Care | Payer: Medicaid Other | Source: Ambulatory Visit | Attending: Obstetrics & Gynecology | Admitting: Obstetrics & Gynecology

## 2014-12-30 DIAGNOSIS — Z880 Allergy status to penicillin: Secondary | ICD-10-CM

## 2014-12-30 DIAGNOSIS — O99344 Other mental disorders complicating childbirth: Secondary | ICD-10-CM | POA: Diagnosis present

## 2014-12-30 DIAGNOSIS — O3421 Maternal care for scar from previous cesarean delivery: Secondary | ICD-10-CM | POA: Diagnosis present

## 2014-12-30 DIAGNOSIS — O9962 Diseases of the digestive system complicating childbirth: Secondary | ICD-10-CM | POA: Diagnosis present

## 2014-12-30 DIAGNOSIS — F1490 Cocaine use, unspecified, uncomplicated: Secondary | ICD-10-CM | POA: Diagnosis present

## 2014-12-30 DIAGNOSIS — Z6824 Body mass index (BMI) 24.0-24.9, adult: Secondary | ICD-10-CM

## 2014-12-30 DIAGNOSIS — IMO0001 Reserved for inherently not codable concepts without codable children: Secondary | ICD-10-CM

## 2014-12-30 DIAGNOSIS — O99334 Smoking (tobacco) complicating childbirth: Secondary | ICD-10-CM | POA: Diagnosis present

## 2014-12-30 DIAGNOSIS — O09523 Supervision of elderly multigravida, third trimester: Secondary | ICD-10-CM

## 2014-12-30 DIAGNOSIS — F1410 Cocaine abuse, uncomplicated: Secondary | ICD-10-CM | POA: Diagnosis present

## 2014-12-30 DIAGNOSIS — Z302 Encounter for sterilization: Secondary | ICD-10-CM

## 2014-12-30 DIAGNOSIS — K219 Gastro-esophageal reflux disease without esophagitis: Secondary | ICD-10-CM | POA: Diagnosis present

## 2014-12-30 DIAGNOSIS — O99824 Streptococcus B carrier state complicating childbirth: Secondary | ICD-10-CM | POA: Diagnosis present

## 2014-12-30 DIAGNOSIS — F418 Other specified anxiety disorders: Secondary | ICD-10-CM | POA: Diagnosis present

## 2014-12-30 DIAGNOSIS — R202 Paresthesia of skin: Secondary | ICD-10-CM | POA: Diagnosis not present

## 2014-12-30 DIAGNOSIS — O99324 Drug use complicating childbirth: Principal | ICD-10-CM | POA: Diagnosis present

## 2014-12-30 DIAGNOSIS — Z3A37 37 weeks gestation of pregnancy: Secondary | ICD-10-CM | POA: Diagnosis present

## 2014-12-30 DIAGNOSIS — Z833 Family history of diabetes mellitus: Secondary | ICD-10-CM

## 2014-12-31 ENCOUNTER — Encounter (HOSPITAL_COMMUNITY): Payer: Self-pay | Admitting: *Deleted

## 2014-12-31 DIAGNOSIS — O9962 Diseases of the digestive system complicating childbirth: Secondary | ICD-10-CM | POA: Diagnosis present

## 2014-12-31 DIAGNOSIS — Z833 Family history of diabetes mellitus: Secondary | ICD-10-CM | POA: Diagnosis not present

## 2014-12-31 DIAGNOSIS — IMO0001 Reserved for inherently not codable concepts without codable children: Secondary | ICD-10-CM

## 2014-12-31 DIAGNOSIS — Z880 Allergy status to penicillin: Secondary | ICD-10-CM | POA: Diagnosis not present

## 2014-12-31 DIAGNOSIS — Z3A37 37 weeks gestation of pregnancy: Secondary | ICD-10-CM | POA: Diagnosis present

## 2014-12-31 DIAGNOSIS — O3421 Maternal care for scar from previous cesarean delivery: Secondary | ICD-10-CM | POA: Diagnosis not present

## 2014-12-31 DIAGNOSIS — Z6824 Body mass index (BMI) 24.0-24.9, adult: Secondary | ICD-10-CM | POA: Diagnosis not present

## 2014-12-31 DIAGNOSIS — O133 Gestational [pregnancy-induced] hypertension without significant proteinuria, third trimester: Secondary | ICD-10-CM

## 2014-12-31 DIAGNOSIS — O9989 Other specified diseases and conditions complicating pregnancy, childbirth and the puerperium: Secondary | ICD-10-CM | POA: Diagnosis present

## 2014-12-31 DIAGNOSIS — Z302 Encounter for sterilization: Secondary | ICD-10-CM | POA: Diagnosis not present

## 2014-12-31 DIAGNOSIS — O99334 Smoking (tobacco) complicating childbirth: Secondary | ICD-10-CM | POA: Diagnosis present

## 2014-12-31 DIAGNOSIS — O99324 Drug use complicating childbirth: Secondary | ICD-10-CM | POA: Diagnosis not present

## 2014-12-31 DIAGNOSIS — F418 Other specified anxiety disorders: Secondary | ICD-10-CM

## 2014-12-31 DIAGNOSIS — R202 Paresthesia of skin: Secondary | ICD-10-CM | POA: Diagnosis not present

## 2014-12-31 DIAGNOSIS — O09523 Supervision of elderly multigravida, third trimester: Secondary | ICD-10-CM

## 2014-12-31 DIAGNOSIS — O99824 Streptococcus B carrier state complicating childbirth: Secondary | ICD-10-CM | POA: Diagnosis present

## 2014-12-31 DIAGNOSIS — F1490 Cocaine use, unspecified, uncomplicated: Secondary | ICD-10-CM | POA: Diagnosis present

## 2014-12-31 DIAGNOSIS — F1410 Cocaine abuse, uncomplicated: Secondary | ICD-10-CM | POA: Diagnosis not present

## 2014-12-31 DIAGNOSIS — O99344 Other mental disorders complicating childbirth: Secondary | ICD-10-CM

## 2014-12-31 DIAGNOSIS — K219 Gastro-esophageal reflux disease without esophagitis: Secondary | ICD-10-CM | POA: Diagnosis present

## 2014-12-31 LAB — CBC
HEMATOCRIT: 34.9 % — AB (ref 36.0–46.0)
Hemoglobin: 11.9 g/dL — ABNORMAL LOW (ref 12.0–15.0)
MCH: 29.4 pg (ref 26.0–34.0)
MCHC: 34.1 g/dL (ref 30.0–36.0)
MCV: 86.2 fL (ref 78.0–100.0)
Platelets: 247 10*3/uL (ref 150–400)
RBC: 4.05 MIL/uL (ref 3.87–5.11)
RDW: 14.1 % (ref 11.5–15.5)
WBC: 10.1 10*3/uL (ref 4.0–10.5)

## 2014-12-31 LAB — COMPREHENSIVE METABOLIC PANEL
ALT: 21 U/L (ref 14–54)
AST: 46 U/L — ABNORMAL HIGH (ref 15–41)
Albumin: 2.9 g/dL — ABNORMAL LOW (ref 3.5–5.0)
Alkaline Phosphatase: 134 U/L — ABNORMAL HIGH (ref 38–126)
Anion gap: 4 — ABNORMAL LOW (ref 5–15)
BUN: 9 mg/dL (ref 6–20)
CALCIUM: 8.3 mg/dL — AB (ref 8.9–10.3)
CO2: 20 mmol/L — ABNORMAL LOW (ref 22–32)
CREATININE: 0.85 mg/dL (ref 0.44–1.00)
Chloride: 109 mmol/L (ref 101–111)
GFR calc Af Amer: >60 mL/min (ref >60)
Glucose, Bld: 99 mg/dL (ref 65–99)
Potassium: 3.4 mmol/L — ABNORMAL LOW (ref 3.5–5.1)
Sodium: 133 mmol/L — ABNORMAL LOW (ref 135–145)
Total Bilirubin: 0.8 mg/dL (ref 0.3–1.2)
Total Protein: 5.6 g/dL — ABNORMAL LOW (ref 6.5–8.1)

## 2014-12-31 LAB — TYPE AND SCREEN
ABO/RH(D): AB POS
Antibody Screen: NEGATIVE

## 2014-12-31 LAB — RPR: RPR Ser Ql: NONREACTIVE

## 2014-12-31 LAB — HIV ANTIBODY (ROUTINE TESTING W REFLEX): HIV Screen 4th Generation wRfx: NONREACTIVE

## 2014-12-31 MED ORDER — IBUPROFEN 600 MG PO TABS
600.0000 mg | ORAL_TABLET | Freq: Four times a day (QID) | ORAL | Status: DC
  Administered 2014-12-31 – 2015-01-02 (×7): 600 mg via ORAL
  Filled 2014-12-31 (×8): qty 1

## 2014-12-31 MED ORDER — HALOPERIDOL 2 MG PO TABS
2.0000 mg | ORAL_TABLET | Freq: Once | ORAL | Status: AC
  Administered 2014-12-31: 2 mg via ORAL
  Filled 2014-12-31: qty 1

## 2014-12-31 MED ORDER — LANOLIN HYDROUS EX OINT: 1.0000 "application " | TOPICAL_OINTMENT | CUTANEOUS | Status: DC | PRN

## 2014-12-31 MED ORDER — DIPHENHYDRAMINE HCL 25 MG PO CAPS: 25.0000 mg | ORAL_CAPSULE | Freq: Four times a day (QID) | ORAL | Status: DC | PRN

## 2014-12-31 MED ORDER — PRENATAL MULTIVITAMIN CH
1.0000 | ORAL_TABLET | Freq: Every day | ORAL | Status: DC
  Administered 2014-12-31: 1 via ORAL
  Filled 2014-12-31: qty 1

## 2014-12-31 MED ORDER — FENTANYL CITRATE (PF) 100 MCG/2ML IJ SOLN: 100.0000 ug | INTRAMUSCULAR | Status: DC | PRN

## 2014-12-31 MED ORDER — FLEET ENEMA 7-19 GM/118ML RE ENEM: 1.0000 | ENEMA | RECTAL | Status: DC | PRN

## 2014-12-31 MED ORDER — CITRIC ACID-SODIUM CITRATE 334-500 MG/5ML PO SOLN: 30.0000 mL | ORAL | Status: DC | PRN

## 2014-12-31 MED ORDER — LACTATED RINGERS IV SOLN: 500.0000 mL | INTRAVENOUS | Status: DC | PRN

## 2014-12-31 MED ORDER — SODIUM CHLORIDE 0.9 % IV SOLN
2.0000 g | Freq: Once | INTRAVENOUS | Status: DC
  Filled 2014-12-31: qty 2000

## 2014-12-31 MED ORDER — DIBUCAINE 1 % RE OINT: 1.0000 "application " | TOPICAL_OINTMENT | RECTAL | Status: DC | PRN

## 2014-12-31 MED ORDER — SIMETHICONE 80 MG PO CHEW
80.0000 mg | CHEWABLE_TABLET | ORAL | Status: DC | PRN
Start: 2014-12-31 — End: 2015-01-02

## 2014-12-31 MED ORDER — OXYTOCIN 10 UNIT/ML IJ SOLN
INTRAMUSCULAR | Status: AC
  Administered 2014-12-31: 10 [IU]
  Filled 2014-12-31: qty 1

## 2014-12-31 MED ORDER — OXYTOCIN 40 UNITS IN LACTATED RINGERS INFUSION - SIMPLE MED
62.5000 mL/h | INTRAVENOUS | Status: DC
Start: 2014-12-31 — End: 2014-12-31

## 2014-12-31 MED ORDER — MISOPROSTOL 200 MCG PO TABS
ORAL_TABLET | ORAL | Status: AC
  Filled 2014-12-31: qty 5

## 2014-12-31 MED ORDER — OXYCODONE-ACETAMINOPHEN 5-325 MG PO TABS: 1.0000 | ORAL_TABLET | ORAL | Status: DC | PRN

## 2014-12-31 MED ORDER — FERROUS SULFATE 325 (65 FE) MG PO TABS
325.0000 mg | ORAL_TABLET | Freq: Two times a day (BID) | ORAL | Status: DC
  Administered 2014-12-31 (×2): 325 mg via ORAL
  Filled 2014-12-31 (×2): qty 1

## 2014-12-31 MED ORDER — OXYCODONE-ACETAMINOPHEN 5-325 MG PO TABS: 2.0000 | ORAL_TABLET | ORAL | Status: DC | PRN

## 2014-12-31 MED ORDER — ACETAMINOPHEN 325 MG PO TABS: 650.0000 mg | ORAL_TABLET | ORAL | Status: DC | PRN

## 2014-12-31 MED ORDER — OXYTOCIN BOLUS FROM INFUSION: 500.0000 mL | INTRAVENOUS | Status: DC

## 2014-12-31 MED ORDER — OXYTOCIN 40 UNITS IN LACTATED RINGERS INFUSION - SIMPLE MED
INTRAVENOUS | Status: AC
  Filled 2014-12-31: qty 1000

## 2014-12-31 MED ORDER — MAGNESIUM HYDROXIDE 400 MG/5ML PO SUSP: 30.0000 mL | ORAL | Status: DC | PRN

## 2014-12-31 MED ORDER — LIDOCAINE HCL (PF) 1 % IJ SOLN
30.0000 mL | INTRAMUSCULAR | Status: DC | PRN
  Filled 2014-12-31: qty 30

## 2014-12-31 MED ORDER — ONDANSETRON HCL 4 MG/2ML IJ SOLN: 4.0000 mg | INTRAMUSCULAR | Status: DC | PRN

## 2014-12-31 MED ORDER — ZOLPIDEM TARTRATE 5 MG PO TABS: 5.0000 mg | ORAL_TABLET | Freq: Every evening | ORAL | Status: DC | PRN

## 2014-12-31 MED ORDER — OXYCODONE-ACETAMINOPHEN 5-325 MG PO TABS
1.0000 | ORAL_TABLET | ORAL | Status: DC | PRN
  Administered 2015-01-01 – 2015-01-02 (×3): 1 via ORAL
  Filled 2014-12-31 (×3): qty 1

## 2014-12-31 MED ORDER — FENTANYL CITRATE (PF) 100 MCG/2ML IJ SOLN
INTRAMUSCULAR | Status: AC
  Filled 2014-12-31: qty 2

## 2014-12-31 MED ORDER — ONDANSETRON HCL 4 MG/2ML IJ SOLN: 4.0000 mg | Freq: Four times a day (QID) | INTRAMUSCULAR | Status: DC | PRN

## 2014-12-31 MED ORDER — LIDOCAINE HCL (PF) 1 % IJ SOLN
INTRAMUSCULAR | Status: AC
  Filled 2014-12-31: qty 30

## 2014-12-31 MED ORDER — ONDANSETRON HCL 4 MG PO TABS: 4.0000 mg | ORAL_TABLET | ORAL | Status: DC | PRN

## 2014-12-31 MED ORDER — MEASLES, MUMPS & RUBELLA VAC ~~LOC~~ INJ
0.5000 mL | INJECTION | Freq: Once | SUBCUTANEOUS | Status: DC
  Filled 2014-12-31: qty 0.5

## 2014-12-31 MED ORDER — BENZOCAINE-MENTHOL 20-0.5 % EX AERO: 1.0000 "application " | INHALATION_SPRAY | CUTANEOUS | Status: DC | PRN

## 2014-12-31 MED ORDER — WITCH HAZEL-GLYCERIN EX PADS: 1.0000 "application " | MEDICATED_PAD | CUTANEOUS | Status: DC | PRN

## 2014-12-31 MED ORDER — TETANUS-DIPHTH-ACELL PERTUSSIS 5-2.5-18.5 LF-MCG/0.5 IM SUSP: 0.5000 mL | Freq: Once | INTRAMUSCULAR | Status: DC

## 2014-12-31 MED ORDER — LACTATED RINGERS IV SOLN: INTRAVENOUS | Status: DC

## 2014-12-31 MED ORDER — SENNOSIDES-DOCUSATE SODIUM 8.6-50 MG PO TABS
2.0000 | ORAL_TABLET | ORAL | Status: DC
  Administered 2015-01-01 (×2): 2 via ORAL
  Filled 2014-12-31 (×2): qty 2

## 2014-12-31 NOTE — Progress Notes (Signed)
Acknowledged order for social work consult to assess mother's hx of substance abuse.  Mother's UDS was positive for cocaine on admission.  Attempted visit with mother.  She agreed to meet with CSW, but she had her eyes closed, would mumble responses to questions, and became agitated when asked to repeat responses.  Met with her sister Lindsey [name] while she was visiting with newborn in NICU.  Mother listed sister as her support person.  MOB's sister states that MOB has been struggling with drug addiction since 44 and participated in numerous treatment programs, but she would always eventually relapse.  Sister notes that MOB is currently abusing crack cocaine.   She believes that her sister is also currently homeless.   Sister states that she is not aware of patient having any hx of mental illness.  Informed that patient has two other children Lindsey [name] born 01/17/2000 and Lindsey [name] born 03/19/2012.  Informed that FOB of the 40 year old took her to [place], and the family returned the child to her 2 years ago after the father died.  She has reportedly had custody of the 40 year old since birth.  Aunt states that she plans to take custody of newborn, and has already prepared her home and purchased needed baby items.  Contact information for aunt is Lindsey [name], [place] [address] cell: [phone].  Aunt informed of CSW availability.    Case was referred to DSS.  Spoke with Lindsey [name].  Discharge disposition for newborn pending DSS investigation.

## 2014-12-31 NOTE — Progress Notes (Signed)
Received pt from EMS.  Pt thrashing around on the stretcher, verbalizing short sentences, 'help me jesus, call my sister'. C/O vaginal pain.  Pt let me briefly listen to baby by fhm twice for just seconds. Unable to ask history questions or check vitals. Does report she goes to clinic downstairs.  History of cocaine abuse per EMS.  Emotional support provided and ongoing support to keep pt safe on stretcher as she would not calm down or stop moving around.

## 2014-12-31 NOTE — Progress Notes (Signed)
Delivery Note  First Stage: Labor onset: 1800 Augmentation : None Analgesia /Anesthesia intrapartum: None SROM ROM at 0004 on 12/31/14  Second Stage: Complete dilation at 0008 Onset of pushing at 0008 FHR second stage: unable to monitor  Delivery of a viable infant girl at 0014 by [name], CNM in LOA position No nuchal cord Cord immediately cut and clamped at delivery and baby immediately transported to the warmer.  Cord blood sample collected   Third Stage: Placenta delivered shultz presentation intact with 3 VC @ 0019 Placenta disposition: pathology Uterine tone firm / bleeding moderate  No laceration identified  Est. Blood Loss (mL): 300  Complications: Patient arrived to Unit complaining of pain, kicking legs and refusing EFM, toco and IV access, despite  nurses discussion of the benefits of  EFM, toco and IV access. [name] CNM immediately at the bedside; Dr. Debroah Loop called to the bedside, NICU team present in the room.  At 0014 delivery of a viable infant girl in LOA position. After placenta delivered at 0019, 10 units of pitocin were given IM in right thigh.  Patient's perineum intact. Patient's GBS status was unknown; infant was untreated. Patient stated that she had been using crack cocaine last night; admitted to drug use. Patient had signed BLT paperwork but because of current drug use and Haldol will defer consent for BTL at this time.   Mom to  postpartum.  Baby to NICU.  Newborn: Birth Weight: 5 lbs, 1 oz.   Apgar Scores: 5/7/7 Feeding planned: breast  [name] [facility] 12/31/2014, 2:37 AM

## 2014-12-31 NOTE — Plan of Care (Signed)
Problem: Discharge Progression Outcomes Goal: Barriers To Progression Addressed/Resolved Outcome: Completed/Met Date Met:  12/31/14 Voiding without difficulty.

## 2014-12-31 NOTE — H&P (Signed)
HPI: Lindsey [name] is a 38 y.o. year old [id number] female at [redacted]w[redacted]d weeks gestation who presents to MAU by EMS out of control, thrashing, screaming. 5 cm in MAU. Transported to [facility] room 174, kicking, refusing monitoring, too out of control for IV start. Progressed rapidly to VSD/ VBAC. NICU present. See delivery note. Admits to using "a lot " of cocaine yesterday.     Clinic [facility] Prenatal Labs  Dating 1st trimester U/S Blood type: AB/POS/-- (02/02 1305)   Genetic Screen 1 Screen:    AFP:     Quad:     NIPS: Antibody:NEG (02/02 1305)  Anatomic US Wnl, recommend f/u for growth at 28weeks Rubella: 3.67 (02/02 1305)  GTT Early: 84     Third trimester: 86 RPR: NON REAC (02/02 1305)   Flu vaccine 07/11/14 HBsAg: NEGATIVE (02/02 1305)   TDaP vaccine  11/23/2014                                             Rhogam: AB pos HIV: NONREACTIVE (02/02 1305)   GBS  pos                                          (For PCN allergy, check sensitivities) GBS: positive  Contraception  BTL- papers to be signed 11/23/2014 Pap: negative (07/11/2014)  Baby Food breast Trich tx'd in MAU 6/21  Circumcision  n/a   Pediatrician    Support Person  FOB    Patient Active Problem List   Diagnosis Date Noted  . Vaginal delivery 12/31/2014  . Active labor 12/31/2014  . Cocaine abuse affecting pregnancy in third trimester 12/22/2014  . AMA (advanced maternal age) multigravida 35+ 08/23/2014  . Anxiety 08/10/2014  . Depressed 08/10/2014  . Hx of drug abuse 08/10/2014  . History of cesarean delivery 07/26/2014  . Supervision of normal subsequent pregnancy 07/11/2014   History OB History    Gravida Para Term Preterm AB TAB SAB Ectopic Multiple Living   5 3 3  0 2 0 2 0 0 3     Past Medical History  Diagnosis Date  . H/O drug abuse     Admits to crack cocaine use yesterday 12/21/2014  . Trichomonas   . GERD (gastroesophageal reflux disease)   . Hypertension   . Pregnancy induced hypertension    Past Surgical History   Procedure Laterality Date  . Cesarean section  03/19/2012    Procedure: CESAREAN SECTION;  Surgeon: Antionette Char, MD;  Location: [facility] ORS;  Service: Obstetrics;  Laterality: N/A;   Family History: family history includes Cancer in her maternal grandmother and mother; Diabetes in her father. There is no history of Anesthesia problems, Hypotension, Malignant hyperthermia, or Pseudochol deficiency. Social History:  reports that she has been smoking Cigarettes.  She has been smoking about 0.01 packs per day. She has never used smokeless tobacco. She reports that she uses illicit drugs ("Crack" cocaine). She reports that she does not drink alcohol.   Prenatal Transfer Tool  Maternal Diabetes: No Genetic Screening: Declined Maternal Ultrasounds/Referrals: Normal Fetal Ultrasounds or other Referrals:  None Maternal Substance Abuse:  Yes:  Type: Smoker, Cocaine Significant Maternal Medications:  None Significant Maternal Lab Results:  Lab values include: Group B Strep positive, Other: Pos trich  in pregnancy.  Other Comments:  Sporadic prenatal care. High on cocaine at delivery. Dangerous to baby at delivery.   Review of Systems  Unable to perform ROS: patient intoxicated  Psychiatric/Behavioral:       Admits to cocaine use.     Dilation: 10 Station: +2 Exam by:: [name], CNM Blood pressure 135/78, pulse 84, temperature 98.3 F (36.8 C), temperature source Oral, resp. rate 18, last menstrual period 04/16/2014, unknown if currently breastfeeding. Maternal Exam:  Uterine Assessment: Contraction strength is firm.  Contraction frequency is regular.   Abdomen: Patient reports no abdominal tenderness. Estimated fetal weight is 5 lb.   Fetal presentation: vertex  Introitus: Normal vulva. Amniotic fluid character: clear.  Pelvis: adequate for delivery.   Cervix: Cervix evaluated by digital exam.     Fetal Exam Fetal Monitor Review: Mode: ultrasound.   Baseline rate: UTA due to  patient lack of sooperation. Possible audible FHR. Multiple attempts to monitor made. Marland Kitchen      Physical Exam  Nursing note and vitals reviewed. Constitutional: She appears well-developed. She appears distressed.  Underweight.  HENT:  Head: Normocephalic.  Eyes:  UTA  Cardiovascular: Normal rate.   Respiratory: Effort normal. No respiratory distress.  GI: Soft. There is no tenderness.  Genitourinary: Vagina normal.  Musculoskeletal: She exhibits no edema.  Neurological: She is alert.  Skin: Skin is warm and dry.  Psychiatric: Her affect is inappropriate. She is agitated, aggressive, hyperactive and combative. She expresses impulsivity and inappropriate judgment.    Prenatal labs: ABO, Rh: AB/POS/-- (02/02 1305) Antibody: NEG (02/02 1305) Rubella: 3.67 (02/02 1305) RPR: NON REAC (06/16 1426)  HBsAg: NEGATIVE (02/02 1305)  HIV: NONREACTIVE (06/16 1426)  GBS: Positive (07/18 0000)   Assessment: 1. Labor: Transition -> Precip delivery 2. Fetal Wellbeing: Category UTA due to patient refusal.   3. Pain Control: None 4. GBS: Pos--Untreated 5. 37.0 week IUP 6. High on Cocaine 7. Elevated BP--Possibly due to cocaine, but will assess for Pre-E 8. Planning BTL.   Plan:  1. Admit to BS per consult with MD 2. Routine L&D orders 3. Analgesia/anesthesia PRN  4. Pre-E labs. 5. Haldol 6. SW consult. 7. Will discuss BTL when pt no longer under influence of Cocaine, Haldol. 8. NPO after breakfast. 9. Drugs of abuse screen  Brayton Baumgartner 12/31/2014, 2:24 AM

## 2014-12-31 NOTE — MAU Note (Signed)
Arrived by EMS.  See progress notes.

## 2014-12-31 NOTE — Plan of Care (Signed)
Problem: Discharge Progression Outcomes Goal: Activity appropriate for discharge plan Outcome: Completed/Met Date Met:  12/31/14 Ambulatory and up doing personal care without difficulty. Goal: Tolerating diet Outcome: Completed/Met Date Met:  12/31/14 Tolerates Regular diet well. Goal: Complications resolved/controlled Outcome: Completed/Met Date Met:  12/31/14 Not having jerky type movements anymore. Goal: Pain controlled with appropriate interventions Outcome: Completed/Met Date Met:  12/31/14 Good pain control on Motrin.

## 2015-01-01 ENCOUNTER — Inpatient Hospital Stay (HOSPITAL_COMMUNITY): Payer: Medicaid Other | Admitting: Anesthesiology

## 2015-01-01 ENCOUNTER — Encounter (HOSPITAL_COMMUNITY)
Admission: AD | Disposition: A | Discharge status: Home / Self Care | Payer: Self-pay | Source: Ambulatory Visit | Attending: Obstetrics & Gynecology

## 2015-01-01 DIAGNOSIS — Z302 Encounter for sterilization: Secondary | ICD-10-CM

## 2015-01-01 HISTORY — PX: TUBAL LIGATION: SHX77

## 2015-01-01 LAB — MRSA PCR SCREENING: MRSA BY PCR: NEGATIVE

## 2015-01-01 SURGERY — LIGATION, FALLOPIAN TUBE, POSTPARTUM
Anesthesia: General | Site: Abdomen | Laterality: Bilateral

## 2015-01-01 MED ORDER — DEXAMETHASONE SODIUM PHOSPHATE 10 MG/ML IJ SOLN
INTRAMUSCULAR | Status: AC
  Filled 2015-01-01: qty 1

## 2015-01-01 MED ORDER — PROMETHAZINE HCL 25 MG/ML IJ SOLN: 6.2500 mg | INTRAMUSCULAR | Status: DC | PRN

## 2015-01-01 MED ORDER — GLYCOPYRROLATE 0.2 MG/ML IJ SOLN
INTRAMUSCULAR | Status: DC | PRN
  Administered 2015-01-01: 0.2 mg via INTRAVENOUS

## 2015-01-01 MED ORDER — PROPOFOL 10 MG/ML IV BOLUS
INTRAVENOUS | Status: DC | PRN
  Administered 2015-01-01: 200 mg via INTRAVENOUS

## 2015-01-01 MED ORDER — FENTANYL CITRATE (PF) 100 MCG/2ML IJ SOLN: 25.0000 ug | INTRAMUSCULAR | Status: DC | PRN

## 2015-01-01 MED ORDER — ONDANSETRON HCL 4 MG/2ML IJ SOLN
INTRAMUSCULAR | Status: DC | PRN
  Administered 2015-01-01: 4 mg via INTRAVENOUS

## 2015-01-01 MED ORDER — ONDANSETRON HCL 4 MG/2ML IJ SOLN
INTRAMUSCULAR | Status: AC
  Filled 2015-01-01: qty 2

## 2015-01-01 MED ORDER — ROCURONIUM BROMIDE 100 MG/10ML IV SOLN
INTRAVENOUS | Status: AC
  Filled 2015-01-01: qty 1

## 2015-01-01 MED ORDER — DEXAMETHASONE SODIUM PHOSPHATE 10 MG/ML IJ SOLN
INTRAMUSCULAR | Status: DC | PRN
  Administered 2015-01-01: 5 mg via INTRAVENOUS

## 2015-01-01 MED ORDER — PROPOFOL 10 MG/ML IV BOLUS
INTRAVENOUS | Status: AC
  Filled 2015-01-01: qty 20

## 2015-01-01 MED ORDER — BUPIVACAINE HCL (PF) 0.25 % IJ SOLN
INTRAMUSCULAR | Status: AC
  Filled 2015-01-01: qty 30

## 2015-01-01 MED ORDER — MIDAZOLAM HCL 2 MG/2ML IJ SOLN
INTRAMUSCULAR | Status: AC
  Filled 2015-01-01: qty 2

## 2015-01-01 MED ORDER — LACTATED RINGERS IV SOLN
INTRAVENOUS | Status: DC | PRN
  Administered 2015-01-01: 14:00:00 via INTRAVENOUS

## 2015-01-01 MED ORDER — LIDOCAINE HCL (CARDIAC) 20 MG/ML IV SOLN
INTRAVENOUS | Status: AC
  Filled 2015-01-01: qty 5

## 2015-01-01 MED ORDER — SUCCINYLCHOLINE CHLORIDE 20 MG/ML IJ SOLN
INTRAMUSCULAR | Status: DC | PRN
  Administered 2015-01-01: 100 mg via INTRAVENOUS

## 2015-01-01 MED ORDER — KETOROLAC TROMETHAMINE 30 MG/ML IJ SOLN
INTRAMUSCULAR | Status: DC | PRN
  Administered 2015-01-01: 30 mg via INTRAVENOUS

## 2015-01-01 MED ORDER — KETOROLAC TROMETHAMINE 30 MG/ML IJ SOLN
INTRAMUSCULAR | Status: AC
  Filled 2015-01-01: qty 1

## 2015-01-01 MED ORDER — FENTANYL CITRATE (PF) 250 MCG/5ML IJ SOLN
INTRAMUSCULAR | Status: AC
  Filled 2015-01-01: qty 5

## 2015-01-01 MED ORDER — BUPIVACAINE HCL (PF) 0.25 % IJ SOLN
INTRAMUSCULAR | Status: DC | PRN
Start: 2015-01-01 — End: 2015-01-01
  Administered 2015-01-01: 6 mL

## 2015-01-01 MED ORDER — MIDAZOLAM HCL 2 MG/2ML IJ SOLN
INTRAMUSCULAR | Status: DC | PRN
  Administered 2015-01-01: 2 mg via INTRAVENOUS

## 2015-01-01 MED ORDER — FENTANYL CITRATE (PF) 100 MCG/2ML IJ SOLN
INTRAMUSCULAR | Status: DC | PRN
  Administered 2015-01-01: 100 ug via INTRAVENOUS
  Administered 2015-01-01: 150 ug via INTRAVENOUS

## 2015-01-01 MED ORDER — GLYCOPYRROLATE 0.2 MG/ML IJ SOLN
INTRAMUSCULAR | Status: AC
  Filled 2015-01-01: qty 3

## 2015-01-01 MED ORDER — MEPERIDINE HCL 25 MG/ML IJ SOLN: 6.2500 mg | INTRAMUSCULAR | Status: DC | PRN

## 2015-01-01 MED ORDER — NEOSTIGMINE METHYLSULFATE 10 MG/10ML IV SOLN
INTRAVENOUS | Status: AC
  Filled 2015-01-01: qty 1

## 2015-01-01 MED ORDER — LIDOCAINE HCL (CARDIAC) 20 MG/ML IV SOLN
INTRAVENOUS | Status: DC | PRN
  Administered 2015-01-01: 50 mg via INTRAVENOUS

## 2015-01-01 SURGICAL SUPPLY — 22 items
BLADE SURG 11 STRL SS (BLADE) ×3 IMPLANT
CHLORAPREP W/TINT 26ML (MISCELLANEOUS) ×3 IMPLANT
CLIP FILSHIE TUBAL LIGA STRL (Clip) ×3 IMPLANT
CLOTH BEACON ORANGE TIMEOUT ST (SAFETY) ×3 IMPLANT
DRSG OPSITE POSTOP 3X4 (GAUZE/BANDAGES/DRESSINGS) ×3 IMPLANT
GLOVE BIO SURGEON STRL SZ 6.5 (GLOVE) ×2 IMPLANT
GLOVE BIO SURGEONS STRL SZ 6.5 (GLOVE) ×1
GLOVE BIOGEL PI IND STRL 7.0 (GLOVE) ×1 IMPLANT
GLOVE BIOGEL PI INDICATOR 7.0 (GLOVE) ×2
GOWN STRL REUS W/TWL LRG LVL3 (GOWN DISPOSABLE) ×6 IMPLANT
LIQUID BAND (GAUZE/BANDAGES/DRESSINGS) ×2 IMPLANT
NEEDLE HYPO 22GX1.5 SAFETY (NEEDLE) ×3 IMPLANT
NS IRRIG 1000ML POUR BTL (IV SOLUTION) ×3 IMPLANT
PACK ABDOMINAL MINOR (CUSTOM PROCEDURE TRAY) ×3 IMPLANT
SPONGE LAP 4X18 X RAY DECT (DISPOSABLE) IMPLANT
SUT VIC AB 0 CT1 27 (SUTURE) ×3
SUT VIC AB 0 CT1 27XBRD ANBCTR (SUTURE) ×1 IMPLANT
SUT VICRYL 4-0 PS2 18IN ABS (SUTURE) ×3 IMPLANT
SYR CONTROL 10ML LL (SYRINGE) ×3 IMPLANT
TOWEL OR 17X24 6PK STRL BLUE (TOWEL DISPOSABLE) ×6 IMPLANT
TRAY FOLEY BAG SILVER LF 16FR (SET/KITS/TRAYS/PACK) ×3 IMPLANT
WATER STERILE IRR 1000ML POUR (IV SOLUTION) ×3 IMPLANT

## 2015-01-01 NOTE — Op Note (Signed)
Lindsey [name] 12/30/2014 - 01/01/2015  PREOPERATIVE DIAGNOSIS:  Multiparity, undesired fertility  POSTOPERATIVE DIAGNOSIS:  Multiparity, undesired fertility  PROCEDURE:  Postpartum Bilateral Tubal Sterilization using Filshie Clips   ANESTHESIA:  Epidural  COMPLICATIONS:  None immediate.  ESTIMATED BLOOD LOSS:  Less than 20 ml.  FLUIDS: 1000 ml LR.  URINE OUTPUT:  50 ml of clear urine.  INDICATIONS: 39 y.o. [id number]  with undesired fertility,status post vaginal delivery, desires permanent sterilization. Risks and benefits of procedure discussed with patient including permanence of method, bleeding, infection, injury to surrounding organs and need for additional procedures. Risk failure of 0.5-1% with increased risk of ectopic gestation if pregnancy occurs was also discussed with patient.   FINDINGS:  Normal uterus, tubes, and ovaries.  TECHNIQUE:  The patient was taken to the operating room where her epidural anesthesia was dosed up to surgical level and found to be adequate.  She was then placed in the dorsal supine position and prepped and draped in sterile fashion.  After an adequate timeout was performed, attention was turned to the patient's abdomen where a small transverse skin incision was made under the umbilical fold. The incision was taken down to the layer of fascia using the scalpel, and fascia was incised, and extended bilaterally using Mayo scissors. The peritoneum was entered in a sharp fashion. Attention was then turned to the patient's uterus, and left fallopian tube was identified and followed out to the fimbriated end.  A Filshie clip was placed on the left fallopian tube about 2 cm from the cornual attachment, with care given to incorporate the underlying mesosalpinx.  A similar process was carried out on the right side allowing for bilateral tubal sterilization.  Good hemostasis was noted overall.  Local analgesia was drizzled on both operative sites.The instruments were  then removed from the patient's abdomen and the fascial incision was repaired with 0 Vicryl, and the skin was closed with a 4-0 Vicryl subcuticular stitch. The patient tolerated the procedure well.  Sponge, lap, and needle counts were correct times two.  The patient was then taken to the recovery room awake, extubated and in stable condition.   Adam Phenix, MD 01/01/2015 3:03 PM ]

## 2015-01-01 NOTE — Anesthesia Procedure Notes (Signed)
Procedure Name: Intubation Date/Time: 01/01/2015 2:25 PM Performed by: Harriett Rush, Linnea Todisco A Pre-anesthesia Checklist: Patient identified, Emergency Drugs available, Suction available and Patient being monitored Patient Re-evaluated:Patient Re-evaluated prior to inductionOxygen Delivery Method: Circle system utilized Preoxygenation: Pre-oxygenation with 100% oxygen Intubation Type: IV induction, Rapid sequence and Cricoid Pressure applied Laryngoscope Size: Miller and 2 Grade View: Grade II Tube type: Oral Tube size: 6.0 mm Number of attempts: 1 Secured at: 22 cm Tube secured with: Tape Dental Injury: Teeth and Oropharynx as per pre-operative assessment

## 2015-01-01 NOTE — Progress Notes (Addendum)
CLINICAL SOCIAL WORK MATERNAL/CHILD NOTE  Patient Details  Name: Lindsey [name] MRN: [id number] Date of Birth: 12/31/2015  Date:  01/01/2015  Clinical Social Worker Initiating Note:  Loleta Books, LCSW Date/ Time Initiated:  01/01/15/1000     Child's Name:  Lindsey [name]   Legal Guardian:  Mother : Lindsey [name]  Need for Interpreter:  None   Date of Referral:  12/31/14     Reason for Referral:  Homelessness , Current Substance Use/Substance Use During Pregnancy    Referral Source:  NICU   Address:  [address] [place], [place]   Phone number:  [phone]   Household Members:  Previously was homeless, will be moving to residential treatment at discharge.  Natural Supports (not living in the home):  Immediate Family   Professional Supports: MOB to start residential treatment at [facility] once discharged from the hospital.    Employment:     Type of Work: Therapist, occupational business   Education:    N/A  Surveyor, quantity Resources:  [employer]   Other Resources:    None identified  Cultural/Religious Considerations Which May Impact Care:  None reported  Strengths:  Presents as motivated to address mental health concerns, she acknowledges that she needs help and assistance prior to caring for this infant.   Risk Factors/Current Problems:   1)Basic Needs: MOB is currently homeless. She reported having "few personal items".   2) Mental Health Concerns: MOB reported history of mental health diagnoses, but did not clarify diagnosis.  3)DHHS Involvement : MOB does not have custody of her first two children.  CPS report made on 7/24 regarding safety of this infant.  4)Legal Issues : MOB reported that she has court on 7/26 due to breaking a glass window at a hotel.  5)Substance Use : MOB presents with chronic use of cocaine for 15 years. She reported consistent use during the pregnancy, and had +UDS for cocaine in June and July.  MOB voices intentions to participate in  residential treatment program at discharge. Infant's UDS is positive for cocaine.   Cognitive State:  Able to Concentrate , Alert , Linear Thinking , Goal Oriented    Mood/Affect:  Anxious , Tearful , Sad    CSW Assessment:  CSW received request for consult due to substance use during the pregnancy.  MOB presented as easily engaged and receptive to the visit.  Prior to assessment, CSW reviewed notes related to MOB's presentation during prenatal and MAU visits which included reports of homelessness, chronic cocaine use, and then arriving combative and intoxicated at time of delivery.  MOB continues to present as agitated, but she is able to engage in a linear and goal orientated conversation.  MOB provided consent for her sister, Kenedy Haisley to participate in the assessment. MOB's sister excused herself when CSW began to explore MOB's mental health and how she feels related to the current plan of care for herself and the infant.   CSW reviewed substance use history. MOB reported history of cocaine use for 15 years.  She stated that she has a long history of use, and discussed her previous efforts on reducing/ceasing her use.  MOB discussed how her sister is currently has full custody of her other children due to substance use and inability to secure stable housing.  MOB shared that she and her sister have already discussed this infant going with her sister as she needs time to "get my feet on the ground".  With MOB's consent, CSW spoke with [place]  from [facility], a residential substance abuse program in [place].  When MOB is medically ready for discharge, program will pick her up, complete intake, and provide her with immediate housing.  MOB will participate in programming, will be able to work (already has a job), and will work toward sobriety.  MOB shared that she is tired of her substance use and is motivated to complete the program.  She discussed frustrations when she feels that no one  believes in her, and presented as receptive to exploring her personal strengths that have assisted her to re-start treatment even when she could easily give up.  MOB shared that she cannot imagine going through another "loss" of a child to her sister, and continued to report motivation to address her substance abuse concerns postpartum.    MOB and MOB's sister aware that CPS will need to complete assessment and assist with discharge planning.  MOB verbalized understanding, and agreed to meet with CPS at the hospital prior to her discharge.    CSW also provided education on hospital drug screen policy. MOB and MOB's sister are aware that the infant had a positive drug screen for cocaine.  MOB denied additional substance use during the pregnancy.    MOB expressed appreciation for the visit and support. She stated that she has been tearful and emotional as she anticipates and expects the upcoming separation from the infant. MOB shared that she knows that the infant will be well taken care of, but it continues to be emotionally difficult for her since she wants to parent and raise this infant.  MOB is agreeable to ongoing CPS involvement to ensure safe discharge plan for the infant.   CSW Plan/Description:   1)Child Protective Service Report: Report made 7/24 by Cumi Bevel, LCSW.  A.Carmichael is the assigned case worker. CPS arrived at the hospital at 11:00am to complete assessment.  2)Patient/Family Education: Hospital drug screen policy 3)Psychosocial Support and Ongoing Assessment of Needs: CSW to follow up with CPS to receive discharge recommendations.   Update at 3:15: CSW spoke with CPS worker after initial assessment was completed.  CPS worker reported that a TDM will be held on 7/27 at 9:00am at [facility]  CPS worker stated that she has already spoken with the aunt and completed a home visit.  CPS worker shared that the aunt will likely be the approved discharged plan for the infant when medically  ready.  CSW will continue to follow.     Pervis Hocking, LCSW 01/01/2015, 12:10 PM

## 2015-01-01 NOTE — Progress Notes (Signed)
CSW completed chart review and report from weekend social worker.  CSW notes that infant's UDS is positive for cocaine.  CSW spoke with A.Carmichael, assigned CPS worker.  CPS reported that she will be meeting with the MOB at the hospital in order to complete her initial assessment.  CPS worker can be reached at [phone]).  CPS worker to follow up with CSW once assessment is complete. 

## 2015-01-01 NOTE — Progress Notes (Signed)
Post Partum Day 1 Subjective: Patient is doing well, she reports generalized body aches. She denies chest pain, SOB, lightheadedness/dizziness.  Objective: Blood pressure 122/77, pulse 57, temperature 98 F (36.7 C), temperature source Oral, resp. rate 19, height  (1.626 m), weight 141 lb (63.957 kg), last menstrual period 04/16/2014, SpO2 100 %, unknown if currently breastfeeding.  Physical Exam:  General: alert, cooperative and no distress Lochia: appropriate Uterine Fundus: firm DVT Evaluation: No evidence of DVT seen on physical exam. No cords or calf tenderness. No significant calf/ankle edema.   Recent Labs  12/31/14 0250  HGB 11.9*  HCT 34.9*    Assessment/Plan: - Patient is hemodynamically stable - Patient with desire for permanent sterilization. Consent was signed this morning. Risks and benefits of procedure discussed with patient including permanence of method, bleeding, infection, injury to surrounding organs and need for additional procedures. Risk failure of 0.5-1% with increased risk of ectopic gestation if pregnancy occurs was also discussed with patient. Patient verbalized understanding.  The procedure will be performed. Patient reports last po intake at 5 am. She ate some crackers.     LOS: 1 day   Samarion Ehle 01/01/2015, 7:37 AM

## 2015-01-01 NOTE — Anesthesia Preprocedure Evaluation (Addendum)
Anesthesia Evaluation  Patient identified by MRN, date of birth, ID band Patient awake    Reviewed: Allergy & Precautions, NPO status , Patient's Chart, lab work & pertinent test results, reviewed documented beta blocker date and time   Airway Mallampati: II       Dental  (+) Dental Advisory Given   Pulmonary Current Smoker,  breath sounds clear to auscultation        Cardiovascular hypertension, negative cardio ROS  Rhythm:Regular     Neuro/Psych Anxiety Depression    GI/Hepatic GERD-  ,(+)     substance abuse  cocaine use,   Endo/Other    Renal/GU negative Renal ROS     Musculoskeletal   Abdominal   Peds  Hematology 11/34   Anesthesia Other Findings   Reproductive/Obstetrics Delivery 12/31/2014                            Anesthesia Physical Anesthesia Plan  ASA: II  Anesthesia Plan: General   Post-op Pain Management:    Induction: Intravenous  Airway Management Planned: Oral ETT  Additional Equipment:   Intra-op Plan:   Post-operative Plan: Extubation in OR  Informed Consent: I have reviewed the patients History and Physical, chart, labs and discussed the procedure including the risks, benefits and alternatives for the proposed anesthesia with the patient or authorized representative who has indicated his/her understanding and acceptance.     Plan Discussed with:   Anesthesia Plan Comments:         Anesthesia Quick Evaluation

## 2015-01-01 NOTE — Transfer of Care (Signed)
Immediate Anesthesia Transfer of Care Note  Patient: Lindsey [name]  Procedure(s) Performed: Procedure(s): POST PARTUM TUBAL LIGATION (Bilateral)  Patient Location: PACU  Anesthesia Type:General  Level of Consciousness: awake, alert  and oriented  Airway & Oxygen Therapy: Patient Spontanous Breathing and Patient connected to nasal cannula oxygen  Post-op Assessment: Report given to RN and Post -op Vital signs reviewed and stable  Post vital signs: Reviewed and stable  Last Vitals:  Filed Vitals:   01/01/15 1219  BP: 134/77  Pulse: 63  Temp: 36.4 C  Resp: 20    Complications: No apparent anesthesia complications

## 2015-01-01 NOTE — Anesthesia Postprocedure Evaluation (Signed)
  Anesthesia Post-op Note  Patient: Lindsey [name]  Procedure(s) Performed: Procedure(s): POST PARTUM TUBAL LIGATION (Bilateral) Patient is awake and responsive. Pain and nausea are reasonably well controlled. Vital signs are stable and clinically acceptable. Oxygen saturation is clinically acceptable. There are no apparent anesthetic complications at this time. Patient is ready for discharge.

## 2015-01-02 MED ORDER — OXYCODONE-ACETAMINOPHEN 5-325 MG PO TABS: 1.0000 | ORAL_TABLET | Freq: Four times a day (QID) | ORAL | Status: DC | PRN

## 2015-01-02 NOTE — Progress Notes (Signed)
Pt discharged to home.  Pt ambulated to NICU with plans to leave hospital from there.  She states her sister is taking her home to collect her clothes and then to [facility] to begin rehab.  Condition stable.  No equipment for home ordered at discharge.

## 2015-01-02 NOTE — Progress Notes (Signed)
Post Partum Day 2 Subjective:  Lindsey [name] is a 39 y.o. [id number] [redacted]w[redacted]d s/p SVD (PPD#2) and BTL (POD#1).  No acute events overnight.  Pt reports mild difficulty w/ ambulation due to tingling and weakness in her legs L>R which has persisted throughout pregnancy. She reports no problems voiding or po intake.  She denies nausea or vomiting.  Pain is moderately controlled.  She has had flatus. She has had bowel movement.  Lochia Minimal.  Plan for birth control is bilateral tubal ligation.  Method of Feeding: bottle.  Objective: Blood pressure 115/63, pulse 66, temperature 98.3 F (36.8 C), temperature source Oral, resp. rate 14, height  (1.626 m), weight 63.957 kg (141 lb), last menstrual period 04/16/2014, SpO2 98 %, unknown if currently breastfeeding.  Physical Exam:  General: alert, cooperative and no distress Lochia:normal flow Chest: CTAB Heart: RRR no m/r/g Abdomen: +BS, soft, nontender,  Uterine Fundus: firm and nontender DVT Evaluation: No evidence of DVT seen on physical exam. Extremities: mild 1+ pitting edema to the ankles bilaterally; pt reports tingling sensation in both feet, but there is no numbness on exam; decrease strength in L foot 2/2 effort   Recent Labs  12/31/14 0250  HGB 11.9*  HCT 34.9*    Assessment/Plan:  ASSESSMENT: Lindsey [name] is a 39 y.o. [id number] [redacted]w[redacted]d s/p SVD and BTL who complains of several months of tingling in her feet.  Discharge home, Social Work consult and Contraception BTL  Instructed pt that feet will likely need assessment as an OP at f/u.   LOS: 2 days   Lowanda Foster 01/02/2015, 7:42 AM

## 2015-01-02 NOTE — Discharge Instructions (Signed)

## 2015-01-02 NOTE — Discharge Summary (Signed)
Obstetric Discharge Summary Reason for Admission: onset of labor and cocaine abuse Prenatal Procedures: none Intrapartum Procedures: spontaneous vaginal delivery Postpartum Procedures: P.P. tubal ligation Complications-Operative and Postpartum: none  Delivery Note  First Stage: Labor onset: 1800 Augmentation : None Analgesia /Anesthesia intrapartum: None SROM ROM at 0004 on 12/31/14  Second Stage: Complete dilation at 0008 Onset of pushing at 0008 FHR second stage: unable to monitor  Delivery of a viable infant girl at 0014 by [name], CNM in LOA position No nuchal cord Cord immediately cut and clamped at delivery and baby immediately transported to the warmer.  Cord blood sample collected   Third Stage: Placenta delivered shultz presentation intact with 3 VC @ 0019 Placenta disposition: pathology Uterine tone firm / bleeding moderate  No laceration identified  Est. Blood Loss (mL): 300  Complications: Patient arrived to Unit complaining of pain, kicking legs and refusing EFM, toco and IV access, despite nurses discussion of the benefits of EFM, toco and IV access. [name] CNM immediately at the bedside; Dr. Debroah Loop called to the bedside, NICU team present in the room. At 0014 delivery of a viable infant girl in LOA position. After placenta delivered at 0019, 10 units of pitocin were given IM in right thigh.  Patient's perineum intact. Patient's GBS status was unknown; infant was untreated. Patient stated that she had been using crack cocaine last night; admitted to drug use. Patient had signed BLT paperwork but because of current drug use and Haldol will defer consent for BTL at this time.   Mom to postpartum. Baby to NICU.  Newborn: Birth Weight: 5 lbs, 1 oz.  Apgar Scores: 5/7/7 Feeding planned: breast  Hospital Course:  Active Problems:   Vaginal delivery   Active labor   Lindsey [name] is a 38 y.o. [id number] s/p SVD (PPD#2) and BTL (POD#1).  Patient  was admitted 7/24.  She has postpartum course that was uncomplicated. The pt feels ready to go home and  will be discharged with outpatient follow-up.   Today: No acute events overnight.  Pt denies problems with ambulating, voiding or po intake.  She denies nausea or vomiting.  Pain is well controlled.  She has had flatus. She has had bowel movement.  Lochia Minimal.  Plan for birth control is  bilateral tubal ligation.  Method of Feeding: Bottle  Physical Exam:  General: alert, cooperative and appears stated age 35: appropriate Uterine Fundus: firm Incision: healing well DVT Evaluation: No evidence of DVT seen on physical exam. Extremities: mild 1+ pitting edema to the ankles bilaterally; pt reports tingling sensation in both feet, but there is no numbness on exam; decrease strength in L foot 2/2 effort   H/H: Lab Results  Component Value Date/Time   HGB 11.9* 12/31/2014 02:50 AM   HCT 34.9* 12/31/2014 02:50 AM    Discharge Diagnoses: Term Pregnancy-delivered  Discharge Information: Date: 01/02/2015 Activity: pelvic rest Diet: routine  Medications: Ibuprofen Breast feeding:  No: Bottle Condition: stable Instructions: refer to handout Discharge to: home  Pt will need evaluation of peripheral neuropathic symptoms at f/u, though these may be residual symptoms from her violent delivery.     Discharge Instructions    Discharge patient    Complete by:  As directed             Medication List    TAKE these medications        oxyCODONE-acetaminophen 5-325 MG per tablet  Commonly known as:  PERCOCET/ROXICET  Take 1 tablet by mouth  every 6 (six) hours as needed for moderate pain or severe pain.       Follow-up Information    Follow up with [facility].   Specialty:  Obstetrics and Gynecology   Why:  for postpartum visit   Contact information:   [address] [place] [address] [phone]      Go to [facility] ED.   Why:  If  symptoms in legs worsen   Contact information:   [address] [place] [phone]       Lowanda Foster ,MD 01/02/2015,9:42 AM

## 2015-01-03 ENCOUNTER — Inpatient Hospital Stay (HOSPITAL_COMMUNITY)
Admission: AD | Admit: 2015-01-03 | Discharge: 2015-01-03 | Discharge status: Against Medical Advice | Payer: Medicaid Other | Source: Ambulatory Visit | Attending: Obstetrics & Gynecology | Admitting: Obstetrics & Gynecology

## 2015-01-03 ENCOUNTER — Encounter (HOSPITAL_COMMUNITY): Payer: Self-pay | Admitting: Obstetrics & Gynecology

## 2015-01-03 DIAGNOSIS — O99323 Drug use complicating pregnancy, third trimester: Secondary | ICD-10-CM

## 2015-01-03 DIAGNOSIS — F1410 Cocaine abuse, uncomplicated: Secondary | ICD-10-CM

## 2015-01-03 DIAGNOSIS — Z3483 Encounter for supervision of other normal pregnancy, third trimester: Secondary | ICD-10-CM

## 2015-01-03 DIAGNOSIS — R109 Unspecified abdominal pain: Secondary | ICD-10-CM | POA: Diagnosis not present

## 2015-01-03 LAB — URINE MICROSCOPIC-ADD ON

## 2015-01-03 LAB — URINALYSIS, ROUTINE W REFLEX MICROSCOPIC
Bilirubin Urine: NEGATIVE
GLUCOSE, UA: NEGATIVE mg/dL
Ketones, ur: NEGATIVE mg/dL
LEUKOCYTES UA: NEGATIVE
Nitrite: NEGATIVE
Protein, ur: 30 mg/dL — AB
Specific Gravity, Urine: >1.03 — ABNORMAL HIGH (ref 1.005–1.030)
UROBILINOGEN UA: 0.2 mg/dL (ref 0.0–1.0)
pH: 7 (ref 5.0–8.0)

## 2015-01-03 NOTE — MAU Note (Signed)
P/P  Delivered on 12/30/14. Pt had tubal after/ c/o abd pain and breast tenderness. Percocet is not helping. Had small bm today.

## 2015-01-04 ENCOUNTER — Encounter: Payer: Medicaid Other | Admitting: Obstetrics & Gynecology

## 2015-01-04 ENCOUNTER — Inpatient Hospital Stay (HOSPITAL_COMMUNITY)
Admission: AD | Admit: 2015-01-04 | Discharge: 2015-01-04 | Disposition: A | Discharge status: Home / Self Care | Payer: Medicaid Other | Source: Ambulatory Visit | Attending: Obstetrics & Gynecology | Admitting: Obstetrics & Gynecology

## 2015-01-04 ENCOUNTER — Encounter (HOSPITAL_COMMUNITY): Payer: Self-pay | Admitting: *Deleted

## 2015-01-04 DIAGNOSIS — O9089 Other complications of the puerperium, not elsewhere classified: Secondary | ICD-10-CM | POA: Insufficient documentation

## 2015-01-04 DIAGNOSIS — F1420 Cocaine dependence, uncomplicated: Secondary | ICD-10-CM | POA: Insufficient documentation

## 2015-01-04 DIAGNOSIS — O99325 Drug use complicating the puerperium: Secondary | ICD-10-CM | POA: Insufficient documentation

## 2015-01-04 DIAGNOSIS — IMO0001 Reserved for inherently not codable concepts without codable children: Secondary | ICD-10-CM

## 2015-01-04 DIAGNOSIS — R030 Elevated blood-pressure reading, without diagnosis of hypertension: Secondary | ICD-10-CM | POA: Insufficient documentation

## 2015-01-04 DIAGNOSIS — R109 Unspecified abdominal pain: Secondary | ICD-10-CM | POA: Diagnosis present

## 2015-01-04 DIAGNOSIS — R52 Pain, unspecified: Secondary | ICD-10-CM

## 2015-01-04 DIAGNOSIS — F17210 Nicotine dependence, cigarettes, uncomplicated: Secondary | ICD-10-CM | POA: Diagnosis not present

## 2015-01-04 DIAGNOSIS — F1410 Cocaine abuse, uncomplicated: Secondary | ICD-10-CM

## 2015-01-04 LAB — URINALYSIS, ROUTINE W REFLEX MICROSCOPIC
BILIRUBIN URINE: NEGATIVE
BILIRUBIN URINE: NEGATIVE
GLUCOSE, UA: NEGATIVE mg/dL
GLUCOSE, UA: NEGATIVE mg/dL
KETONES UR: NEGATIVE mg/dL
Ketones, ur: NEGATIVE mg/dL
LEUKOCYTES UA: NEGATIVE
NITRITE: NEGATIVE
Nitrite: NEGATIVE
PROTEIN: NEGATIVE mg/dL
Protein, ur: NEGATIVE mg/dL
SPECIFIC GRAVITY, URINE: 1.02 (ref 1.005–1.030)
Specific Gravity, Urine: 1.01 (ref 1.005–1.030)
Urobilinogen, UA: 0.2 mg/dL (ref 0.0–1.0)
Urobilinogen, UA: 0.2 mg/dL (ref 0.0–1.0)
pH: 6.5 (ref 5.0–8.0)
pH: 6.5 (ref 5.0–8.0)

## 2015-01-04 LAB — URINE MICROSCOPIC-ADD ON

## 2015-01-04 LAB — COMPREHENSIVE METABOLIC PANEL
ALT: 38 U/L (ref 14–54)
ANION GAP: 1 — AB (ref 5–15)
AST: 28 U/L (ref 15–41)
Albumin: 3 g/dL — ABNORMAL LOW (ref 3.5–5.0)
Alkaline Phosphatase: 116 U/L (ref 38–126)
BUN: 12 mg/dL (ref 6–20)
CALCIUM: 8.3 mg/dL — AB (ref 8.9–10.3)
CHLORIDE: 110 mmol/L (ref 101–111)
CO2: 28 mmol/L (ref 22–32)
Creatinine, Ser: 0.88 mg/dL (ref 0.44–1.00)
GFR calc Af Amer: >60 mL/min (ref >60)
GFR calc non Af Amer: >60 mL/min (ref >60)
GLUCOSE: 94 mg/dL (ref 65–99)
Potassium: 3.8 mmol/L (ref 3.5–5.1)
Sodium: 139 mmol/L (ref 135–145)
Total Bilirubin: 0.5 mg/dL (ref 0.3–1.2)
Total Protein: 6.8 g/dL (ref 6.5–8.1)

## 2015-01-04 LAB — RAPID URINE DRUG SCREEN, HOSP PERFORMED
Amphetamines: NOT DETECTED
BARBITURATES: NOT DETECTED
BENZODIAZEPINES: NOT DETECTED
COCAINE: POSITIVE — AB
Opiates: NOT DETECTED
Tetrahydrocannabinol: NOT DETECTED

## 2015-01-04 LAB — CBC
HCT: 37.3 % (ref 36.0–46.0)
HEMOGLOBIN: 12.1 g/dL (ref 12.0–15.0)
MCH: 29.3 pg (ref 26.0–34.0)
MCHC: 32.4 g/dL (ref 30.0–36.0)
MCV: 90.3 fL (ref 78.0–100.0)
Platelets: 278 10*3/uL (ref 150–400)
RBC: 4.13 MIL/uL (ref 3.87–5.11)
RDW: 14.9 % (ref 11.5–15.5)
WBC: 5.5 10*3/uL (ref 4.0–10.5)

## 2015-01-04 LAB — PROTEIN / CREATININE RATIO, URINE
Creatinine, Urine: 51 mg/dL
Protein Creatinine Ratio: 0.22 mg/mg{Cre} — ABNORMAL HIGH (ref 0.00–0.15)
Total Protein, Urine: 11 mg/dL

## 2015-01-04 MED ORDER — KETOROLAC TROMETHAMINE 60 MG/2ML IM SOLN
60.0000 mg | Freq: Once | INTRAMUSCULAR | Status: AC
  Administered 2015-01-04: 60 mg via INTRAMUSCULAR
  Filled 2015-01-04: qty 2

## 2015-01-04 MED ORDER — LABETALOL HCL 5 MG/ML IV SOLN: 20.0000 mg | INTRAVENOUS | Status: DC | PRN

## 2015-01-04 MED ORDER — LABETALOL HCL 100 MG PO TABS
200.0000 mg | ORAL_TABLET | Freq: Once | ORAL | Status: AC
  Administered 2015-01-04: 200 mg via ORAL
  Filled 2015-01-04: qty 2

## 2015-01-04 MED ORDER — LACTATED RINGERS IV SOLN
INTRAVENOUS | Status: DC
  Administered 2015-01-04: 09:00:00 via INTRAVENOUS

## 2015-01-04 MED ORDER — LABETALOL HCL 5 MG/ML IV SOLN
20.0000 mg | Freq: Once | INTRAVENOUS | Status: AC
  Administered 2015-01-04: 20 mg via INTRAVENOUS
  Filled 2015-01-04: qty 4

## 2015-01-04 MED ORDER — LABETALOL HCL 200 MG PO TABS: 400.0000 mg | ORAL_TABLET | Freq: Two times a day (BID) | ORAL | Status: DC

## 2015-01-04 MED ORDER — IBUPROFEN 600 MG PO TABS: 600.0000 mg | ORAL_TABLET | Freq: Four times a day (QID) | ORAL | Status: DC | PRN

## 2015-01-04 NOTE — MAU Note (Signed)
Pt had vaginal delivery on 12-31-14 and a tubal ligation on 01-01-15.  Pt states she is having pain in abd all the way down to her feet.  Pain meds not helping.  Last time she took them was yesterday morning.

## 2015-01-04 NOTE — MAU Provider Note (Signed)
History     CSN: [id number]  Arrival date and time: 01/04/15 1991   First Provider Initiated Contact with Patient 01/04/15 [phone]      Chief Complaint  Patient presents with  . Abdominal Pain   HPI    HPI   Ms.Lindsey [name] is a 39 y.o. female G1P0 presenting to MAU with abdominal pain; generalized pain. She is status post vaginal delivery on 7/24 with bilateral tubal ligation.  She has a history of crack cocaine use, however says she has not used in over a week. She has pain from her abdomen to her feet. The abdominal pain comes and goes; she has had this pain in the past. Nothing makes it better or worse. The patient is asking for something to eat or drink.   OB History    Gravida Para Term Preterm AB TAB SAB Ectopic Multiple Living   5 3 3  0 2 0 2 0 0 3      Past Medical History  Diagnosis Date  . H/O drug abuse     Admits to crack cocaine use yesterday 12/21/2014  . Trichomonas   . GERD (gastroesophageal reflux disease)   . Hypertension   . Pregnancy induced hypertension     Past Surgical History  Procedure Laterality Date  . Cesarean section  03/19/2012    Procedure: CESAREAN SECTION;  Surgeon: 05/19/2012, MD;  Location: [facility];  Service: Obstetrics;  Laterality: N/A;  . Tubal ligation Bilateral 01/01/2015    Procedure: POST PARTUM TUBAL LIGATION;  Surgeon: 01/03/2015, MD;  Location: [facility];  Service: Gynecology;  Laterality: Bilateral;    Family History  Problem Relation Age of Onset  . Anesthesia problems Neg Hx   . Hypotension Neg Hx   . Malignant hyperthermia Neg Hx   . Pseudochol deficiency Neg Hx   . Cancer Mother   . Diabetes Father   . Cancer Maternal Grandmother     History  Substance Use Topics  . Smoking status: Current Some Day Smoker -- 0.01 packs/day    Types: Cigarettes    Last Attempt to Quit: 05/28/2014  . Smokeless tobacco: Never Used  . Alcohol Use: No    Allergies: No Known Allergies  Prescriptions prior to admission   Medication Sig Dispense Refill Last Dose  . oxyCODONE-acetaminophen (PERCOCET/ROXICET) 5-325 MG per tablet Take 1 tablet by mouth every 6 (six) hours as needed for moderate pain or severe pain. 15 tablet 0    Results for orders placed or performed during the hospital encounter of 01/04/15 (from the past 48 hour(s))  Urinalysis, Routine w reflex microscopic (not at [facility])     Status: Abnormal   Collection Time: 01/04/15  7:33 AM  Result Value Ref Range   Color, Urine YELLOW YELLOW   APPearance CLEAR CLEAR   Specific Gravity, Urine 1.020 1.005 - 1.030   pH 6.5 5.0 - 8.0   Glucose, UA NEGATIVE NEGATIVE mg/dL   Hgb urine dipstick LARGE (A) NEGATIVE   Bilirubin Urine NEGATIVE NEGATIVE   Ketones, ur NEGATIVE NEGATIVE mg/dL   Protein, ur NEGATIVE NEGATIVE mg/dL   Urobilinogen, UA 0.2 0.0 - 1.0 mg/dL   Nitrite NEGATIVE NEGATIVE   Leukocytes, UA TRACE (A) NEGATIVE  Urine microscopic-add on     Status: Abnormal   Collection Time: 01/04/15  7:33 AM  Result Value Ref Range   Squamous Epithelial / LPF FEW (A) RARE   WBC, UA 0-2 <3 WBC/hpf   RBC / HPF 11-20 <3  RBC/hpf   Bacteria, UA RARE RARE  Urinalysis, Routine w reflex microscopic (not at [facility])     Status: Abnormal   Collection Time: 01/04/15  7:55 AM  Result Value Ref Range   Color, Urine YELLOW YELLOW   APPearance CLEAR CLEAR   Specific Gravity, Urine 1.010 1.005 - 1.030   pH 6.5 5.0 - 8.0   Glucose, UA NEGATIVE NEGATIVE mg/dL   Hgb urine dipstick TRACE (A) NEGATIVE   Bilirubin Urine NEGATIVE NEGATIVE   Ketones, ur NEGATIVE NEGATIVE mg/dL   Protein, ur NEGATIVE NEGATIVE mg/dL   Urobilinogen, UA 0.2 0.0 - 1.0 mg/dL   Nitrite NEGATIVE NEGATIVE   Leukocytes, UA NEGATIVE NEGATIVE  Protein / creatinine ratio, urine     Status: Abnormal   Collection Time: 01/04/15  7:55 AM  Result Value Ref Range   Creatinine, Urine 51.00 mg/dL   Total Protein, Urine 11 mg/dL    Comment: NO NORMAL RANGE ESTABLISHED FOR THIS TEST   Protein Creatinine  Ratio 0.22 (H) 0.00 - 0.15 mg/mg[Cre]  Urine rapid drug screen (hosp performed)     Status: Abnormal   Collection Time: 01/04/15  7:55 AM  Result Value Ref Range   Opiates NONE DETECTED NONE DETECTED   Cocaine POSITIVE (A) NONE DETECTED   Benzodiazepines NONE DETECTED NONE DETECTED   Amphetamines NONE DETECTED NONE DETECTED   Tetrahydrocannabinol NONE DETECTED NONE DETECTED   Barbiturates NONE DETECTED NONE DETECTED    Comment:        DRUG SCREEN FOR MEDICAL PURPOSES ONLY.  IF CONFIRMATION IS NEEDED FOR ANY PURPOSE, NOTIFY LAB WITHIN 5 DAYS.        LOWEST DETECTABLE LIMITS FOR URINE DRUG SCREEN Drug Class       Cutoff (ng/mL) Amphetamine      1000 Barbiturate      200 Benzodiazepine   357 Tricyclics       017 Opiates          300 Cocaine          300 THC              50   Urine microscopic-add on     Status: None   Collection Time: 01/04/15  7:55 AM  Result Value Ref Range   Squamous Epithelial / LPF RARE RARE   RBC / HPF 0-2 <3 RBC/hpf   Bacteria, UA RARE RARE  CBC     Status: None   Collection Time: 01/04/15  8:00 AM  Result Value Ref Range   WBC 5.5 4.0 - 10.5 K/uL   RBC 4.13 3.87 - 5.11 MIL/uL   Hemoglobin 12.1 12.0 - 15.0 g/dL   HCT 37.3 36.0 - 46.0 %   MCV 90.3 78.0 - 100.0 fL   MCH 29.3 26.0 - 34.0 pg   MCHC 32.4 30.0 - 36.0 g/dL   RDW 14.9 11.5 - 15.5 %   Platelets 278 150 - 400 K/uL  Comprehensive metabolic panel     Status: Abnormal   Collection Time: 01/04/15  8:00 AM  Result Value Ref Range   Sodium 139 135 - 145 mmol/L    Comment: REPEATED TO VERIFY   Potassium 3.8 3.5 - 5.1 mmol/L    Comment: REPEATED TO VERIFY   Chloride 110 101 - 111 mmol/L    Comment: REPEATED TO VERIFY   CO2 28 22 - 32 mmol/L    Comment: REPEATED TO VERIFY   Glucose, Bld 94 65 - 99 mg/dL   BUN 12 6 -  20 mg/dL   Creatinine, Ser 0.88 0.44 - 1.00 mg/dL   Calcium 8.3 (L) 8.9 - 10.3 mg/dL   Total Protein 6.8 6.5 - 8.1 g/dL   Albumin 3.0 (L) 3.5 - 5.0 g/dL   AST 28 15 - 41 U/L    ALT 38 14 - 54 U/L   Alkaline Phosphatase 116 38 - 126 U/L   Total Bilirubin 0.5 0.3 - 1.2 mg/dL   GFR calc non Af Amer >60 >60 mL/min   GFR calc Af Amer >60 >60 mL/min    Comment: (NOTE) The eGFR has been calculated using the CKD EPI equation. This calculation has not been validated in all clinical situations. eGFR's persistently <60 mL/min signify possible Chronic Kidney Disease.    Anion gap 1 (L) 5 - 15    Review of Systems  Constitutional: Negative for fever and chills.  Eyes: Negative for blurred vision.  Respiratory: Negative for shortness of breath.   Cardiovascular: Positive for leg swelling. Negative for chest pain.  Gastrointestinal: Positive for abdominal pain (Generalized ). Negative for nausea and vomiting.  Genitourinary: Negative for dysuria, urgency and frequency.  Neurological: Negative for headaches.   Physical Exam   Blood pressure 144/74, pulse 64, temperature 98 F (36.7 C), temperature source Oral, resp. rate 18, last menstrual period 04/16/2014, SpO2 100 %, not currently breastfeeding.  Physical Exam  Constitutional: She is oriented to person, place, and time. She appears well-developed and well-nourished. No distress.  HENT:  Head: Normocephalic.  Eyes: Pupils are equal, round, and reactive to light.  Neck: Neck supple.  Cardiovascular: Normal rate and normal heart sounds.   Respiratory: Effort normal and breath sounds normal.  GI: Soft. She exhibits no distension and no mass. There is generalized tenderness. There is no rigidity, no rebound and no guarding.  Musculoskeletal: Normal range of motion. She exhibits edema (1+ pitting edema in bilateral lower extremities ).  Neurological: She is alert and oriented to person, place, and time.  Skin: Skin is warm. She is not diaphoretic.  Psychiatric: Her behavior is normal.    MAU Course  Procedures  None  MDM  Toradol 60 mg IM Labetalol 20 mg IV Labetalol 200 mg PO  Patient rates her pain  0/10 at the time of discharge.   Assessment and Plan   A:  1. Postpartum pain   2. Elevated BP   3. Cocaine abuse, continuous use     P:  Discharge home in stable condition  RX: Labetalol 400 mg PO BID  Follow up in the clinic in 2 weeks; message sent to the [place]  Pain management discussed  RX: ibuprofen for 3 days only. Stop using cocaine  Return to MAU if symptoms worsen    Lezlie Lye, NP 01/04/2015 12:01 PM

## 2015-01-11 ENCOUNTER — Emergency Department (HOSPITAL_COMMUNITY)
Admission: EM | Admit: 2015-01-11 | Discharge: 2015-01-11 | Disposition: A | Discharge status: Home / Self Care | Payer: Medicaid Other | Attending: Emergency Medicine | Admitting: Emergency Medicine

## 2015-01-11 ENCOUNTER — Encounter (HOSPITAL_COMMUNITY): Payer: Self-pay

## 2015-01-11 DIAGNOSIS — I10 Essential (primary) hypertension: Secondary | ICD-10-CM | POA: Insufficient documentation

## 2015-01-11 DIAGNOSIS — Z79899 Other long term (current) drug therapy: Secondary | ICD-10-CM | POA: Diagnosis not present

## 2015-01-11 DIAGNOSIS — Z720 Tobacco use: Secondary | ICD-10-CM | POA: Insufficient documentation

## 2015-01-11 DIAGNOSIS — Z9851 Tubal ligation status: Secondary | ICD-10-CM | POA: Insufficient documentation

## 2015-01-11 DIAGNOSIS — R51 Headache: Secondary | ICD-10-CM | POA: Diagnosis present

## 2015-01-11 DIAGNOSIS — Z8619 Personal history of other infectious and parasitic diseases: Secondary | ICD-10-CM | POA: Diagnosis not present

## 2015-01-11 DIAGNOSIS — Z8719 Personal history of other diseases of the digestive system: Secondary | ICD-10-CM | POA: Diagnosis not present

## 2015-01-11 DIAGNOSIS — N939 Abnormal uterine and vaginal bleeding, unspecified: Secondary | ICD-10-CM | POA: Insufficient documentation

## 2015-01-11 MED ORDER — ACETAMINOPHEN 325 MG PO TABS
650.0000 mg | ORAL_TABLET | Freq: Once | ORAL | Status: DC
  Filled 2015-01-11: qty 2

## 2015-01-11 NOTE — Discharge Instructions (Signed)

## 2015-01-11 NOTE — ED Provider Notes (Signed)
CSN: [id number]     Arrival date & time 01/11/15  1549 History   First MD Initiated Contact with Patient 01/11/15 1624     Chief Complaint  Patient presents with  . Hypertension  . Headache  . Vaginal Bleeding     (Consider location/radiation/quality/duration/timing/severity/associated sxs/prior Treatment) HPI   Lindsey [name] is a 39 y.o. female presents for evaluation of headache and high blood pressure. She checked her blood pressure at a pharmacy and it was high. She decided to come here and get it checked. She is a follow-up appointment next week at the [facility]. She was started on labetalol a couple of days ago, for hypertension, postpartum. She states that her vaginal bleeding is almost gone in his chest. Lipid at this time. She denies dizziness or weakness. She's not been nauseated. There are no other known modifying factors.   Past Medical History  Diagnosis Date  . H/O drug abuse     Admits to crack cocaine use yesterday 12/21/2014  . Trichomonas   . GERD (gastroesophageal reflux disease)   . Hypertension   . Pregnancy induced hypertension    Past Surgical History  Procedure Laterality Date  . Cesarean section  03/19/2012    Procedure: CESAREAN SECTION;  Surgeon: Antionette Char, MD;  Location: [facility];  Service: Obstetrics;  Laterality: N/A;  . Tubal ligation Bilateral 01/01/2015    Procedure: POST PARTUM TUBAL LIGATION;  Surgeon: Adam Phenix, MD;  Location: [facility];  Service: Gynecology;  Laterality: Bilateral;   Family History  Problem Relation Age of Onset  . Anesthesia problems Neg Hx   . Hypotension Neg Hx   . Malignant hyperthermia Neg Hx   . Pseudochol deficiency Neg Hx   . Cancer Mother   . Diabetes Father   . Cancer Maternal Grandmother    History  Substance Use Topics  . Smoking status: Current Some Day Smoker -- 0.01 packs/day    Types: Cigarettes    Last Attempt to Quit: 05/28/2014  . Smokeless tobacco: Never Used  . Alcohol Use:  No   OB History    Gravida Para Term Preterm AB TAB SAB Ectopic Multiple Living   5 3 3  0 2 0 2 0 0 3     Review of Systems  All other systems reviewed and are negative.     Allergies  Review of patient's allergies indicates no known allergies.  Home Medications   Prior to Admission medications   Medication Sig Start Date End Date Taking? Authorizing Provider  ibuprofen (ADVIL,MOTRIN) 600 MG tablet Take 1 tablet (600 mg total) by mouth every 6 (six) hours as needed. 01/04/15  Yes Duane Lope, NP  labetalol (NORMODYNE) 200 MG tablet Take 2 tablets (400 mg total) by mouth 2 (two) times daily. 01/04/15  Yes Duane Lope, NP  oxyCODONE-acetaminophen (PERCOCET/ROXICET) 5-325 MG per tablet Take 1 tablet by mouth every 6 (six) hours as needed for moderate pain or severe pain. Patient not taking: Reported on 01/11/2015 01/02/15   Lynnae Prude, MD   BP 154/102 mmHg  Pulse 61  Temp(Src) 98.1 F (36.7 C) (Oral)  Resp 16  SpO2 100% Physical Exam  Constitutional: She is oriented to person, place, and time. She appears well-developed and well-nourished. No distress (She is pleasant and cooperative.).  HENT:  Head: Normocephalic and atraumatic.  Right Ear: External ear normal.  Left Ear: External ear normal.  Eyes: Conjunctivae and EOM are normal. Pupils are equal, round, and reactive  to light.  Neck: Normal range of motion and phonation normal. Neck supple.  Cardiovascular: Normal rate, regular rhythm and normal heart sounds.   Pulmonary/Chest: Effort normal and breath sounds normal. No respiratory distress. She exhibits no bony tenderness.  Abdominal: Soft. There is no tenderness.  Musculoskeletal: Normal range of motion. She exhibits no edema or tenderness.  Neurological: She is alert and oriented to person, place, and time. No cranial nerve deficit or sensory deficit. She exhibits normal muscle tone. Coordination normal.  Skin: Skin is warm, dry and intact.  Psychiatric:  She has a normal mood and affect. Her behavior is normal. Judgment and thought content normal.  Nursing note and vitals reviewed.   ED Course  Procedures (including critical care time)  Medications  acetaminophen (TYLENOL) tablet 650 mg (not administered)    Patient Vitals for the past 24 hrs:  BP Temp Temp src Pulse Resp SpO2  01/11/15 1645 (!) 154/102 mmHg - - - - -  01/11/15 1617 157/87 mmHg 98.1 F (36.7 C) Oral 61 16 100 %    5:15 PM Reevaluation with update and discussion. After initial assessment and treatment, an updated evaluation reveals patient remains comfortable, and blood pressure is stable, minimally elevated. Findings discussed with patient, all questions were answered.Mancel Bale L    Labs Review Labs Reviewed - No data to display  Imaging Review No results found.   EKG Interpretation None      MDM   Final diagnoses:  Essential hypertension    Postpartum hypertension, a nontoxic, patient with history of cocaine abuse. She appears comfortable now, and not intoxicated with cocaine. Blood pressure is minimally elevated. Doubt CVA, ACS or instability.  Nursing Notes Reviewed/ Care Coordinated Applicable Imaging Reviewed Interpretation of Laboratory Data incorporated into ED treatment  The patient appears reasonably screened and/or stabilized for discharge and I doubt any other medical condition or other [facility] requiring further screening, evaluation, or treatment in the ED at this time prior to discharge.  Plan: Home Medications- usual; Home Treatments- rest, avoid cocaine; return here if the recommended treatment, does not improve the symptoms; Recommended follow up- OB/GYN on 01/19/15 as scheduled     Mancel Bale, MD 01/11/15 [phone]

## 2015-01-11 NOTE — ED Notes (Signed)
Walked to room to medicate and discharge pt. Pt was not in room. Per staff pt left the department.

## 2015-01-11 NOTE — Progress Notes (Signed)
[facility] ED CM noted pt with medicaid of [place] coverage but no pcp listed Spoke with pt who confirms no pcp but aware of how to obtain one with insurance coverage customer service number or web site       CM discussed with pt the need to verify with DSS who is pcp is, how to request a change of medicaid pcp and process for changing medicaid from county to county and from state to state Pt voiced understanding   Cm provided pt with DSS info, list of [employer] county MDs Encouraged to review list, call MDs to check if accepting new pts and then call DSS to have MD entered prior to first new medicaid MD visit

## 2015-01-11 NOTE — ED Notes (Signed)
Pt said she was gone. bye

## 2015-01-11 NOTE — ED Notes (Addendum)
Pt c/o hypertension x "a while," vaginal bleeding x 2.5 weeks, and headache starting today.  Sts "I don't feel right and I've been bleeding since I had the baby."  Pt was started on Labetalol  BID x 1 week ago.  Sts she has been taking it as prescribed.  [facility]

## 2015-01-19 ENCOUNTER — Encounter: Payer: Self-pay | Admitting: Obstetrics & Gynecology

## 2015-01-19 ENCOUNTER — Ambulatory Visit (INDEPENDENT_AMBULATORY_CARE_PROVIDER_SITE_OTHER): Payer: Medicaid Other | Admitting: Obstetrics & Gynecology

## 2015-01-19 VITALS — BP 144/92 | HR 62 | Temp 98.2°F | Ht 66.0 in | Wt 130.2 lb

## 2015-01-19 DIAGNOSIS — N390 Urinary tract infection, site not specified: Secondary | ICD-10-CM

## 2015-01-19 LAB — POCT URINALYSIS DIP (DEVICE)
Bilirubin Urine: NEGATIVE
GLUCOSE, UA: NEGATIVE mg/dL
Ketones, ur: NEGATIVE mg/dL
Nitrite: NEGATIVE
Protein, ur: NEGATIVE mg/dL
Specific Gravity, Urine: 1.015 (ref 1.005–1.030)
UROBILINOGEN UA: 0.2 mg/dL (ref 0.0–1.0)
pH: 6 (ref 5.0–8.0)

## 2015-01-19 MED ORDER — SULFAMETHOXAZOLE-TRIMETHOPRIM 800-160 MG PO TABS: 1.0000 | ORAL_TABLET | Freq: Two times a day (BID) | ORAL | Status: DC

## 2015-01-19 NOTE — Progress Notes (Signed)
Subjective:     Patient ID: Lindsey [name], female   DOB: 1975-11-27, 39 y.o.   MRN: [id number]  [id number] No LMP recorded. S/p SVD 7/25, was cocaine intoxicated at delivery. PP BTL was preformed. She notes low abdominal pain and dysuria  Social History   Social History  . Marital Status: Single    Spouse Name: N/A  . Number of Children: N/A  . Years of Education: N/A   Occupational History  . Not on file.   Social History Main Topics  . Smoking status: Current Some Day Smoker -- 0.01 packs/day    Types: Cigarettes    Last Attempt to Quit: 05/28/2014  . Smokeless tobacco: Never Used  . Alcohol Use: No  . Drug Use: Yes    Special: "Crack" cocaine     Comment: last use x 2.5 weeks ago (01/11/15)  . Sexual Activity: Not Currently    Birth Control/ Protection: Surgical   Other Topics Concern  . Not on file   Social History Narrative   Patient is currently homeless and does not have stable housing. She is living in a house alone currently. FOB is not involved. She has two other children.    Past Medical History  Diagnosis Date  . H/O drug abuse     Admits to crack cocaine use yesterday 12/21/2014  . Trichomonas   . GERD (gastroesophageal reflux disease)   . Hypertension   . Pregnancy induced hypertension    Past Surgical History  Procedure Laterality Date  . Cesarean section  03/19/2012    Procedure: CESAREAN SECTION;  Surgeon: Antionette Char, MD;  Location: [facility];  Service: Obstetrics;  Laterality: N/A;  . Tubal ligation Bilateral 01/01/2015    Procedure: POST PARTUM TUBAL LIGATION;  Surgeon: Adam Phenix, MD;  Location: [facility];  Service: Gynecology;  Laterality: Bilateral;      Review of Systems  Genitourinary: Positive for dysuria, urgency, vaginal bleeding and pelvic pain. Negative for vaginal discharge.       Objective:   Physical Exam  Constitutional: She is oriented to person, place, and time. She appears well-developed. No distress.   Pulmonary/Chest: Effort normal.  Abdominal: She exhibits no mass. There is no tenderness.  Neurological: She is alert and oriented to person, place, and time.  Skin: Skin is warm and dry.  Psychiatric: She has a normal mood and affect.  Urine dipstick shows positive for leukocytes.  Micro exam: not done.      Assessment:     Suspect UTI 2 weeks PP     Plan:     Urine culture  Outpatient Encounter Prescriptions as of 01/19/2015  Medication Sig  . ibuprofen (ADVIL,MOTRIN) 600 MG tablet Take 1 tablet (600 mg total) by mouth every 6 (six) hours as needed.  . labetalol (NORMODYNE) 200 MG tablet Take 2 tablets (400 mg total) by mouth 2 (two) times daily.  Marland Kitchen sulfamethoxazole-trimethoprim (BACTRIM DS,SEPTRA DS) 800-160 MG per tablet Take 1 tablet by mouth 2 (two) times daily.  . [DISCONTINUED] oxyCODONE-acetaminophen (PERCOCET/ROXICET) 5-325 MG per tablet Take 1 tablet by mouth every 6 (six) hours as needed for moderate pain or severe pain. (Patient not taking: Reported on 01/11/2015)   No facility-administered encounter medications on file as of 01/19/2015.   RTC 3 weeks for PP exam  Adam Phenix, MD 01/19/2015

## 2015-01-19 NOTE — Patient Instructions (Signed)

## 2015-01-20 LAB — URINE CULTURE: Colony Count: 40000

## 2015-01-29 ENCOUNTER — Encounter (HOSPITAL_COMMUNITY): Payer: Self-pay | Admitting: *Deleted

## 2015-01-29 ENCOUNTER — Emergency Department (HOSPITAL_COMMUNITY)
Admission: EM | Admit: 2015-01-29 | Discharge: 2015-01-29 | Disposition: A | Discharge status: Home / Self Care | Payer: Medicaid Other | Attending: Emergency Medicine | Admitting: Emergency Medicine

## 2015-01-29 DIAGNOSIS — Z8719 Personal history of other diseases of the digestive system: Secondary | ICD-10-CM | POA: Insufficient documentation

## 2015-01-29 DIAGNOSIS — Z8619 Personal history of other infectious and parasitic diseases: Secondary | ICD-10-CM | POA: Diagnosis not present

## 2015-01-29 DIAGNOSIS — Z720 Tobacco use: Secondary | ICD-10-CM | POA: Insufficient documentation

## 2015-01-29 DIAGNOSIS — Z792 Long term (current) use of antibiotics: Secondary | ICD-10-CM | POA: Diagnosis not present

## 2015-01-29 DIAGNOSIS — Z79899 Other long term (current) drug therapy: Secondary | ICD-10-CM | POA: Insufficient documentation

## 2015-01-29 DIAGNOSIS — I10 Essential (primary) hypertension: Secondary | ICD-10-CM | POA: Diagnosis not present

## 2015-01-29 DIAGNOSIS — F1410 Cocaine abuse, uncomplicated: Secondary | ICD-10-CM | POA: Insufficient documentation
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
## 2015-01-29 NOTE — ED Notes (Signed)
MD at bedside. 
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
## 2015-01-29 NOTE — Discharge Instructions (Signed)
°Emergency Department Resource Guide °1) Find a Doctor and Pay Out of Pocket °Although you won't have to find out who is covered by your insurance plan, it is a good idea to ask around and get recommendations. You will then need to call the office and see if the doctor you have chosen will accept you as a new patient and what types of options they offer for patients who are self-pay. Some doctors offer discounts or will set up payment plans for their patients who do not have insurance, but you will need to ask so you aren't surprised when you get to your appointment. ° °2) Contact Your Local Health Department °Not all health departments have doctors that can see patients for sick visits, but many do, so it is worth a call to see if yours does. If you don't know where your local health department is, you can check in your phone book. The CDC also has a tool to help you locate your state's health department, and many state websites also have listings of all of their local health departments. ° °3) Find a Walk-in Clinic °If your illness is not likely to be very severe or complicated, you may want to try a walk in clinic. These are popping up all over the country in pharmacies, drugstores, and shopping centers. They're usually staffed by nurse practitioners or physician assistants that have been trained to treat common illnesses and complaints. They're usually fairly quick and inexpensive. However, if you have serious medical issues or chronic medical problems, these are probably not your best option. ° °No Primary Care Doctor: °- Call [facility] at  832-8000 - they can help you locate a primary care doctor that  accepts your insurance, provides certain services, etc. °- Physician Referral Service- 1-800-533-3463 ° °Chronic Pain Problems: °Organization         Address  Phone   Notes  °[facility]  (336) 297-2271 Patients need to be referred by their primary care doctor.  ° °Medication  Assistance: °Organization         Address  Phone   Notes  °[place] [facility] [address] °[place], [place] [address] (336) 641-8030 --Must be a resident of [place] °-- Must have NO insurance coverage whatsoever (no Medicaid/ Medicare, etc.) °-- The pt. MUST have a primary care doctor that directs their care regularly and follows them in the community °  °[facility]  (866) 331-1348   °[employer]  (888) 892-1162   ° °Agencies that provide inexpensive medical care: °Organization         Address  Phone   Notes  °[facility]  (336) 832-8035   °[facility]    (336) 832-7272   °[facility] [address] °[place], [place] [address] (336) 832-4777   °[facility] [address], °[place] (336) 271-4999   °[facility]    (336) 373-0678   °[facility]    (336) 272-1050   °[facility] ° [address], [place] Phone:  (336) 832-4444, Fax:  (336) 832-4440 Hours of Operation:  9 am - 6 pm, M-F.  Also accepts Medicaid/Medicare and self-pay.  °[facility] ° [address],  Phone: (336) 832-3150, Fax: (336) 832-3151. Hours of Operation:  8:30 am - 5:30 pm, M-F.  Also accepts Medicaid and self-pay.  °[facility] [place] [address], [place] Phone: (336) 878-6027   °[facility] [address], [place], [place] (336)723-1848, Ext. 123 Mondays & Thursdays: 7-9 AM.  First 15 patients are seen on a first come, first serve basis. °  ° °Medicaid-accepting [place] Providers: ° °Organization         Address  Phone   Notes  °[facility] [address], [place] (336) 641-2100 Also accepts self-pay patients.  °[facility] [address], [place] ° (336) 856-9996   °[facility] [address], [place]  (336) 288-8857   °[facility] [address] [place] [address], [place] (336) 299-7000   °Veita Bland [address], [place]  ° (336) 373-1557 Only accepts [place] Access Medicaid patients after they have their name applied to their card.  ° °Self-Pay (no insurance) in [place]: ° °Organization         Address  Phone   Notes  °Sickle Cell Patients, [facility] [address], [place] (336) 832-1970   °[facility] [address], [place] (336) 832-4400   °[facility] ° [address], [place] (336) 992-4800   °[facility]/Dr. Osei-Bonsu ° [address] [place] [address], [place] or [address], [place] (336) 841-8500 Phone number for both [place] and [place] locations is the same.  °[facility] [address], [place] (336) 299-0000   °[facility] [place] [place] [address] [place] [address], [place] or [address] (336) 852-7530 °(336) 878-2260   °[facility] [address], [place] (336) 350-1642, phone; (336) 294-5005, fax Sees patients 1st and 3rd Saturday of every month.  Must not qualify for public or private insurance (i.e. Medicaid, Medicare, [place] Health Choice, Veterans' Benefits) • Household income should be no more than 200% of the poverty level •The clinic cannot treat you if you are pregnant or think you are pregnant • Sexually transmitted diseases are not treated at the clinic.  ° ° °Dental Care: °Organization         Address  Phone  Notes  °[place] [facility] [address], [place] (336) 641-6152 Accepts children up to age 21 who are enrolled in Medicaid or [place] Health Choice; pregnant women with a Medicaid card; and children who have applied for Medicaid or [place] Health Choice, but were declined, whose parents can pay a reduced fee at time of service.  °[place]  [facility] [place]  [address], [place] (336) 641-7733 Accepts children up to age 21 who are enrolled in Medicaid or [place] Health Choice; pregnant women with a Medicaid card; and children who have applied for Medicaid or [place] Health Choice, but were declined, whose parents can pay a reduced fee at time of service.  °[facility] ° [address],  (336) 641-4533 Patients are seen by appointment only. Walk-ins are not accepted. [facility] will see patients 18 years of age and older. °Monday - Tuesday (8am-5pm) °Most Wednesdays (8:30-5pm) °$30 per visit, cash only  °[facility] ° [address], [place] (336) 641-4533 Patients are seen by appointment only. Walk-ins are not accepted. [facility] will see patients 18 years of age and older. °One   Wednesday Evening (Monthly: Volunteer Based).  $30 per visit, cash only  °[facility]  (919) 537-3737 for adults; Children under age 4, call Graduate Pediatric Dentistry at (919) [phone]. Children aged 4-14, please call (919) [phone] to request a pediatric application. ° Dental services are provided in all areas of dental care including fillings, crowns and bridges, complete and partial dentures, implants, gum treatment, root canals, and extractions. Preventive care is also provided. Treatment is provided to both adults and children. °Patients are selected via a lottery and there is often a waiting list. °  °[facility] [address], °[place] ° (336) 763-8833 [email or web address] °  °[facility] [address], [place], [place] (336)723-1848, Ext. 123 Second and Fourth Thursday of each month, opens at 6:30 AM; Clinic ends at 9 AM.  Patients are seen on a first-come first-served basis, and a limited number are seen during each clinic.  ° °[facility] ° [address], [place], [place] (336) 723-7904    Eligibility Requirements °You must have lived in [place], [place], or [place] counties for at least the last three months. °  You cannot be eligible for state or federal sponsored healthcare insurance, including Veterans Administration, Medicaid, or Medicare. °  You generally cannot be eligible for healthcare insurance through your employer.  °  How to apply: °Eligibility screenings are held every Tuesday and Wednesday afternoon from 1:00 pm until 4:00 pm. You do not need an appointment for the interview!  °[facility] [address], [place], [place] 336-631-2330   °[facility]  336-342-8273   °[facility]  336-703-3100   °[facility]  336-570-6415   ° °Behavioral Health Resources in the Community: °Intensive Outpatient Programs °Organization         Address  Phone  Notes  °[facility] [address], [place], [place] 336-878-6098   °[facility] [address], [place], [place] 336-832-9800   °[facility] [address], [place], [place] ° 336-882-2125   °[facility] [address],  °[place], [place] 1-800-853-5163 or 336-641-4981   °Substance Abuse Resources °Organization         Address  Phone  Notes  °[facility]  336-882-2125   °[facility]  336-784-9470   °[facility]  336-285-9073   °[facility]  336-845-3988   °Residential & Outpatient Substance Abuse Program  1-800-659-3381   °Psychological Services °Organization         Address  Phone  Notes  °[facility]  336- 832-9600   °[facility]  336- 378-7881   °[facility] [address] [place] 1-800-853-5163 or 336-641-4981   ° °Mobile Crisis Teams °Organization         Address  Phone  Notes  °[facility], Mobile Crisis Care Unit  1-877-626-1772   °Assertive °[facility] ° [address]  [place], [place] 336-834-9664   °Sharon DeEsch [address] °[place]  336-554-5454   ° °Self-Help/Support Groups °Organization         Address  Phone             Notes  °[facility] - variety of support groups  336- 373-1402 Call for more information  °Narcotics Anonymous (NA), [facility] [address], °[place]   2 meetings at this location  ° °  Residential Treatment Programs °Organization         Address  Phone  Notes  °[facility] [address],    °[place]  1-866-801-8205   °[facility] ° [address], [place], [place] 704-293-8524   °[facility] [address], [place] 336-845-3988 Admissions: 8am-3pm M-F  °[facility] [address]    °[place], [place] 336-841-1104   °[facility] [address], [place], [place] 336-379-7146   °[facility] [address]  °[place], [place] 336-285-9073   °[facility] Intensive Outpatient [address], [place], [place] 336-852-3033   °[facility] [address]  °[place], [place] 1-877-615-2722 or 336-784-9470   °[facility] [address] [place], [place] 336-227-7417 Accepts Medicaid  °[facility] [address]  °[place] 1-800-659-3381 Substance Abuse/Addiction Treatment  ° °[place] Behavioral Health Resources °Organization         Address  Phone  Notes  °[facility]  (888) 581-9988   °Julie Brannon, PhD [address] [place], [place]   (336) 349-5553 or (336) 951-0000   °[facility]   [address] °[place], [place] (336) 349-4454   °[facility] [address], [place], [place] (336) 342-8316 Insurance/Medicaid/sponsorship through [facility]  °[facility] [address]                                    [place], [place] (336) 342-8316 Therapy/tele-psych/case    °[facility] [address]  ° [place], [place] (336) 349-2233    °Dr. Arfeen  (336) 349-4544   °[facility] [place]  [employer] [place] [facility] 1) [address], [place] °2) [address], [place] °3)  [address], [place] (336) 349-3220 °(336) 342-7768 ° °(336) 342-8140   °[place] [facility] (336) 342-1394 or (336) 342-3537 (After Hours)    ° ° °

## 2015-01-29 NOTE — ED Notes (Signed)
No answer x2 

## 2015-02-07 ENCOUNTER — Encounter: Payer: Self-pay | Admitting: *Deleted

## 2015-02-14 ENCOUNTER — Ambulatory Visit: Payer: Medicaid Other | Admitting: Obstetrics and Gynecology

## 2015-02-14 ENCOUNTER — Ambulatory Visit: Payer: Medicaid Other | Admitting: Obstetrics & Gynecology

## 2015-05-31 ENCOUNTER — Encounter (HOSPITAL_BASED_OUTPATIENT_CLINIC_OR_DEPARTMENT_OTHER): Payer: Self-pay

## 2015-05-31 ENCOUNTER — Emergency Department (HOSPITAL_BASED_OUTPATIENT_CLINIC_OR_DEPARTMENT_OTHER)
Admission: EM | Admit: 2015-05-31 | Discharge: 2015-05-31 | Disposition: A | Discharge status: Home / Self Care | Payer: Medicaid Other | Attending: Emergency Medicine | Admitting: Emergency Medicine

## 2015-05-31 DIAGNOSIS — A5901 Trichomonal vulvovaginitis: Secondary | ICD-10-CM | POA: Insufficient documentation

## 2015-05-31 DIAGNOSIS — Z9889 Other specified postprocedural states: Secondary | ICD-10-CM | POA: Insufficient documentation

## 2015-05-31 DIAGNOSIS — N760 Acute vaginitis: Secondary | ICD-10-CM | POA: Insufficient documentation

## 2015-05-31 DIAGNOSIS — Z9851 Tubal ligation status: Secondary | ICD-10-CM | POA: Insufficient documentation

## 2015-05-31 DIAGNOSIS — A599 Trichomoniasis, unspecified: Secondary | ICD-10-CM

## 2015-05-31 DIAGNOSIS — B9689 Other specified bacterial agents as the cause of diseases classified elsewhere: Secondary | ICD-10-CM

## 2015-05-31 DIAGNOSIS — Z79899 Other long term (current) drug therapy: Secondary | ICD-10-CM | POA: Insufficient documentation

## 2015-05-31 DIAGNOSIS — Z87891 Personal history of nicotine dependence: Secondary | ICD-10-CM | POA: Insufficient documentation

## 2015-05-31 DIAGNOSIS — Z8719 Personal history of other diseases of the digestive system: Secondary | ICD-10-CM | POA: Insufficient documentation

## 2015-05-31 LAB — PREGNANCY, URINE: PREG TEST UR: NEGATIVE

## 2015-05-31 LAB — WET PREP, GENITAL
SPERM: NONE SEEN
YEAST WET PREP: NONE SEEN

## 2015-05-31 LAB — URINALYSIS, ROUTINE W REFLEX MICROSCOPIC
BILIRUBIN URINE: NEGATIVE
Glucose, UA: NEGATIVE mg/dL
HGB URINE DIPSTICK: NEGATIVE
Ketones, ur: NEGATIVE mg/dL
Nitrite: NEGATIVE
Protein, ur: 30 mg/dL — AB
Specific Gravity, Urine: 1.024 (ref 1.005–1.030)
pH: 8.5 — ABNORMAL HIGH (ref 5.0–8.0)

## 2015-05-31 LAB — URINE MICROSCOPIC-ADD ON: RBC / HPF: NONE SEEN RBC/hpf (ref 0–5)

## 2015-05-31 MED ORDER — AZITHROMYCIN 250 MG PO TABS
1000.0000 mg | ORAL_TABLET | Freq: Once | ORAL | Status: AC
  Administered 2015-05-31: 1000 mg via ORAL
  Filled 2015-05-31: qty 4

## 2015-05-31 MED ORDER — ONDANSETRON 4 MG PO TBDP
4.0000 mg | ORAL_TABLET | Freq: Once | ORAL | Status: AC
  Administered 2015-05-31: 4 mg via ORAL
  Filled 2015-05-31: qty 1

## 2015-05-31 MED ORDER — METRONIDAZOLE 500 MG PO TABS
2000.0000 mg | ORAL_TABLET | Freq: Once | ORAL | Status: AC
  Administered 2015-05-31: 2000 mg via ORAL
  Filled 2015-05-31: qty 4

## 2015-05-31 MED ORDER — HYDROCHLOROTHIAZIDE 25 MG PO TABS: 25.0000 mg | ORAL_TABLET | Freq: Every day | ORAL | Status: DC

## 2015-05-31 MED ORDER — CEFTRIAXONE SODIUM 250 MG IJ SOLR
250.0000 mg | Freq: Once | INTRAMUSCULAR | Status: AC
  Administered 2015-05-31: 250 mg via INTRAMUSCULAR
  Filled 2015-05-31: qty 250

## 2015-05-31 NOTE — ED Provider Notes (Signed)
CSN: [id number]     Arrival date & time 05/31/15  1511 History   First MD Initiated Contact with Patient 05/31/15 1539     Chief Complaint  Patient presents with  . Vaginal Discharge     (Consider location/radiation/quality/duration/timing/severity/associated sxs/prior Treatment) HPI   Patient presents to the emergency department for evaluation of vaginal discharge. She reports her babies father told her recently that he was having some burning with urination. She has had some clear vaginal discharge. Otherwise she denies having any other symptoms at this time. The patient says she uses protection when chest sex with men but that she mainly has sex with women. She denies having any vaginal bleeding, nausea, vomiting, diarrhea, headaches, abdominal pain, back pain, chest pain. [facility] and has been for the last 2 months for treatment of cocaine abuse.  Past Medical History  Diagnosis Date  . H/O drug abuse     Admits to crack cocaine use yesterday 12/21/2014  . Trichomonas   . GERD (gastroesophageal reflux disease)   . Hypertension   . Pregnancy induced hypertension    Past Surgical History  Procedure Laterality Date  . Cesarean section  03/19/2012    Procedure: CESAREAN SECTION;  Surgeon: Antionette CharLisa Jackson-Moore, MD;  Location: [facility];  Service: Obstetrics;  Laterality: N/A;  . Tubal ligation Bilateral 01/01/2015    Procedure: POST PARTUM TUBAL LIGATION;  Surgeon: Adam PhenixJames G Arnold, MD;  Location: [facility];  Service: Gynecology;  Laterality: Bilateral;   Family History  Problem Relation Age of Onset  . Anesthesia problems Neg Hx   . Hypotension Neg Hx   . Malignant hyperthermia Neg Hx   . Pseudochol deficiency Neg Hx   . Cancer Mother   . Diabetes Father   . Cancer Maternal Grandmother    Social History  Substance Use Topics  . Smoking status: Former Smoker -- 0.01 packs/day    Types: Cigarettes  . Smokeless tobacco: Never Used  . Alcohol Use: No   OB  History    Gravida Para Term Preterm AB TAB SAB Ectopic Multiple Living   5 3 3  0 2 0 2 0 0 3     Review of Systems  Review of Systems All other systems negative except as documented in the HPI. All pertinent positives and negatives as reviewed in the HPI.   Allergies  Review of patient's allergies indicates no known allergies.  Home Medications   Prior to Admission medications   Not on File   BP 182/101 mmHg  Pulse 78  Temp(Src) 98.7 F (37.1 C) (Oral)  Resp 75  SpO2 100%  LMP 05/21/2015 Physical Exam  Constitutional: She appears well-developed and well-nourished. No distress.  HENT:  Head: Normocephalic and atraumatic.  Eyes: Pupils are equal, round, and reactive to light.  Neck: Normal range of motion. Neck supple.  Cardiovascular: Normal rate and regular rhythm.   Pulmonary/Chest: Effort normal.  Abdominal: Soft.  Genitourinary: Uterus normal. There is no rash or tenderness on the right labia. There is no rash or tenderness on the left labia. Cervix exhibits no motion tenderness, no discharge and no friability. Right adnexum displays no mass, no tenderness and no fullness. Left adnexum displays no mass, no tenderness and no fullness. No erythema, tenderness or bleeding in the vagina. Vaginal discharge (white) found.  Neurological: She is alert.  Skin: Skin is warm and dry.  Nursing note and vitals reviewed.   ED Course  Procedures (including critical care time)  Labs Review Labs Reviewed  WET PREP, GENITAL - Abnormal; Notable for the following:    Trich, Wet Prep PRESENT (*)    Clue Cells Wet Prep HPF POC PRESENT (*)    WBC, Wet Prep HPF POC MANY (*)    All other components within normal limits  URINALYSIS, ROUTINE W REFLEX MICROSCOPIC (NOT AT [facility]) - Abnormal; Notable for the following:    APPearance TURBID (*)    pH 8.5 (*)    Protein, ur 30 (*)    Leukocytes, UA TRACE (*)    All other components within normal limits  URINE MICROSCOPIC-ADD ON - Abnormal;  Notable for the following:    Squamous Epithelial / LPF 0-5 (*)    Bacteria, UA MANY (*)    All other components within normal limits  PREGNANCY, URINE  GC/CHLAMYDIA PROBE AMP ([place]) NOT AT [facility]    Imaging Review No results found. I have personally reviewed and evaluated these images and lab results as part of my medical decision-making.   EKG Interpretation None      MDM   Final diagnoses:  Trichimoniasis  Bacterial vaginosis    Medications  azithromycin (ZITHROMAX) tablet 1,000 mg (1,000 mg Oral Given 05/31/15 1644)  cefTRIAXone (ROCEPHIN) injection 250 mg (250 mg Intramuscular Given 05/31/15 1644)  metroNIDAZOLE (FLAGYL) tablet 2,000 mg (2,000 mg Oral Given 05/31/15 1644)  ondansetron (ZOFRAN-ODT) disintegrating tablet 4 mg (4 mg Oral Given 05/31/15 1644)    Patient's urine sent for culture although I think it's contaminated. Negative urine pregnancy test. She has positive track, clue cells and WBCs.   I feel the patient has had an appropriate workup for their chief complaint at this time and likelihood of emergent condition existing is low. Discussed s/sx that warrant return to the ED.  Filed Vitals:   05/31/15 1528  BP: 182/101  Pulse: 78  Temp: 98.7 F (37.1 C)  Resp: [address], PA-C 05/31/15 1647  Rolland Porter, MD 06/12/15 [phone]

## 2015-05-31 NOTE — Discharge Instructions (Signed)
Bacterial Vaginosis Bacterial vaginosis is a vaginal infection that occurs when the normal balance of bacteria in the vagina is disrupted. It results from an overgrowth of certain bacteria. This is the most common vaginal infection in women of childbearing age. Treatment is important to prevent complications, especially in pregnant women, as it can cause a premature delivery. CAUSES  Bacterial vaginosis is caused by an increase in harmful bacteria that are normally present in smaller amounts in the vagina. Several different kinds of bacteria can cause bacterial vaginosis. However, the reason that the condition develops is not fully understood. RISK FACTORS Certain activities or behaviors can put you at an increased risk of developing bacterial vaginosis, including:  Having a new sex partner or multiple sex partners.  Douching.  Using an intrauterine device (IUD) for contraception. Women do not get bacterial vaginosis from toilet seats, bedding, swimming pools, or contact with objects around them. SIGNS AND SYMPTOMS  Some women with bacterial vaginosis have no signs or symptoms. Common symptoms include:  Grey vaginal discharge.  A fishlike odor with discharge, especially after sexual intercourse.  Itching or burning of the vagina and vulva.  Burning or pain with urination. DIAGNOSIS  Your health care provider will take a medical history and examine the vagina for signs of bacterial vaginosis. A sample of vaginal fluid may be taken. Your health care provider will look at this sample under a microscope to check for bacteria and abnormal cells. A vaginal pH test may also be done.  TREATMENT  Bacterial vaginosis may be treated with antibiotic medicines. These may be given in the form of a pill or a vaginal cream. A second round of antibiotics may be prescribed if the condition comes back after treatment. Because bacterial vaginosis increases your risk for sexually transmitted diseases, getting  treated can help reduce your risk for chlamydia, gonorrhea, HIV, and herpes. HOME CARE INSTRUCTIONS   Only take over-the-counter or prescription medicines as directed by your health care provider.  If antibiotic medicine was prescribed, take it as directed. Make sure you finish it even if you start to feel better.  Tell all sexual partners that you have a vaginal infection. They should see their health care provider and be treated if they have problems, such as a mild rash or itching.  During treatment, it is important that you follow these instructions:  Avoid sexual activity or use condoms correctly.  Do not douche.  Avoid alcohol as directed by your health care provider.  Avoid breastfeeding as directed by your health care provider. SEEK MEDICAL CARE IF:   Your symptoms are not improving after 3 days of treatment.  You have increased discharge or pain.  You have a fever. MAKE SURE YOU:   Understand these instructions.  Will watch your condition.  Will get help right away if you are not doing well or get worse. FOR MORE INFORMATION  Centers for Disease Control and Prevention, Division of STD Prevention: [employer] American Sexual Health Association (ASHA): www.ashastd.org    This information is not intended to replace advice given to you by your health care provider. Make sure you discuss any questions you have with your health care provider.   Document Released: 05/26/2005 Document Revised: 06/16/2014 Document Reviewed: 01/05/2013 Elsevier Interactive Patient Education 2016 [employer].  Trichomoniasis Trichomoniasis is an infection caused by an organism called Trichomonas. The infection can affect both women and men. In women, the outer female genitalia and the vagina are affected. In men, the penis is mainly  affected, but the prostate and other reproductive organs can also be involved. Trichomoniasis is a sexually transmitted infection (STI) and is most often  passed to another person through sexual contact.  RISK FACTORS  Having unprotected sexual intercourse.  Having sexual intercourse with an infected partner. SIGNS AND SYMPTOMS  Symptoms of trichomoniasis in women include:  Abnormal gray-green frothy vaginal discharge.  Itching and irritation of the vagina.  Itching and irritation of the area outside the vagina. Symptoms of trichomoniasis in men include:   Penile discharge with or without pain.  Pain during urination. This results from inflammation of the urethra. DIAGNOSIS  Trichomoniasis may be found during a Pap test or physical exam. Your health care provider may use one of the following methods to help diagnose this infection:  Testing the pH of the vagina with a test tape.  Using a vaginal swab test that checks for the Trichomonas organism. A test is available that provides results within a few minutes.  Examining a urine sample.  Testing vaginal secretions. Your health care provider may test you for other STIs, including HIV. TREATMENT   You may be given medicine to fight the infection. Women should inform their health care provider if they could be or are pregnant. Some medicines used to treat the infection should not be taken during pregnancy.  Your health care provider may recommend over-the-counter medicines or creams to decrease itching or irritation.  Your sexual partner will need to be treated if infected.  Your health care provider may test you for infection again 3 months after treatment. HOME CARE INSTRUCTIONS   Take medicines only as directed by your health care provider.  Take over-the-counter medicine for itching or irritation as directed by your health care provider.  Do not have sexual intercourse while you have the infection.  Women should not douche or wear tampons while they have the infection.  Discuss your infection with your partner. Your partner may have gotten the infection from you, or you  may have gotten it from your partner.  Have your sex partner get examined and treated if necessary.  Practice safe, informed, and protected sex.  See your health care provider for other STI testing. SEEK MEDICAL CARE IF:   You still have symptoms after you finish your medicine.  You develop abdominal pain.  You have pain when you urinate.  You have bleeding after sexual intercourse.  You develop a rash.  Your medicine makes you sick or makes you throw up (vomit). MAKE SURE YOU:  Understand these instructions.  Will watch your condition.  Will get help right away if you are not doing well or get worse.   This information is not intended to replace advice given to you by your health care provider. Make sure you discuss any questions you have with your health care provider.   Document Released: 11/19/2000 Document Revised: 06/16/2014 Document Reviewed: 03/07/2013 Elsevier Interactive Patient Education 2016 [employer]. Safe Sex Safe sex is about reducing the risk of giving or getting a sexually transmitted disease (STD). STDs are spread through sexual contact involving the genitals, mouth, or rectum. Some STDs can be cured and others cannot. Safe sex can also prevent unintended pregnancies.  WHAT ARE SOME SAFE SEX PRACTICES?  Limit your sexual activity to only one partner who is having sex with only you.  Talk to your partner about his or her past partners, past STDs, and drug use.  Use a condom every time you have sexual intercourse. This  includes vaginal, oral, and anal sexual activity. Both females and males should wear condoms during oral sex. Only use latex or polyurethane condoms and water-based lubricants. Using petroleum-based lubricants or oils to lubricate a condom will weaken the condom and increase the chance that it will break. The condom should be in place from the beginning to the end of sexual activity. Wearing a condom reduces, but does not completely  eliminate, your risk of getting or giving an STD. STDs can be spread by contact with infected body fluids and skin.  Get vaccinated for hepatitis B and HPV.  Avoid alcohol and recreational drugs, which can affect your judgment. You may forget to use a condom or participate in high-risk sex.  For females, avoid douching after sexual intercourse. Douching can spread an infection farther into the reproductive tract.  Check your body for signs of sores, blisters, rashes, or unusual discharge. See your health care provider if you notice any of these signs.  Avoid sexual contact if you have symptoms of an infection or are being treated for an STD. If you or your partner has herpes, avoid sexual contact when blisters are present. Use condoms at all other times.  If you are at risk of being infected with HIV, it is recommended that you take a prescription medicine daily to prevent HIV infection. This is called pre-exposure prophylaxis (PrEP). You are considered at risk if:  You are a man who has sex with other men (MSM).  You are a heterosexual man or woman who is sexually active with more than one partner.  You take drugs by injection.  You are sexually active with a partner who has HIV.  Talk with your health care provider about whether you are at high risk of being infected with HIV. If you choose to begin PrEP, you should first be tested for HIV. You should then be tested every 3 months for as long as you are taking PrEP.  See your health care provider for regular screenings, exams, and tests for other STDs. Before having sex with a new partner, each of you should be screened for STDs and should talk about the results with each other. WHAT ARE THE BENEFITS OF SAFE SEX?   There is less chance of getting or giving an STD.  You can prevent unwanted or unintended pregnancies.  By discussing safe sex concerns with your partner, you may increase feelings of intimacy, comfort, trust, and honesty  between the two of you.   This information is not intended to replace advice given to you by your health care provider. Make sure you discuss any questions you have with your health care provider.   Document Released: 07/03/2004 Document Revised: 06/16/2014 Document Reviewed: 11/17/2011 Elsevier Interactive Patient Education [employer]2016 Elsevier Inc.

## 2015-05-31 NOTE — ED Notes (Signed)
Vaginal d/c x 2 months-at [facility] x 2 months for cocaine use

## 2015-05-31 NOTE — ED Notes (Signed)
[facility] called for pick up 

## 2015-06-01 LAB — GC/CHLAMYDIA PROBE AMP (~~LOC~~) NOT AT ARMC
Chlamydia: NEGATIVE
Neisseria Gonorrhea: NEGATIVE

## 2016-01-02 ENCOUNTER — Encounter (HOSPITAL_BASED_OUTPATIENT_CLINIC_OR_DEPARTMENT_OTHER): Payer: Self-pay | Admitting: *Deleted

## 2016-01-02 ENCOUNTER — Emergency Department (HOSPITAL_BASED_OUTPATIENT_CLINIC_OR_DEPARTMENT_OTHER): Payer: Medicaid Other

## 2016-01-02 ENCOUNTER — Emergency Department (HOSPITAL_BASED_OUTPATIENT_CLINIC_OR_DEPARTMENT_OTHER)
Admission: EM | Admit: 2016-01-02 | Discharge: 2016-01-02 | Disposition: A | Discharge status: Home / Self Care | Payer: Medicaid Other | Attending: Emergency Medicine | Admitting: Emergency Medicine

## 2016-01-02 DIAGNOSIS — F1410 Cocaine abuse, uncomplicated: Secondary | ICD-10-CM

## 2016-01-02 DIAGNOSIS — Y999 Unspecified external cause status: Secondary | ICD-10-CM | POA: Insufficient documentation

## 2016-01-02 DIAGNOSIS — X58XXXA Exposure to other specified factors, initial encounter: Secondary | ICD-10-CM | POA: Insufficient documentation

## 2016-01-02 DIAGNOSIS — I10 Essential (primary) hypertension: Secondary | ICD-10-CM

## 2016-01-02 DIAGNOSIS — N390 Urinary tract infection, site not specified: Secondary | ICD-10-CM

## 2016-01-02 DIAGNOSIS — Z5181 Encounter for therapeutic drug level monitoring: Secondary | ICD-10-CM | POA: Insufficient documentation

## 2016-01-02 DIAGNOSIS — Y939 Activity, unspecified: Secondary | ICD-10-CM | POA: Insufficient documentation

## 2016-01-02 DIAGNOSIS — Y929 Unspecified place or not applicable: Secondary | ICD-10-CM | POA: Insufficient documentation

## 2016-01-02 DIAGNOSIS — Z87891 Personal history of nicotine dependence: Secondary | ICD-10-CM | POA: Insufficient documentation

## 2016-01-02 DIAGNOSIS — S76011A Strain of muscle, fascia and tendon of right hip, initial encounter: Secondary | ICD-10-CM

## 2016-01-02 LAB — URINALYSIS, ROUTINE W REFLEX MICROSCOPIC
Bilirubin Urine: NEGATIVE
Glucose, UA: NEGATIVE mg/dL
Ketones, ur: NEGATIVE mg/dL
Nitrite: NEGATIVE
Protein, ur: NEGATIVE mg/dL
Specific Gravity, Urine: 1.02 (ref 1.005–1.030)
pH: 5.5 (ref 5.0–8.0)

## 2016-01-02 LAB — BASIC METABOLIC PANEL
Anion gap: 7 (ref 5–15)
BUN: 16 mg/dL (ref 6–20)
CO2: 27 mmol/L (ref 22–32)
Calcium: 8.7 mg/dL — ABNORMAL LOW (ref 8.9–10.3)
Chloride: 105 mmol/L (ref 101–111)
Creatinine, Ser: 0.89 mg/dL (ref 0.44–1.00)
GFR calc Af Amer: >60 mL/min (ref >60)
GFR calc non Af Amer: >60 mL/min (ref >60)
Glucose, Bld: 108 mg/dL — ABNORMAL HIGH (ref 65–99)
Potassium: 3.7 mmol/L (ref 3.5–5.1)
Sodium: 139 mmol/L (ref 135–145)

## 2016-01-02 LAB — RAPID URINE DRUG SCREEN, HOSP PERFORMED
Amphetamines: NOT DETECTED
BARBITURATES: NOT DETECTED
BENZODIAZEPINES: NOT DETECTED
COCAINE: POSITIVE — AB
OPIATES: NOT DETECTED
TETRAHYDROCANNABINOL: POSITIVE — AB

## 2016-01-02 LAB — TROPONIN I: Troponin I: <0.03 ng/mL (ref <0.03)

## 2016-01-02 LAB — CBC WITH DIFFERENTIAL/PLATELET
Basophils Absolute: 0 10*3/uL (ref 0.0–0.1)
Basophils Relative: 1 %
Eosinophils Absolute: 0.1 10*3/uL (ref 0.0–0.7)
Eosinophils Relative: 2 %
HCT: 38.3 % (ref 36.0–46.0)
Hemoglobin: 12.5 g/dL (ref 12.0–15.0)
Lymphocytes Relative: 55 %
Lymphs Abs: 3.7 10*3/uL (ref 0.7–4.0)
MCH: 29.2 pg (ref 26.0–34.0)
MCHC: 32.6 g/dL (ref 30.0–36.0)
MCV: 89.5 fL (ref 78.0–100.0)
Monocytes Absolute: 0.6 10*3/uL (ref 0.1–1.0)
Monocytes Relative: 9 %
Neutro Abs: 2.2 10*3/uL (ref 1.7–7.7)
Neutrophils Relative %: 33 %
Platelets: 311 10*3/uL (ref 150–400)
RBC: 4.28 MIL/uL (ref 3.87–5.11)
RDW: 13.4 % (ref 11.5–15.5)
WBC: 6.7 10*3/uL (ref 4.0–10.5)

## 2016-01-02 LAB — URINE MICROSCOPIC-ADD ON

## 2016-01-02 LAB — PREGNANCY, URINE: Preg Test, Ur: NEGATIVE

## 2016-01-02 MED ORDER — SULFAMETHOXAZOLE-TRIMETHOPRIM 800-160 MG PO TABS
1.0000 | ORAL_TABLET | Freq: Once | ORAL | Status: AC
  Administered 2016-01-02: 1 via ORAL
  Filled 2016-01-02: qty 1

## 2016-01-02 MED ORDER — HYDROCHLOROTHIAZIDE 25 MG PO TABS: 25.0000 mg | ORAL_TABLET | Freq: Every day | ORAL | 0 refills | Status: DC

## 2016-01-02 MED ORDER — HYDRALAZINE HCL 20 MG/ML IJ SOLN: 10.0000 mg | Freq: Once | INTRAMUSCULAR | Status: DC

## 2016-01-02 MED ORDER — SULFAMETHOXAZOLE-TRIMETHOPRIM 800-160 MG PO TABS: 1.0000 | ORAL_TABLET | Freq: Two times a day (BID) | ORAL | 0 refills | Status: AC

## 2016-01-02 MED ORDER — HYDRALAZINE HCL 20 MG/ML IJ SOLN
20.0000 mg | Freq: Once | INTRAMUSCULAR | Status: DC
  Filled 2016-01-02: qty 1

## 2016-01-02 MED ORDER — HYDROCHLOROTHIAZIDE 25 MG PO TABS
25.0000 mg | ORAL_TABLET | Freq: Once | ORAL | Status: AC
  Administered 2016-01-02: 25 mg via ORAL
  Filled 2016-01-02: qty 1

## 2016-01-02 NOTE — ED Provider Notes (Signed)
[facility]   CSN: [id number] Arrival date & time: 01/02/16  1023  First Provider Contact:  First MD Initiated Contact with Patient 01/02/16 1031        History   Chief Complaint Chief Complaint  Patient presents with  . Hypertension    HPI Lindsey [name] is a 40 y.o. female.  Pt has a hx of crack cocaine abuse and is at [facility].  Her BP was noted to be high, so she was sent here.  Pt is supposed to be on hctz 25 mg daily, but has not taken it in 6 months.  The pt also c/o chronic right hip pain since she was pregnant with her daughter who is 66 year old.  Pt denies any cp or sob.   The history is provided by the patient.  Hypertension  This is a chronic problem.    Past Medical History:  Diagnosis Date  . GERD (gastroesophageal reflux disease)   . H/O drug abuse    Admits to crack cocaine use yesterday 12/21/2014  . Hypertension   . Pregnancy induced hypertension   . Trichomonas     Patient Active Problem List   Diagnosis Date Noted  . Vaginal delivery 12/31/2014  . Active labor 12/31/2014  . Cocaine abuse affecting pregnancy in third trimester 12/22/2014  . AMA (advanced maternal age) multigravida 35+ 08/23/2014  . Anxiety 08/10/2014  . Depressed 08/10/2014  . Hx of drug abuse 08/10/2014  . History of cesarean delivery 07/26/2014  . Supervision of normal subsequent pregnancy 07/11/2014    Past Surgical History:  Procedure Laterality Date  . CESAREAN SECTION  03/19/2012   Procedure: CESAREAN SECTION;  Surgeon: Antionette Char, MD;  Location: [facility];  Service: Obstetrics;  Laterality: N/A;  . TUBAL LIGATION Bilateral 01/01/2015   Procedure: POST PARTUM TUBAL LIGATION;  Surgeon: Adam Phenix, MD;  Location: [facility];  Service: Gynecology;  Laterality: Bilateral;    OB History    Gravida Para Term Preterm AB Living   5 3 3  0 2 3   SAB TAB Ectopic Multiple Live Births   2 0 0 0         Home Medications    Prior to  Admission medications   Medication Sig Start Date End Date Taking? Authorizing Provider  hydrochlorothiazide (HYDRODIURIL) 25 MG tablet Take 1 tablet (25 mg total) by mouth daily. 05/31/15   Tiffany Neva Seat, PA-C  hydrochlorothiazide (HYDRODIURIL) 25 MG tablet Take 1 tablet (25 mg total) by mouth daily. 01/02/16   Jacalyn Lefevre, MD  sulfamethoxazole-trimethoprim (BACTRIM DS,SEPTRA DS) 800-160 MG tablet Take 1 tablet by mouth 2 (two) times daily. 01/02/16 01/09/16  Jacalyn Lefevre, MD    Family History Family History  Problem Relation Age of Onset  . Cancer Mother   . Diabetes Father   . Cancer Maternal Grandmother   . Anesthesia problems Neg Hx   . Hypotension Neg Hx   . Malignant hyperthermia Neg Hx   . Pseudochol deficiency Neg Hx     Social History Social History  Substance Use Topics  . Smoking status: Former Smoker    Packs/day: 0.01    Types: Cigarettes  . Smokeless tobacco: Never Used  . Alcohol use No     Allergies   Review of patient's allergies indicates no known allergies.   Review of Systems Review of Systems  Musculoskeletal:       Right hip pain  All other systems reviewed and are negative.  Physical Exam Updated Vital Signs BP 131/84   Pulse (!) 59   Temp 97.8 F (36.6 C) (Oral)   Resp 13   Ht  (1.6 m)   Wt 105 lb (47.6 kg)   LMP 12/29/2015   SpO2 100%   BMI 18.60 kg/m   Physical Exam  Constitutional: She is oriented to person, place, and time. She appears well-developed and well-nourished.  HENT:  Head: Normocephalic and atraumatic.  Right Ear: External ear normal.  Left Ear: External ear normal.  Nose: Nose normal.  Mouth/Throat: Oropharynx is clear and moist.  Eyes: Conjunctivae and EOM are normal. Pupils are equal, round, and reactive to light.  Neck: Normal range of motion. Neck supple.  Cardiovascular: Normal rate, regular rhythm, normal heart sounds and intact distal pulses.   Pulmonary/Chest: Effort normal and breath sounds  normal.  Abdominal: Soft. Bowel sounds are normal.  Musculoskeletal: Normal range of motion.  Neurological: She is alert and oriented to person, place, and time.  Skin: Skin is warm and dry.  Psychiatric: She has a normal mood and affect. Her behavior is normal. Judgment and thought content normal.  Nursing note and vitals reviewed.    ED Treatments / Results  Labs (all labs ordered are listed, but only abnormal results are displayed) Labs Reviewed  BASIC METABOLIC PANEL - Abnormal; Notable for the following:       Result Value   Glucose, Bld 108 (*)    Calcium 8.7 (*)    All other components within normal limits  URINALYSIS, ROUTINE W REFLEX MICROSCOPIC (NOT AT [facility]) - Abnormal; Notable for the following:    APPearance CLOUDY (*)    Hgb urine dipstick TRACE (*)    Leukocytes, UA LARGE (*)    All other components within normal limits  URINE RAPID DRUG SCREEN, HOSP PERFORMED - Abnormal; Notable for the following:    Cocaine POSITIVE (*)    Tetrahydrocannabinol POSITIVE (*)    All other components within normal limits  URINE MICROSCOPIC-ADD ON - Abnormal; Notable for the following:    Squamous Epithelial / LPF 0-5 (*)    Bacteria, UA FEW (*)    All other components within normal limits  CBC WITH DIFFERENTIAL/PLATELET  TROPONIN I  PREGNANCY, URINE    EKG  EKG Interpretation  Date/Time:  Wednesday January 02 2016 10:51:14 EDT Ventricular Rate:  65 PR Interval:    QRS Duration: 110 QT Interval:  473 QTC Calculation: 492 R Axis:   22 Text Interpretation:  Sinus rhythm RSR' in V1 or V2, probably normal variant Borderline prolonged QT interval Baseline wander in lead(s) I V2 Confirmed by [facility] MD, Phuc Kluttz ([id number]) on 01/02/2016 10:55:27 AM Also confirmed by Particia Nearing MD, Ravina Milner ([id number]), editor [place], Jola Babinski [phone])  on 01/02/2016 11:59:56 AM       Radiology Dg Hip Unilat With Pelvis 2-3 Views Right  Result Date: 01/02/2016 CLINICAL DATA:  Right hip pain for 1 week. No  known injury. Initial encounter. EXAM: DG HIP (WITH OR WITHOUT PELVIS) 2-3V RIGHT COMPARISON:  None. FINDINGS: There is no evidence of hip fracture or dislocation. There is no evidence of arthropathy or other focal bone abnormality. Tubal ligation clips are noted. IMPRESSION: Negative exam. Electronically Signed   By: Drusilla Kanner M.D.   On: 01/02/2016 11:45   Procedures Procedures (including critical care time)  Medications Ordered in ED Medications  hydrALAZINE (APRESOLINE) injection 20 mg (20 mg Intramuscular Not Given 01/02/16 1237)  sulfamethoxazole-trimethoprim (BACTRIM DS,SEPTRA DS) 800-160 MG  per tablet 1 tablet (not administered)  hydrochlorothiazide (HYDRODIURIL) tablet 25 mg (25 mg Oral Given 01/02/16 1053)     Initial Impression / Assessment and Plan / ED Course  I have reviewed the triage vital signs and the nursing notes.  Pertinent labs & imaging results that were available during my care of the patient were reviewed by me and considered in my medical decision making (see chart for details).  Clinical Course  BP has improved with 1 dose of HCTZ.  Pt is in treatment for cocaine abuse, so she is trying to stop. Hip seems to be a chronic issue.  Pt given number to [facility]. Pt instr to return if worse.     Final Clinical Impressions(s) / ED Diagnoses   Final diagnoses:  Essential hypertension  Cocaine abuse  UTI (lower urinary tract infection)  Hip strain, right, initial encounter    New Prescriptions New Prescriptions   HYDROCHLOROTHIAZIDE (HYDRODIURIL) 25 MG TABLET    Take 1 tablet (25 mg total) by mouth daily.   SULFAMETHOXAZOLE-TRIMETHOPRIM (BACTRIM DS,SEPTRA DS) 800-160 MG TABLET    Take 1 tablet by mouth 2 (two) times daily.     Jacalyn Lefevre, MD 01/02/16 1323

## 2016-01-02 NOTE — ED Notes (Signed)
Ambulated to BR for specimen. No difficulty.

## 2016-01-02 NOTE — ED Notes (Signed)
Went to room to give Hydralazine and pt was resting quietly with eyes closed. BP 131/84. EDP advised. Verbal order given to hold med for now.

## 2016-01-02 NOTE — ED Notes (Signed)
Pt is at [facility] and has been clean x 1 day. States she has been out of her BP med x 6 months. Also c/o right hip pain since having her baby which makes her BP go up even more.

## 2016-01-02 NOTE — ED Notes (Signed)
Patient transported to X-ray 

## 2016-01-02 NOTE — ED Notes (Signed)
Pt given a snack 

## 2016-01-02 NOTE — ED Notes (Signed)
Pt given d/c instructions as per chart. Rx x 2. Verbalizes understanding. No questions. 

## 2016-01-22 ENCOUNTER — Encounter (HOSPITAL_COMMUNITY): Payer: Self-pay

## 2016-01-22 ENCOUNTER — Emergency Department (HOSPITAL_COMMUNITY)
Admission: EM | Admit: 2016-01-22 | Discharge: 2016-01-22 | Disposition: A | Discharge status: Home / Self Care | Payer: Self-pay | Attending: Emergency Medicine | Admitting: Emergency Medicine

## 2016-01-22 ENCOUNTER — Emergency Department (HOSPITAL_COMMUNITY): Payer: Self-pay

## 2016-01-22 DIAGNOSIS — I10 Essential (primary) hypertension: Secondary | ICD-10-CM | POA: Insufficient documentation

## 2016-01-22 DIAGNOSIS — F17210 Nicotine dependence, cigarettes, uncomplicated: Secondary | ICD-10-CM | POA: Insufficient documentation

## 2016-01-22 DIAGNOSIS — M5431 Sciatica, right side: Secondary | ICD-10-CM | POA: Insufficient documentation

## 2016-01-22 DIAGNOSIS — Z79899 Other long term (current) drug therapy: Secondary | ICD-10-CM | POA: Insufficient documentation

## 2016-01-22 MED ORDER — IBUPROFEN 800 MG PO TABS
800.0000 mg | ORAL_TABLET | Freq: Once | ORAL | Status: AC
  Administered 2016-01-22: 800 mg via ORAL
  Filled 2016-01-22: qty 1

## 2016-01-22 MED ORDER — DICLOFENAC SODIUM 50 MG PO TBEC: 50.0000 mg | DELAYED_RELEASE_TABLET | Freq: Two times a day (BID) | ORAL | 0 refills | Status: DC

## 2016-01-22 MED ORDER — METHOCARBAMOL 500 MG PO TABS
500.0000 mg | ORAL_TABLET | Freq: Once | ORAL | Status: AC
  Administered 2016-01-22: 500 mg via ORAL
  Filled 2016-01-22: qty 1

## 2016-01-22 MED ORDER — METHOCARBAMOL 500 MG PO TABS: 500.0000 mg | ORAL_TABLET | Freq: Two times a day (BID) | ORAL | 0 refills | Status: DC

## 2016-01-22 NOTE — ED Provider Notes (Signed)
[facility] DEPT Provider Note   CSN: [id number] Arrival date & time: 01/22/16  1116  By signing my name below, I, Aggie MoatsJenny Song, attest that this documentation has been prepared under the direction and in the presence of Langston MaskerKaren Chao Blazejewski, [place]PA-C. Electronically signed by: Aggie MoatsJenny Song, ED Scribe. 01/22/16. 12:38 PM.    History   Chief Complaint Chief Complaint  Patient presents with  . Hip Pain    BEHIND RIGHT HIP    The history is provided by the patient. No language interpreter was used.   HPI Comments:  Lindsey [name] is a 40 y.o. female who presents to the Emergency Department complaining of gradually worsening, moderate right hip pain, which started 4 months ago. Pain is characterized as a sharp, stabbing pain that radiates posteriorly down right leg. Associated symptoms include numbness and tingling in right foot. Pain exacerbated with movement. Denies fever, swelling or decreased strength. Pt has a history of infection in bilateral legs following the birth of daughter caused by fluid. No recent injury, no hormonal medication and no recent long travels.    Past Medical History:  Diagnosis Date  . GERD (gastroesophageal reflux disease)   . H/O drug abuse    Admits to crack cocaine use yesterday 12/21/2014  . Hypertension   . Pregnancy induced hypertension   . Trichomonas     Patient Active Problem List   Diagnosis Date Noted  . Vaginal delivery 12/31/2014  . Active labor 12/31/2014  . Cocaine abuse affecting pregnancy in third trimester 12/22/2014  . AMA (advanced maternal age) multigravida 35+ 08/23/2014  . Anxiety 08/10/2014  . Depressed 08/10/2014  . Hx of drug abuse 08/10/2014  . History of cesarean delivery 07/26/2014  . Supervision of normal subsequent pregnancy 07/11/2014    Past Surgical History:  Procedure Laterality Date  . CESAREAN SECTION  03/19/2012   Procedure: CESAREAN SECTION;  Surgeon: Antionette CharLisa Jackson-Moore, MD;  Location: [facility];  Service:  Obstetrics;  Laterality: N/A;  . TUBAL LIGATION Bilateral 01/01/2015   Procedure: POST PARTUM TUBAL LIGATION;  Surgeon: Adam PhenixJames G Arnold, MD;  Location: [facility];  Service: Gynecology;  Laterality: Bilateral;    OB History    Gravida Para Term Preterm AB Living   6 3 3  0 2 3   SAB TAB Ectopic Multiple Live Births   2 0 0 0 3       Home Medications    Prior to Admission medications   Medication Sig Start Date End Date Taking? Authorizing Provider  hydrochlorothiazide (HYDRODIURIL) 25 MG tablet Take 1 tablet (25 mg total) by mouth daily. 05/31/15   Tiffany Neva SeatGreene, PA-C  hydrochlorothiazide (HYDRODIURIL) 25 MG tablet Take 1 tablet (25 mg total) by mouth daily. 01/02/16   Jacalyn LefevreJulie Haviland, MD    Family History Family History  Problem Relation Age of Onset  . Cancer Mother   . Diabetes Father   . Cancer Maternal Grandmother   . Anesthesia problems Neg Hx   . Hypotension Neg Hx   . Malignant hyperthermia Neg Hx   . Pseudochol deficiency Neg Hx     Social History Social History  Substance Use Topics  . Smoking status: Current Some Day Smoker    Packs/day: 0.01    Types: Cigarettes  . Smokeless tobacco: Never Used  . Alcohol use No     Allergies   Review of patient's allergies indicates no known allergies.   Review of Systems Review of Systems  Constitutional: Negative for fever.  Musculoskeletal: Positive for arthralgias  and myalgias. Negative for joint swelling.  Neurological: Positive for numbness. Negative for weakness.  All other systems reviewed and are negative.    Physical Exam Updated Vital Signs BP 113/64 (BP Location: Right Arm)   Pulse 88   Temp 97.6 F (36.4 C) (Oral)   Resp 20   Ht 5\' 3"  (1.6 m)   Wt 115 lb (52.2 kg)   LMP 12/29/2015   SpO2 100%   BMI 20.37 kg/m   Physical Exam  Constitutional: She appears well-developed and well-nourished.  HENT:  Head: Normocephalic and atraumatic.  Eyes: Conjunctivae are normal. Right eye exhibits no  discharge. Left eye exhibits no discharge.  Pulmonary/Chest: Effort normal. No respiratory distress.  Musculoskeletal: She exhibits tenderness.  Tender sciatic notch. L, S spine non-tender.   Neurological: She is alert. Coordination normal.  Skin: Skin is warm and dry. No rash noted. She is not diaphoretic. No erythema.  Psychiatric: She has a normal mood and affect.  Nursing note and vitals reviewed.    ED Treatments / Results  DIAGNOSTIC STUDIES:  Oxygen Saturation is 100% on room air, normal by my interpretation.    COORDINATION OF CARE:  12:35 PM Discussed treatment plan with pt at bedside, which includes X-ray of right hip and pelvis and anti-inflammatory and muscle relaxer for pain, and pt agreed to plan.  Labs (all labs ordered are listed, but only abnormal results are displayed) Labs Reviewed - No data to display  EKG  EKG Interpretation None       Radiology No results found.  Procedures Procedures (including critical care time)  Medications Ordered in ED Medications - No data to display   Initial Impression / Assessment and Plan / ED Course  I have reviewed the triage vital signs and the nursing notes.  Pertinent labs & imaging results that were available during my care of the patient were reviewed by me and considered in my medical decision making (see chart for details).  Clinical Course    Pt counseled, probably sciatica.  I will treat with non narcotics. Pt is in a treatment program  Final Clinical Impressions(s) / ED Diagnoses   Final diagnoses:  Sciatica of right side    New Prescriptions Discharge Medication List as of 01/22/2016  1:51 PM    START taking these medications   Details  diclofenac (VOLTAREN) 50 MG EC tablet Take 1 tablet (50 mg total) by mouth 2 (two) times daily., Starting Tue 01/22/2016, Print    methocarbamol (ROBAXIN) 500 MG tablet Take 1 tablet (500 mg total) by mouth 2 (two) times daily., Starting Tue 01/22/2016, Print        An After Visit Summary was printed and given to the patient.    Lonia SkinnerLeslie K EricsonSofia, PA-C 01/22/16 1441    Alvira MondayErin Schlossman, MD 01/24/16 [phone]

## 2016-01-22 NOTE — ED Notes (Signed)
PT DISCHARGED. INSTRUCTIONS AND PRESCRIPTIONS GIVEN. AAOX4. PT IN NO APPARENT DISTRESS. THE OPPORTUNITY TO ASK QUESTIONS WAS PROVIDED. 

## 2016-01-22 NOTE — ED Triage Notes (Signed)
PT C/O PAIN BEHIND THE RIGHT HIP RADIATING DOWN THE RIGHT LEG X4 MONTHS. DENIES INJURY.

## 2016-02-05 ENCOUNTER — Ambulatory Visit (HOSPITAL_COMMUNITY)
Admission: EM | Admit: 2016-02-05 | Discharge: 2016-02-05 | Disposition: A | Discharge status: Home / Self Care | Payer: Medicaid Other

## 2016-02-05 ENCOUNTER — Encounter (HOSPITAL_COMMUNITY): Payer: Self-pay | Admitting: Emergency Medicine

## 2016-02-05 DIAGNOSIS — M5431 Sciatica, right side: Secondary | ICD-10-CM

## 2016-02-05 DIAGNOSIS — I10 Essential (primary) hypertension: Secondary | ICD-10-CM

## 2016-02-05 MED ORDER — HYDROCHLOROTHIAZIDE 25 MG PO TABS
25.0000 mg | ORAL_TABLET | Freq: Every day | ORAL | 0 refills | Status: DC
Start: 2016-02-05 — End: 2021-10-17

## 2016-02-05 MED ORDER — KETOROLAC TROMETHAMINE 60 MG/2ML IM SOLN
60.0000 mg | Freq: Once | INTRAMUSCULAR | Status: AC
  Administered 2016-02-05: 60 mg via INTRAMUSCULAR

## 2016-02-05 MED ORDER — KETOROLAC TROMETHAMINE 60 MG/2ML IM SOLN
INTRAMUSCULAR | Status: AC
  Filled 2016-02-05: qty 2

## 2016-02-05 MED ORDER — PREDNISONE 10 MG (21) PO TBPK: 10.0000 mg | ORAL_TABLET | Freq: Every day | ORAL | 0 refills | Status: DC

## 2016-02-05 NOTE — ED Provider Notes (Signed)
CSN: [id number]     Arrival date & time 02/05/16  1323 History   None    Chief Complaint  Patient presents with  . Medication Refill  . Hip Pain   (Consider location/radiation/quality/duration/timing/severity/associated sxs/prior Treatment) Patient c/o right hip pain x 2 months and needs refill HCTZ.   The history is provided by the patient.  Medication Refill  Medications/supplies requested:  HCTZ Reason for request:  Clinic/provider not available Medications taken before: yes - see home medications   Patient has complete original prescription information: yes   Hip Pain     Past Medical History:  Diagnosis Date  . GERD (gastroesophageal reflux disease)   . H/O drug abuse    Admits to crack cocaine use yesterday 12/21/2014  . Hypertension   . Pregnancy induced hypertension   . Trichomonas    Past Surgical History:  Procedure Laterality Date  . CESAREAN SECTION  03/19/2012   Procedure: CESAREAN SECTION;  Surgeon: Antionette Char, MD;  Location: [facility];  Service: Obstetrics;  Laterality: N/A;  . TUBAL LIGATION Bilateral 01/01/2015   Procedure: POST PARTUM TUBAL LIGATION;  Surgeon: Adam Phenix, MD;  Location: [facility];  Service: Gynecology;  Laterality: Bilateral;   Family History  Problem Relation Age of Onset  . Cancer Mother   . Diabetes Father   . Cancer Maternal Grandmother   . Anesthesia problems Neg Hx   . Hypotension Neg Hx   . Malignant hyperthermia Neg Hx   . Pseudochol deficiency Neg Hx    Social History  Substance Use Topics  . Smoking status: Current Some Day Smoker    Packs/day: 0.01    Types: Cigarettes  . Smokeless tobacco: Never Used  . Alcohol use No   OB History    Gravida Para Term Preterm AB Living   6 3 3  0 2 3   SAB TAB Ectopic Multiple Live Births   2 0 0 0 3     Review of Systems  Constitutional: Negative.   HENT: Negative.   Eyes: Negative.   Respiratory: Negative.   Cardiovascular: Negative.   Gastrointestinal: Negative.    Endocrine: Negative.   Genitourinary: Negative.   Skin: Negative.   Allergic/Immunologic: Negative.   Neurological: Negative.   Hematological: Negative.   Psychiatric/Behavioral: Negative.     Allergies  Review of patient's allergies indicates no known allergies.  Home Medications   Prior to Admission medications   Medication Sig Start Date End Date Taking? Authorizing Provider  gabapentin (NEURONTIN) 300 MG capsule Take 300 mg by mouth 2 (two) times daily.   Yes Historical Provider, MD  hydrochlorothiazide (HYDRODIURIL) 25 MG tablet Take 1 tablet (25 mg total) by mouth daily. 05/31/15  Yes Tiffany Neva Seat, PA-C  QUEtiapine (SEROQUEL) 300 MG tablet Take 300 mg by mouth at bedtime.   Yes Historical Provider, MD  diclofenac (VOLTAREN) 50 MG EC tablet Take 1 tablet (50 mg total) by mouth 2 (two) times daily. 01/22/16   Elson Areas, PA-C  hydrochlorothiazide (HYDRODIURIL) 25 MG tablet Take 1 tablet (25 mg total) by mouth daily. 01/02/16   Jacalyn Lefevre, MD  methocarbamol (ROBAXIN) 500 MG tablet Take 1 tablet (500 mg total) by mouth 2 (two) times daily. 01/22/16   Elson Areas, PA-C   Meds Ordered and Administered this Visit  Medications - No data to display  BP 146/88 (BP Location: Left Arm)   Pulse 80   Temp 98.2 F (36.8 C) (Oral)   Resp 18   LMP  01/08/2016 (Exact Date)   SpO2 100%  No data found.   Physical Exam  Constitutional: She appears well-developed and well-nourished.  HENT:  Head: Normocephalic.  Eyes: EOM are normal. Pupils are equal, round, and reactive to light.  Neck: Normal range of motion. Neck supple.  Cardiovascular: Normal rate, regular rhythm and normal heart sounds.   Pulmonary/Chest: Effort normal and breath sounds normal.  Abdominal: Soft. Bowel sounds are normal.  Musculoskeletal: She exhibits tenderness.  TTP right sciatic notch and right thigh and buttock.  TTP right LS spine  Nursing note and vitals reviewed.   Urgent Care Course    Clinical Course    Procedures (including critical care time)  Labs Review Labs Reviewed - No data to display  Imaging Review No results found.   Visual Acuity Review  Right Eye Distance:   Left Eye Distance:   Bilateral Distance:    Right Eye Near:   Left Eye Near:    Bilateral Near:         MDM  HTN - HCTZ 25mg  one po qd #30 Right Sciatica - Sterapred DS dosepack #42 10mg  Toradol 60mg  IM     Deatra CanterWilliam J Iktan Aikman, FNP 02/05/16 1457    Deatra CanterWilliam J Kobey Sides, FNP 02/05/16 1459

## 2016-02-05 NOTE — ED Triage Notes (Signed)
[facility] a [facility] of chronic right hip pain x 2 months and needing her HCTZ refilled.

## 2016-02-07 ENCOUNTER — Emergency Department (HOSPITAL_COMMUNITY)
Admission: EM | Admit: 2016-02-07 | Discharge: 2016-02-07 | Disposition: A | Discharge status: Home / Self Care | Payer: Medicaid Other | Attending: Emergency Medicine | Admitting: Emergency Medicine

## 2016-02-07 ENCOUNTER — Encounter (HOSPITAL_COMMUNITY): Payer: Self-pay | Admitting: *Deleted

## 2016-02-07 DIAGNOSIS — R739 Hyperglycemia, unspecified: Secondary | ICD-10-CM

## 2016-02-07 DIAGNOSIS — F17210 Nicotine dependence, cigarettes, uncomplicated: Secondary | ICD-10-CM | POA: Insufficient documentation

## 2016-02-07 DIAGNOSIS — I10 Essential (primary) hypertension: Secondary | ICD-10-CM | POA: Insufficient documentation

## 2016-02-07 DIAGNOSIS — Z79899 Other long term (current) drug therapy: Secondary | ICD-10-CM | POA: Insufficient documentation

## 2016-02-07 LAB — CBC WITH DIFFERENTIAL/PLATELET
BASOS ABS: 0 10*3/uL (ref 0.0–0.1)
BASOS PCT: 0 %
Eosinophils Absolute: 0 10*3/uL (ref 0.0–0.7)
Eosinophils Relative: 0 %
HEMATOCRIT: 37.9 % (ref 36.0–46.0)
Hemoglobin: 13 g/dL (ref 12.0–15.0)
Lymphocytes Relative: 17 %
Lymphs Abs: 1.7 10*3/uL (ref 0.7–4.0)
MCH: 29.5 pg (ref 26.0–34.0)
MCHC: 34.3 g/dL (ref 30.0–36.0)
MCV: 86.1 fL (ref 78.0–100.0)
MONO ABS: 0.3 10*3/uL (ref 0.1–1.0)
Monocytes Relative: 3 %
NEUTROS ABS: 7.7 10*3/uL (ref 1.7–7.7)
Neutrophils Relative %: 80 %
PLATELETS: 236 10*3/uL (ref 150–400)
RBC: 4.4 MIL/uL (ref 3.87–5.11)
RDW: 14.7 % (ref 11.5–15.5)
WBC: 9.6 10*3/uL (ref 4.0–10.5)

## 2016-02-07 LAB — URINALYSIS, ROUTINE W REFLEX MICROSCOPIC
Bilirubin Urine: NEGATIVE
Glucose, UA: NEGATIVE mg/dL
Hgb urine dipstick: NEGATIVE
KETONES UR: NEGATIVE mg/dL
LEUKOCYTES UA: NEGATIVE
NITRITE: NEGATIVE
PH: 8 (ref 5.0–8.0)
PROTEIN: NEGATIVE mg/dL
Specific Gravity, Urine: 1.021 (ref 1.005–1.030)

## 2016-02-07 LAB — PREGNANCY, URINE: Preg Test, Ur: NEGATIVE

## 2016-02-07 LAB — RAPID URINE DRUG SCREEN, HOSP PERFORMED
Amphetamines: NOT DETECTED
Barbiturates: NOT DETECTED
Benzodiazepines: NOT DETECTED
COCAINE: NOT DETECTED
OPIATES: NOT DETECTED
Tetrahydrocannabinol: NOT DETECTED

## 2016-02-07 LAB — BASIC METABOLIC PANEL
ANION GAP: 8 (ref 5–15)
BUN: 15 mg/dL (ref 6–20)
CO2: 25 mmol/L (ref 22–32)
Calcium: 9.7 mg/dL (ref 8.9–10.3)
Chloride: 107 mmol/L (ref 101–111)
Creatinine, Ser: 0.88 mg/dL (ref 0.44–1.00)
Glucose, Bld: 147 mg/dL — ABNORMAL HIGH (ref 65–99)
POTASSIUM: 3.9 mmol/L (ref 3.5–5.1)
SODIUM: 140 mmol/L (ref 135–145)

## 2016-02-07 LAB — TROPONIN I

## 2016-02-07 MED ORDER — CLONIDINE HCL 0.1 MG PO TABS: 0.1000 mg | ORAL_TABLET | Freq: Every day | ORAL | 0 refills | Status: DC

## 2016-02-07 MED ORDER — CLONIDINE HCL 0.1 MG PO TABS
0.1000 mg | ORAL_TABLET | Freq: Once | ORAL | Status: AC
  Administered 2016-02-07: 0.1 mg via ORAL
  Filled 2016-02-07: qty 1

## 2016-02-07 NOTE — ED Provider Notes (Signed)
[facility] DEPT Provider Note   CSN: [id number] Arrival date & time: 02/07/16  1859     History   Chief Complaint Chief Complaint  Patient presents with  . Hypertension  . Headache  . Dizziness    HPI Lindsey [name] is a 40 y.o. female.  Pt has a hx of htn and presents to the ed today with htn despite taking her hctz.  The pt said that she ws diagnosed with htn in June when she went to detox at [facility].  She said that she was given hctz while there.  When she got out, they did not give her a rx.  The pt said that she does not have a doctor.  She did go to urgent care on 8/29 to get a rx for her hctz which she was given.  The Pt said that she has been taking her meds, but her bp is still high and she has a h/a.      Past Medical History:  Diagnosis Date  . GERD (gastroesophageal reflux disease)   . H/O drug abuse    Admits to crack cocaine use yesterday 12/21/2014  . Hypertension   . Pregnancy induced hypertension   . Trichomonas     Patient Active Problem List   Diagnosis Date Noted  . Vaginal delivery 12/31/2014  . Active labor 12/31/2014  . Cocaine abuse affecting pregnancy in third trimester 12/22/2014  . AMA (advanced maternal age) multigravida 35+ 08/23/2014  . Anxiety 08/10/2014  . Depressed 08/10/2014  . Hx of drug abuse 08/10/2014  . History of cesarean delivery 07/26/2014  . Supervision of normal subsequent pregnancy 07/11/2014    Past Surgical History:  Procedure Laterality Date  . CESAREAN SECTION  03/19/2012   Procedure: CESAREAN SECTION;  Surgeon: Antionette Char, MD;  Location: [facility];  Service: Obstetrics;  Laterality: N/A;  . TUBAL LIGATION Bilateral 01/01/2015   Procedure: POST PARTUM TUBAL LIGATION;  Surgeon: Adam Phenix, MD;  Location: [facility];  Service: Gynecology;  Laterality: Bilateral;    OB History    Gravida Para Term Preterm AB Living   6 3 3  0 2 3   SAB TAB Ectopic Multiple Live Births   2 0 0 0 3       Home  Medications    Prior to Admission medications   Medication Sig Start Date End Date Taking? Authorizing Provider  busPIRone (BUSPAR) 5 MG tablet Take 5 mg by mouth 3 (three) times daily.   Yes Historical Provider, MD  hydrochlorothiazide (HYDRODIURIL) 25 MG tablet Take 1 tablet (25 mg total) by mouth daily. 01/02/16  Yes Jacalyn Lefevre, MD  naltrexone (DEPADE) 50 MG tablet Take 25 mg by mouth at bedtime.   Yes Historical Provider, MD  predniSONE (STERAPRED UNI-PAK 21 TAB) 10 MG (21) TBPK tablet Take 1 tablet (10 mg total) by mouth daily. Take 6 tabs by mouth daily  for 2 days, then 5 tabs for 2 days, then 4 tabs for 2 days, then 3 tabs for 2 days, 2 tabs for 2 days, then 1 tab by mouth daily for 2 days 02/05/16  Yes Deatra Canter, FNP  QUEtiapine (SEROQUEL) 300 MG tablet Take 300 mg by mouth at bedtime.   Yes Historical Provider, MD  sertraline (ZOLOFT) 25 MG tablet Take 25 mg by mouth daily.   Yes Historical Provider, MD  cloNIDine (CATAPRES) 0.1 MG tablet Take 1 tablet (0.1 mg total) by mouth daily. 02/07/16   Jacalyn Lefevre, MD  diclofenac (VOLTAREN) 50 MG EC tablet Take 1 tablet (50 mg total) by mouth 2 (two) times daily. Patient not taking: Reported on 02/07/2016 01/22/16   Elson AreasLeslie K Sofia, PA-C  hydrochlorothiazide (HYDRODIURIL) 25 MG tablet Take 1 tablet (25 mg total) by mouth daily. Patient not taking: Reported on 02/07/2016 02/05/16   Deatra CanterWilliam J Oxford, FNP  methocarbamol (ROBAXIN) 500 MG tablet Take 1 tablet (500 mg total) by mouth 2 (two) times daily. Patient not taking: Reported on 02/07/2016 01/22/16   Elson AreasLeslie K Sofia, PA-C    Family History Family History  Problem Relation Age of Onset  . Cancer Mother   . Diabetes Father   . Cancer Maternal Grandmother   . Anesthesia problems Neg Hx   . Hypotension Neg Hx   . Malignant hyperthermia Neg Hx   . Pseudochol deficiency Neg Hx     Social History Social History  Substance Use Topics  . Smoking status: Current Some Day Smoker     Packs/day: 0.01    Types: Cigarettes  . Smokeless tobacco: Never Used  . Alcohol use No     Allergies   Review of patient's allergies indicates no known allergies.   Review of Systems Review of Systems  Neurological: Positive for headaches.  All other systems reviewed and are negative.    Physical Exam Updated Vital Signs BP 164/100   Pulse 95   Temp 97.9 F (36.6 C) (Oral)   Resp 23   LMP 01/08/2016 (Exact Date)   SpO2 100%   Physical Exam  Constitutional: She is oriented to person, place, and time. She appears well-developed and well-nourished.  HENT:  Head: Normocephalic and atraumatic.  Right Ear: External ear normal.  Left Ear: External ear normal.  Nose: Nose normal.  Mouth/Throat: Oropharynx is clear and moist.  Eyes: Conjunctivae and EOM are normal. Pupils are equal, round, and reactive to light.  Neck: Normal range of motion. Neck supple.  Cardiovascular: Normal rate, regular rhythm, normal heart sounds and intact distal pulses.   Pulmonary/Chest: Effort normal and breath sounds normal.  Abdominal: Soft. Bowel sounds are normal.  Musculoskeletal: Normal range of motion.  Neurological: She is alert and oriented to person, place, and time.  Skin: Skin is warm and dry.  Psychiatric: She has a normal mood and affect. Her behavior is normal. Judgment and thought content normal.  Nursing note and vitals reviewed.    ED Treatments / Results  Labs (all labs ordered are listed, but only abnormal results are displayed) Labs Reviewed  BASIC METABOLIC PANEL - Abnormal; Notable for the following:       Result Value   Glucose, Bld 147 (*)    All other components within normal limits  URINALYSIS, ROUTINE W REFLEX MICROSCOPIC (NOT AT [facility]) - Abnormal; Notable for the following:    APPearance CLOUDY (*)    All other components within normal limits  TROPONIN I  CBC WITH DIFFERENTIAL/PLATELET  PREGNANCY, URINE  URINE RAPID DRUG SCREEN, HOSP PERFORMED    EKG   EKG Interpretation  Date/Time:  Thursday February 07 2016 21:24:51 EDT Ventricular Rate:  83 PR Interval:    QRS Duration: 97 QT Interval:  483 QTC Calculation: 568 R Axis:   9 Text Interpretation:  Sinus rhythm RSR' in V1 or V2, probably normal variant Borderline ST elevation, anterior leads Prolonged QT interval Confirmed by Ryenn Howeth MD, Otelia Hettinger ([id number]) on 02/07/2016 10:35:42 PM       Radiology No results found.  Procedures Procedures (including critical care time)  Medications Ordered in ED Medications  cloNIDine (CATAPRES) tablet 0.1 mg (0.1 mg Oral Given 02/07/16 2127)     Initial Impression / Assessment and Plan / ED Course  I have reviewed the triage vital signs and the nursing notes.  Pertinent labs & imaging results that were available during my care of the patient were reviewed by me and considered in my medical decision making (see chart for details).  Clinical Course    BP down to 132/90.  Her h/a is better.  I will give pt a rx for clonidine as that helped.  The pt also notified that her BS was slightly high.  She will be [facility] the [facility] as she does not have a pcp.  Pt in agreement with plan.  Final Clinical Impressions(s) / ED Diagnoses   Final diagnoses:  Essential hypertension  Hyperglycemia    New Prescriptions New Prescriptions   CLONIDINE (CATAPRES) 0.1 MG TABLET    Take 1 tablet (0.1 mg total) by mouth daily.     Jacalyn Lefevre, MD 02/07/16 2242

## 2016-02-07 NOTE — Progress Notes (Signed)
Patient listed as having Medicaid Family Practice Medicaid.  EDCM went to speak to patient at bedside.  Patient reports she is aware that this type of Medicaid does not cover primary medical doctor services, medications or specialists.  [facility] the following:   ED[facility], informed patient of services there and walk in times.  EDCM also provided patient with list of pcps who accept self pay patients, list of discount pharmacies and websites needymeds.org and GoodRX.com for medication assistance, phone number to inquire about the orange card, phone number to inquire about Medicaid, phone number to inquire about the Affordable Care Act, financial resources in the community such as local churches, salvation army, urban ministries, and dental assistance for uninsured patients.  Patient thankfulf or resources.  No further EDCM needs at this time. [facility] provided patient with [facility] information for [facility].  [facility], [facility].

## 2016-02-07 NOTE — ED Triage Notes (Signed)
Pt complains of dizziness, headache for 1 week. Pt states she was put on HCTZ but is still hypertensive.

## 2016-02-15 ENCOUNTER — Emergency Department (HOSPITAL_COMMUNITY)
Admission: EM | Admit: 2016-02-15 | Discharge: 2016-02-15 | Disposition: A | Discharge status: Home / Self Care | Payer: Medicaid Other | Attending: Dermatology | Admitting: Dermatology

## 2016-02-15 ENCOUNTER — Encounter (HOSPITAL_COMMUNITY): Payer: Self-pay | Admitting: Emergency Medicine

## 2016-02-15 DIAGNOSIS — G8929 Other chronic pain: Secondary | ICD-10-CM | POA: Insufficient documentation

## 2016-02-15 DIAGNOSIS — F17210 Nicotine dependence, cigarettes, uncomplicated: Secondary | ICD-10-CM | POA: Insufficient documentation

## 2016-02-15 DIAGNOSIS — M25551 Pain in right hip: Secondary | ICD-10-CM | POA: Insufficient documentation

## 2016-02-15 DIAGNOSIS — I10 Essential (primary) hypertension: Secondary | ICD-10-CM | POA: Insufficient documentation

## 2016-02-15 DIAGNOSIS — Z5321 Procedure and treatment not carried out due to patient leaving prior to being seen by health care provider: Secondary | ICD-10-CM | POA: Insufficient documentation

## 2016-02-15 HISTORY — DX: Sciatica, unspecified side: M54.30

## 2016-02-15 NOTE — ED Notes (Signed)
pts name called for a room no answer 

## 2016-02-15 NOTE — ED Triage Notes (Signed)
Pt. reports chronic right hip pain radiating to right buttocks and right thigh for several months , denies fall or injury , ambulatory .

## 2016-02-15 NOTE — ED Notes (Addendum)
pts name called to recheck vitals no answer 

## 2016-03-05 ENCOUNTER — Emergency Department (HOSPITAL_COMMUNITY)
Admission: EM | Admit: 2016-03-05 | Discharge: 2016-03-05 | Disposition: A | Discharge status: Home / Self Care | Payer: Self-pay | Attending: Emergency Medicine | Admitting: Emergency Medicine

## 2016-03-05 ENCOUNTER — Encounter (HOSPITAL_COMMUNITY): Payer: Self-pay | Admitting: *Deleted

## 2016-03-05 ENCOUNTER — Other Ambulatory Visit: Payer: Self-pay

## 2016-03-05 ENCOUNTER — Ambulatory Visit (HOSPITAL_COMMUNITY)
Admission: RE | Admit: 2016-03-05 | Discharge: 2016-03-05 | Disposition: A | Discharge status: Home / Self Care | Payer: Medicaid Other | Attending: Psychiatry | Admitting: Psychiatry

## 2016-03-05 ENCOUNTER — Emergency Department (HOSPITAL_COMMUNITY): Payer: Self-pay

## 2016-03-05 DIAGNOSIS — F17210 Nicotine dependence, cigarettes, uncomplicated: Secondary | ICD-10-CM | POA: Insufficient documentation

## 2016-03-05 DIAGNOSIS — R079 Chest pain, unspecified: Secondary | ICD-10-CM | POA: Insufficient documentation

## 2016-03-05 DIAGNOSIS — I10 Essential (primary) hypertension: Secondary | ICD-10-CM | POA: Insufficient documentation

## 2016-03-05 LAB — I-STAT TROPONIN, ED
Troponin i, poc: 0 ng/mL (ref 0.00–0.08)
Troponin i, poc: 0 ng/mL (ref 0.00–0.08)

## 2016-03-05 LAB — CBC
HEMATOCRIT: 38 % (ref 36.0–46.0)
Hemoglobin: 12.1 g/dL (ref 12.0–15.0)
MCH: 28.6 pg (ref 26.0–34.0)
MCHC: 31.8 g/dL (ref 30.0–36.0)
MCV: 89.8 fL (ref 78.0–100.0)
Platelets: 294 10*3/uL (ref 150–400)
RBC: 4.23 MIL/uL (ref 3.87–5.11)
RDW: 14.1 % (ref 11.5–15.5)
WBC: 6.8 10*3/uL (ref 4.0–10.5)

## 2016-03-05 LAB — BASIC METABOLIC PANEL
Anion gap: 7 (ref 5–15)
BUN: 10 mg/dL (ref 6–20)
CALCIUM: 9.2 mg/dL (ref 8.9–10.3)
CO2: 27 mmol/L (ref 22–32)
CREATININE: 0.91 mg/dL (ref 0.44–1.00)
Chloride: 105 mmol/L (ref 101–111)
GFR calc Af Amer: >60 mL/min (ref >60)
GFR calc non Af Amer: >60 mL/min (ref >60)
GLUCOSE: 105 mg/dL — AB (ref 65–99)
Potassium: 3.5 mmol/L (ref 3.5–5.1)
Sodium: 139 mmol/L (ref 135–145)

## 2016-03-05 LAB — ETHANOL: Alcohol, Ethyl (B): <5 mg/dL (ref <5)

## 2016-03-05 LAB — HEPATIC FUNCTION PANEL
ALK PHOS: 51 U/L (ref 38–126)
ALT: 18 U/L (ref 14–54)
AST: 30 U/L (ref 15–41)
Albumin: 3.4 g/dL — ABNORMAL LOW (ref 3.5–5.0)
Total Bilirubin: 0.5 mg/dL (ref 0.3–1.2)
Total Protein: 6 g/dL — ABNORMAL LOW (ref 6.5–8.1)

## 2016-03-05 NOTE — ED Triage Notes (Signed)
Pt here per [facility] with complaint of chest pain.  Pt advises she has been "drinking, smoking crack and pot".

## 2016-03-05 NOTE — ED Notes (Signed)
XR department contacted RN about pipes that fell out of patient's belongings while waiting for an xray. [employer] notified and 2 glass pipes disposed of in sharps container. Pt. Made aware.

## 2016-03-05 NOTE — Discharge Instructions (Signed)

## 2016-03-05 NOTE — [facility] Counselor (Signed)
Pt arrived as a walk-in but Pt reported severe withdrawal symptoms and chest pain. This Clinical research associatewriter [facility] the [facility] who [facility]. [facility].  Lindsey [name], [facility] Triage Specialist

## 2016-03-05 NOTE — ED Provider Notes (Signed)
Emergency Department Provider Note   I have reviewed the triage vital signs and the nursing notes.   HISTORY  Chief Complaint Chest Pain   HPI Lindsey [name] is a 40 y.o. female with PMH of polysubstance abuse, HTN, and GERD presents to the emergency department for evaluation of chest pain in the setting of using multiple drugs including crack and drinking alcohol. The patient initially presented to [facility] for an appointment and was transported to the emergency department by EMS for further evaluation.  Patient, during my interview, endorses pain in both arms and itching all over her body. When asked about drug or alcohol use today she states she reports "I used it all." HPI and ROS limited by apparent acute intoxication. She denies any head trauma or assault.    Past Medical History:  Diagnosis Date  . GERD (gastroesophageal reflux disease)   . H/O drug abuse    Admits to crack cocaine use yesterday 12/21/2014  . Hypertension   . Pregnancy induced hypertension   . Sciatica   . Trichomonas     Patient Active Problem List   Diagnosis Date Noted  . Vaginal delivery 12/31/2014  . Active labor 12/31/2014  . Cocaine abuse affecting pregnancy in third trimester 12/22/2014  . AMA (advanced maternal age) multigravida 35+ 08/23/2014  . Anxiety 08/10/2014  . Depressed 08/10/2014  . Hx of drug abuse 08/10/2014  . History of cesarean delivery 07/26/2014  . Supervision of normal subsequent pregnancy 07/11/2014    Past Surgical History:  Procedure Laterality Date  . CESAREAN SECTION  03/19/2012   Procedure: CESAREAN SECTION;  Surgeon: Antionette Char, MD;  Location: [facility];  Service: Obstetrics;  Laterality: N/A;  . TUBAL LIGATION Bilateral 01/01/2015   Procedure: POST PARTUM TUBAL LIGATION;  Surgeon: Adam Phenix, MD;  Location: [facility];  Service: Gynecology;  Laterality: Bilateral;    Current Outpatient Rx  . Order #: [id number] Class: Historical Med  . Order #:  [id number] Class: Print  . Order #: [id number] Class: Print  . Order #: [id number] Class: Print  . Order #: [id number] Class: Print  . Order #: [id number] Class: Print  . Order #: [id number] Class: Historical Med  . Order #: [id number] Class: Print  . Order #: [id number] Class: Historical Med  . Order #: [id number] Class: Historical Med    Allergies Review of patient's allergies indicates no known allergies.  Family History  Problem Relation Age of Onset  . Cancer Mother   . Diabetes Father   . Cancer Maternal Grandmother   . Anesthesia problems Neg Hx   . Hypotension Neg Hx   . Malignant hyperthermia Neg Hx   . Pseudochol deficiency Neg Hx     Social History Social History  Substance Use Topics  . Smoking status: Current Some Day Smoker    Packs/day: 0.01    Types: Cigarettes  . Smokeless tobacco: Never Used  . Alcohol use Yes    Review of Systems  Limited by acute intoxication.   ____________________________________________   PHYSICAL EXAM:  VITAL SIGNS: ED Triage Vitals  Enc Vitals Group     BP 03/05/16 1409 125/69     Pulse Rate 03/05/16 1409 74     Resp 03/05/16 1409 14     Temp 03/05/16 1429 97.6 F (36.4 C)     Temp Source 03/05/16 1429 Oral     SpO2 03/05/16 1409 100 %     Weight 03/05/16 1434 120 lb (54.4 kg)     Height 03/05/16 1434 5\' 2"  (1.575 m)  Constitutional: Awakens to voice. Becomes mildly agitated with frequent shifting in the bed but then falls back asleep.  Eyes: Conjunctivae are normal. PERRL.  Head: Atraumatic. Nose: No congestion/rhinnorhea. Mouth/Throat: Mucous membranes are moist.  Oropharynx non-erythematous. Neck: No stridor. No cervical spine tenderness to palpation. Cardiovascular: Normal rate, regular rhythm. Good peripheral circulation. Grossly normal heart sounds.   Respiratory: Normal respiratory effort.  No retractions. Lungs CTAB. Gastrointestinal: Soft and nontender. No distention.  Musculoskeletal: No lower extremity tenderness  nor edema. No gross deformities of extremities. Neurologic:  Normal speech and language. No gross focal neurologic deficits are appreciated.  Skin:  Skin is warm, dry and intact. No rash noted. Psychiatric: Mood and affect are normal. Speech and behavior are normal.  ____________________________________________   LABS (all labs ordered are listed, but only abnormal results are displayed)  Labs Reviewed  BASIC METABOLIC PANEL - Abnormal; Notable for the following:       Result Value   Glucose, Bld 105 (*)    All other components within normal limits  HEPATIC FUNCTION PANEL - Abnormal; Notable for the following:    Total Protein 6.0 (*)    Albumin 3.4 (*)    Bilirubin, Direct <0.1 (*)    All other components within normal limits  CBC  ETHANOL  I-STAT TROPOININ, ED  I-STAT TROPOININ, ED   ____________________________________________  EKG   EKG Interpretation  Date/Time:  Wednesday March 05 2016 14:01:50 EDT Ventricular Rate:  62 PR Interval:  136 QRS Duration: 90 QT Interval:  476 QTC Calculation: 483 R Axis:   28 Text Interpretation:  Normal sinus rhythm Septal infarct , age undetermined Abnormal ECG No STEMI. Similar to prior tracing.  Confirmed by Kalyiah Saintil MD, Anaily Ashbaugh [phone]) on 03/05/2016 3:35:33 PM      ____________________________________________  RADIOLOGY  Dg Chest 2 View  Result Date: 03/05/2016 CLINICAL DATA:  Chest pains, withdrawal symptoms. EXAM: CHEST  2 VIEW COMPARISON:  Chest x-ray of June 26, 2009 FINDINGS: The lungs are mildly hyperinflated. There is no focal infiltrate. There is no pleural effusion or pneumothorax or pneumomediastinum. The heart and pulmonary vascularity are normal. There is curvature of the mid thoracic spine convex toward the right that is likely positional. No acute bony abnormality is observed. IMPRESSION: Hyperinflation consistent with COPD or reactive airway disease, stable. No acute cardiopulmonary abnormality. Electronically  Signed   By: David  [place] M.D.   On: 03/05/2016 15:32    ____________________________________________   PROCEDURES  Procedure(s) performed:   Procedures  None ____________________________________________   INITIAL IMPRESSION / ASSESSMENT AND PLAN / ED COURSE  Pertinent labs & imaging results that were available during my care of the patient were reviewed by me and considered in my medical decision making (see chart for details).  Patient presents to the emergency department for evaluation of chest pain in the setting of crack use. She appears to be acutely intoxicated and by history is somewhat limited by this. She has no apparent sign of injury to her head, neck, torso, arms, legs. She has normal oxygen saturation and breathing rate with no wheezing. No evidence of COPD exacerbation and associated CO2 narcosis. Patient is at somewhat elevated risk for acute MI with cocaine abuse and HTN. Plan to follow biomarkers, chemistry, alcohol, and reassess when clinically more sober.   05:48PM More talkative. Continues to seem intoxicated. Seems to be improving clinically. No additional testing or imaging at this time.   07:45 PM Patient is now awake and alert. She states that she  feels like she's been getting sick recently. Denies current chest pain. She reports that her primary concern right now is that she is very hungry. She has been eating crackers and drinking fluids in the emergency department. No significant lab abnormalities or concern on my reevaluation of the patient to suggest further workup at this time. Plan for discharge with strong return precautions.   At this time, I do not feel there is any life-threatening condition present. I have reviewed and discussed all results (EKG, imaging, lab, urine as appropriate), exam findings with patient. I have reviewed nursing notes and appropriate previous records.  I feel the patient is safe to be discharged home without further emergent  workup. Discussed usual and customary return precautions. Patient and family (if present) verbalize understanding and are comfortable with this plan.  Patient will follow-up with their primary care provider. If they do not have a primary care provider, information for follow-up has been provided to them. All questions have been answered.  ____________________________________________  FINAL CLINICAL IMPRESSION(S) / ED DIAGNOSES  Final diagnoses:  Nonspecific chest pain     MEDICATIONS GIVEN DURING THIS VISIT:  None  NEW OUTPATIENT MEDICATIONS STARTED DURING THIS VISIT:  None   Note:  This document was prepared using Dragon voice recognition software and may include unintentional dictation errors.  Alona BeneJoshua Blandon Offerdahl, MD Emergency Medicine   Maia PlanJoshua G Darrell Leonhardt, MD 03/06/16 [phone]

## 2016-09-08 ENCOUNTER — Telehealth (HOSPITAL_COMMUNITY): Payer: Self-pay | Admitting: *Deleted

## 2016-09-08 NOTE — Telephone Encounter (Signed)
Refer to telephone call

## 2017-02-26 IMAGING — US US OB FOLLOW-UP
1 series · 12 of 28 positions shown · non-contrast
Comparison: none

[Series 1: us ob follow up · 12 of 57 slices shown]
[im 3/57]
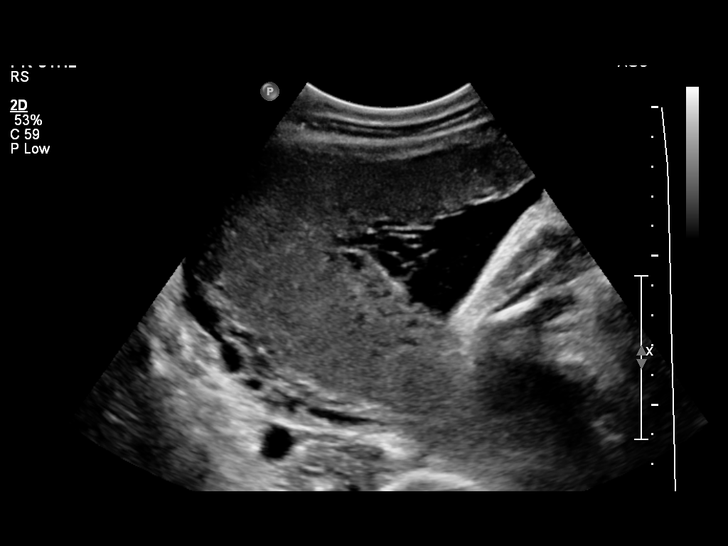
[im 7/57]
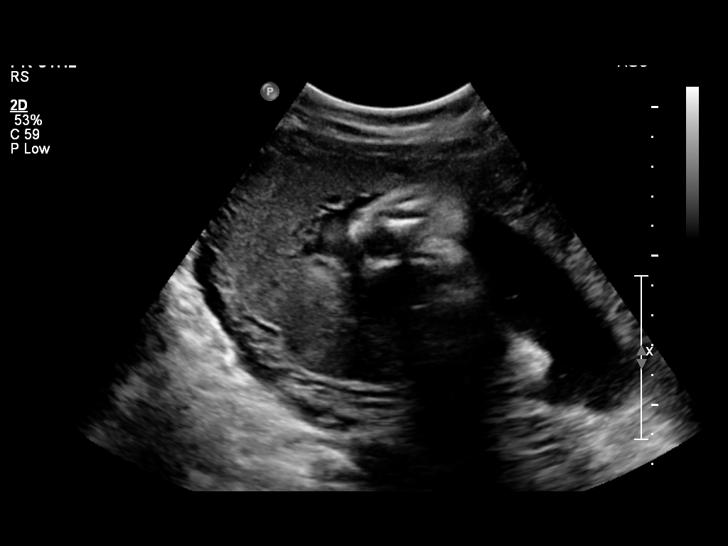
[im 11/57]
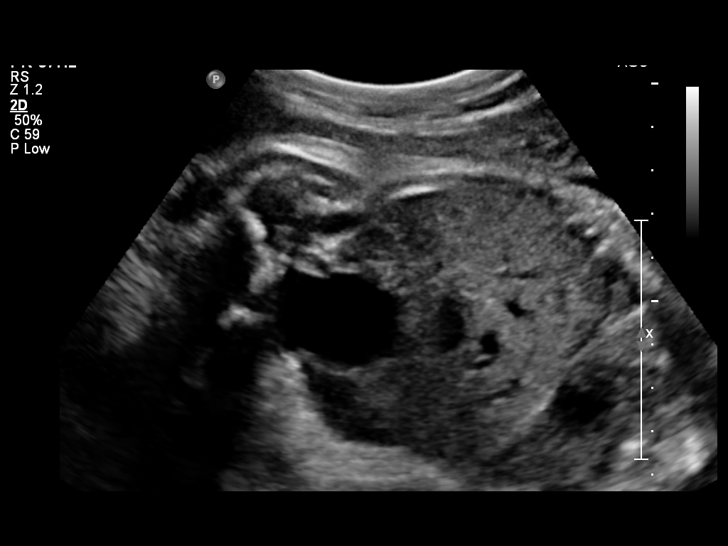
[im 17/57]
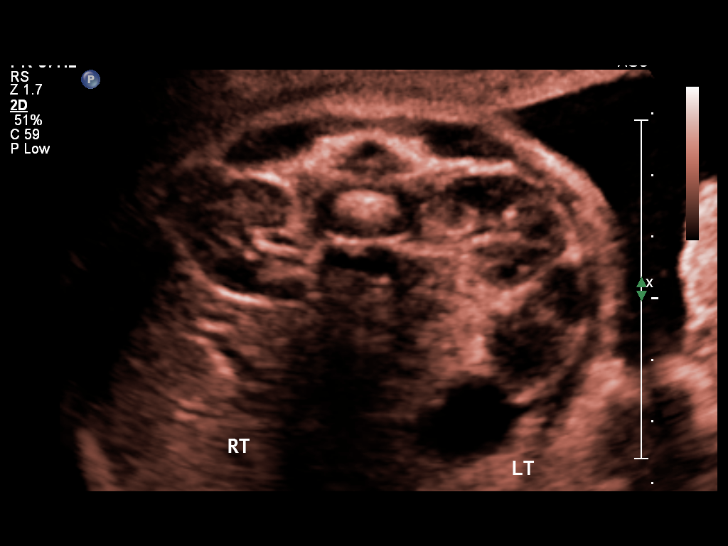
[im 21/57]
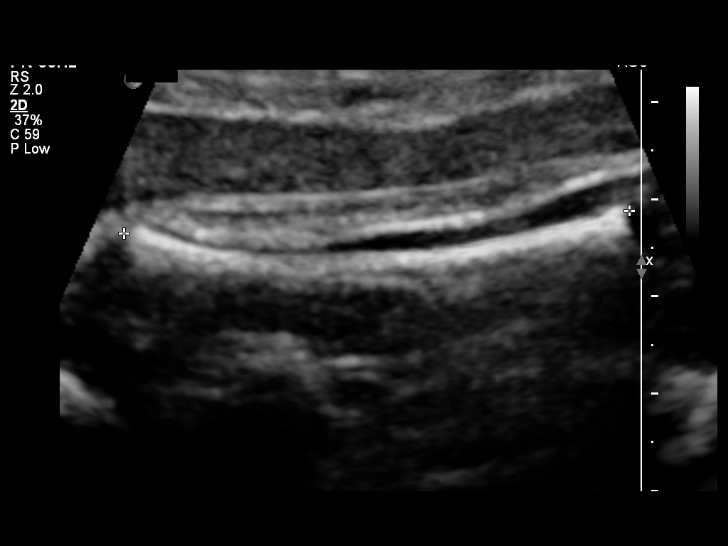
[im 25/57]
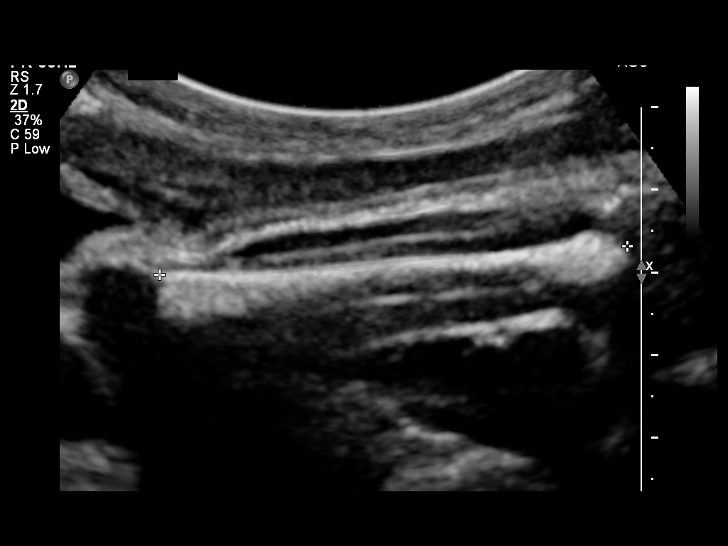
[im 32/57]
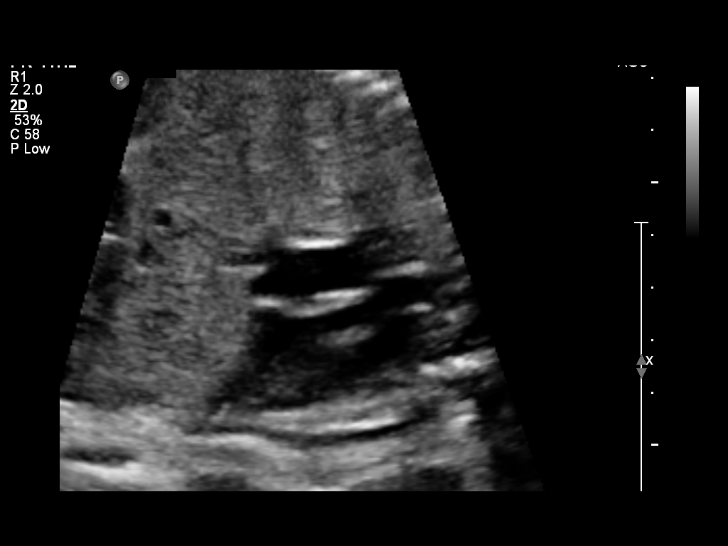
[im 36/57]
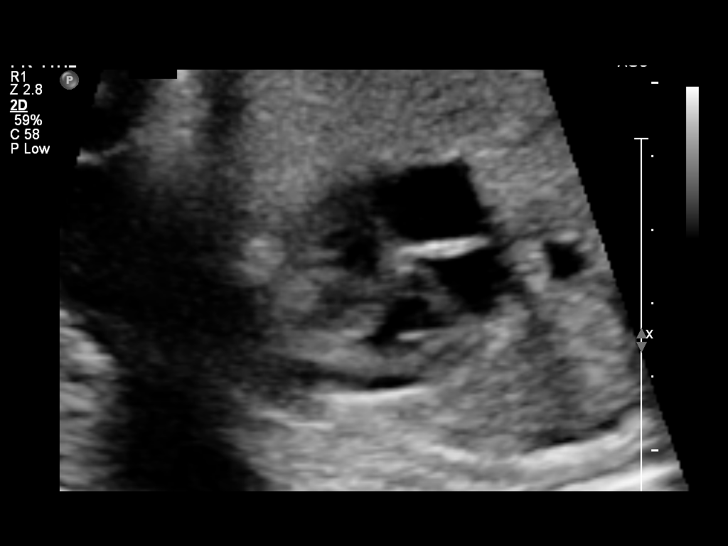
[im 40/57]
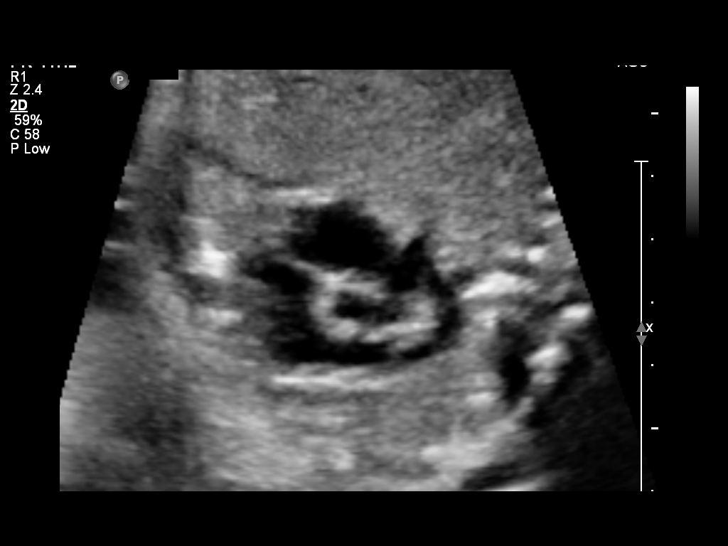
[im 46/57]
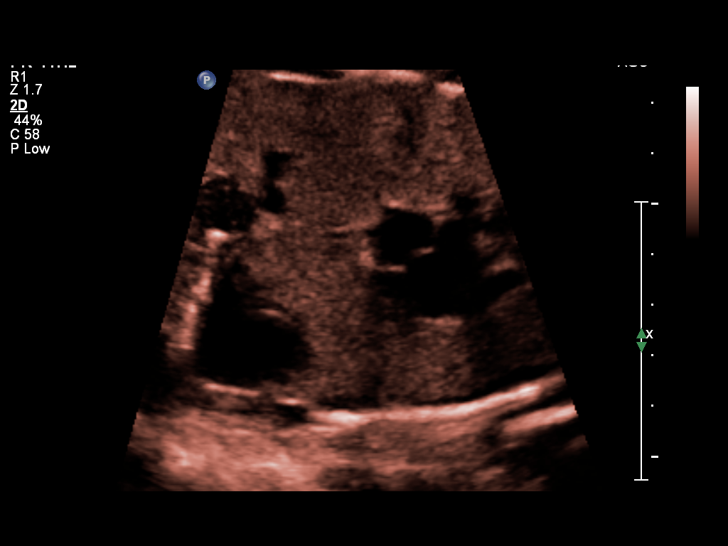
[im 50/57]
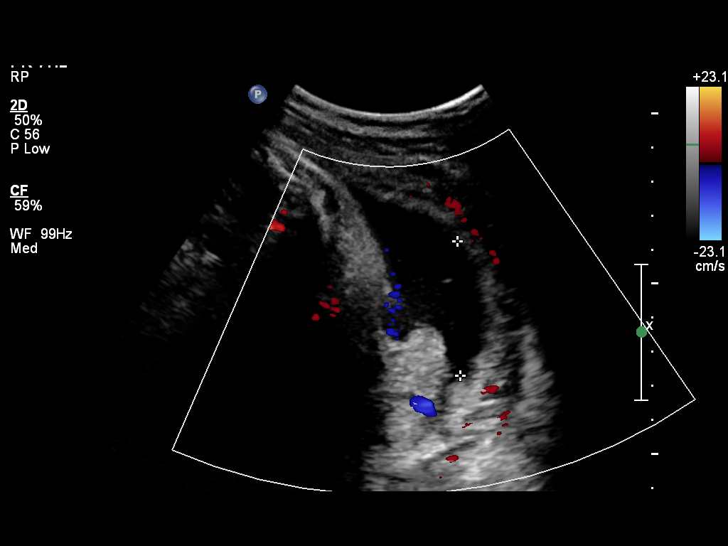
[im 54/57]
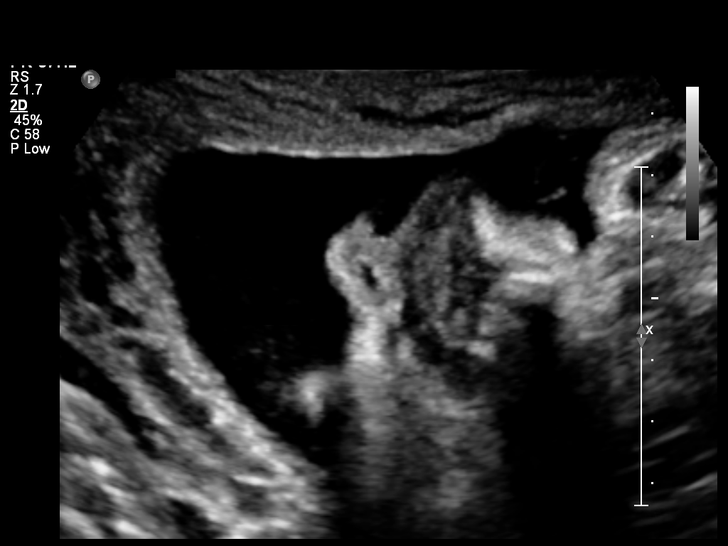

[12 of 28 positions shown; findings below may reference images not displayed]

OBSTETRICS REPORT
(Signed Final 11/28/2014 [DATE])

Service(s) Provided

US OB FOLLOW UP                                       76816.1
Indications

32 weeks gestation of pregnancy
Previous cesarean section
Advanced maternal age multigravida 35+, second
trimester
Drug dependence complicating
pregnancy,antepartum condition or complication
Quetiaprine
Drug use complicating pregnancy, unspecified
trimester H/O Crack use
Poor obstetric history: Previous preeclampsia /
eclampsia/gestational HTN
Fetal Evaluation

Num Of Fetuses:    1
Fetal Heart Rate:  134                          bpm
Cardiac Activity:  Observed
Presentation:      Cephalic
Placenta:          Posterior Fundal, above
cervical os
P. Cord            Previously Visualized
Insertion:

Amniotic Fluid
AFI FV:      Subjectively within normal limits
AFI Sum:     15.06   cm       53  %Tile     Larg Pckt:    5.35  cm
RUQ:   2.99    cm   RLQ:    5.35   cm    LUQ:   3.93    cm   LLQ:    2.79   cm
Biometry

BPD:     78.2  mm     G. Age:  31w 3d                CI:        74.49   70 - 86
FL/HC:      19.6   19.1 -
21.3
HC:     287.6  mm     G. Age:  31w 4d        7  %    HC/AC:      1.01   0.96 -
1.17
AC:     284.8  mm     G. Age:  32w 3d       56  %    FL/BPD:     72.1   71 - 87
FL:      56.4  mm     G. Age:  29w 5d      < 3  %    FL/AC:      19.8   20 - 24
HUM:     51.6  mm     G. Age:  30w 1d        8  %

Est. FW:    7773  gm    3 lb 15 oz      41  %
Gestational Age

LMP:           32w 2d        Date:  04/16/14                 EDD:   01/21/15
U/S Today:     31w 2d                                        EDD:   01/28/15
Best:          32w 2d     Det. By:  LMP  (04/16/14)          EDD:   01/21/15
Anatomy

Cranium:          Previously seen        Aortic Arch:      Previously seen
Fetal Cavum:      Previously seen        Ductal Arch:      Previously seen
Ventricles:       Appears normal         Diaphragm:        Appears normal
Choroid Plexus:   Previously seen        Stomach:          Appears normal, left
sided
Cerebellum:       Previously seen        Abdomen:          Previously seen
Posterior Fossa:  Previously seen        Abdominal Wall:   Previously seen
Nuchal Fold:      Not applicable (>20    Cord Vessels:     Previously seen
wks GA)
Face:             Orbits and profile     Kidneys:          Appear normal
previously seen
Lips:             Previously seen        Bladder:          Appears normal
Heart:            Appears normal         Spine:            Previously seen
(4CH, axis, and
situs)
RVOT:             Appears normal         Lower             Previously seen
Extremities:
LVOT:             Appears normal         Upper             Previously seen
Extremities:

Other:  Fetus appears to be a female. Nasal bone previously visualized. 5th
digit previously visualized. Technically difficult due to fetal position.
Targeted Anatomy

Fetal Central Nervous System
Lat. Ventricles:
Cervix Uterus Adnexa

Cervix:       Not visualized (advanced GA >74wks)
Uterus:       No abnormality visualized.
Left Ovary:    Not visualized.
Right Ovary:   Not visualized.
Adnexa:     No abnormality visualized. No adnexal mass visualized.
Impression

Single IUP at 32w 2d
hx of drug abuse, advanced maternal age
Normal interval anatomy
Fetal growth is appropriate (41st %tile)
The femurs measure at the 3rd %tile.  Normal humeri.  The
long bones appear normal in morphology - likely constitutional
Normal amniotic fluid volume
Recommendations

Recommend follow-up ultrasound examination for growth in 4
weeks

## 2017-04-10 ENCOUNTER — Emergency Department (HOSPITAL_COMMUNITY): Payer: Self-pay

## 2017-04-10 ENCOUNTER — Encounter (HOSPITAL_COMMUNITY): Payer: Self-pay | Admitting: Emergency Medicine

## 2017-04-10 DIAGNOSIS — Z79899 Other long term (current) drug therapy: Secondary | ICD-10-CM | POA: Insufficient documentation

## 2017-04-10 DIAGNOSIS — F17210 Nicotine dependence, cigarettes, uncomplicated: Secondary | ICD-10-CM | POA: Insufficient documentation

## 2017-04-10 DIAGNOSIS — H1031 Unspecified acute conjunctivitis, right eye: Secondary | ICD-10-CM | POA: Insufficient documentation

## 2017-04-10 DIAGNOSIS — I10 Essential (primary) hypertension: Secondary | ICD-10-CM | POA: Insufficient documentation

## 2017-04-10 DIAGNOSIS — G8929 Other chronic pain: Secondary | ICD-10-CM | POA: Insufficient documentation

## 2017-04-10 DIAGNOSIS — M25512 Pain in left shoulder: Secondary | ICD-10-CM | POA: Insufficient documentation

## 2017-04-10 LAB — CBC
HCT: 36.2 % (ref 36.0–46.0)
HEMOGLOBIN: 12 g/dL (ref 12.0–15.0)
MCH: 28.2 pg (ref 26.0–34.0)
MCHC: 33.1 g/dL (ref 30.0–36.0)
MCV: 85 fL (ref 78.0–100.0)
Platelets: 404 10*3/uL — ABNORMAL HIGH (ref 150–400)
RBC: 4.26 MIL/uL (ref 3.87–5.11)
RDW: 14.2 % (ref 11.5–15.5)
WBC: 11.4 10*3/uL — ABNORMAL HIGH (ref 4.0–10.5)

## 2017-04-10 LAB — BASIC METABOLIC PANEL
ANION GAP: 11 (ref 5–15)
BUN: 10 mg/dL (ref 6–20)
CALCIUM: 8.8 mg/dL — AB (ref 8.9–10.3)
CO2: 25 mmol/L (ref 22–32)
Chloride: 100 mmol/L — ABNORMAL LOW (ref 101–111)
Creatinine, Ser: 1.1 mg/dL — ABNORMAL HIGH (ref 0.44–1.00)
GLUCOSE: 116 mg/dL — AB (ref 65–99)
Potassium: 3.3 mmol/L — ABNORMAL LOW (ref 3.5–5.1)
Sodium: 136 mmol/L (ref 135–145)

## 2017-04-10 LAB — I-STAT TROPONIN, ED: TROPONIN I, POC: 0.01 ng/mL (ref 0.00–0.08)

## 2017-04-10 MED ORDER — IBUPROFEN 200 MG PO TABS
ORAL_TABLET | ORAL | Status: AC
  Filled 2017-04-10: qty 1

## 2017-04-10 MED ORDER — ONDANSETRON 4 MG PO TBDP
4.0000 mg | ORAL_TABLET | Freq: Once | ORAL | Status: AC
  Administered 2017-04-10: 4 mg via ORAL

## 2017-04-10 MED ORDER — ONDANSETRON 4 MG PO TBDP
ORAL_TABLET | ORAL | Status: AC
  Filled 2017-04-10: qty 1

## 2017-04-10 MED ORDER — IBUPROFEN 400 MG PO TABS
600.0000 mg | ORAL_TABLET | Freq: Once | ORAL | Status: AC
  Administered 2017-04-10: 600 mg via ORAL

## 2017-04-10 MED ORDER — IBUPROFEN 400 MG PO TABS
ORAL_TABLET | ORAL | Status: AC
  Filled 2017-04-10: qty 1

## 2017-04-10 NOTE — ED Triage Notes (Addendum)
C/o redness, pain, and green drainage to R eye since 4:30pm.  Also reports pain to L shoulder that radiates to L neck x 1 week with sob.  Pt thrashing around in wheelchair and states she is unable to sit still due to L shoulder pain.  Denies known injury. States last drug use yesterday.

## 2017-04-11 ENCOUNTER — Emergency Department (HOSPITAL_COMMUNITY)
Admission: EM | Admit: 2017-04-11 | Discharge: 2017-04-11 | Disposition: A | Discharge status: Home / Self Care | Payer: Self-pay | Attending: Emergency Medicine | Admitting: Emergency Medicine

## 2017-04-11 DIAGNOSIS — H1031 Unspecified acute conjunctivitis, right eye: Secondary | ICD-10-CM

## 2017-04-11 DIAGNOSIS — M25512 Pain in left shoulder: Secondary | ICD-10-CM

## 2017-04-11 DIAGNOSIS — G8929 Other chronic pain: Secondary | ICD-10-CM

## 2017-04-11 HISTORY — DX: Anxiety disorder, unspecified: F41.9

## 2017-04-11 MED ORDER — POLYMYXIN B-TRIMETHOPRIM 10000-0.1 UNIT/ML-% OP SOLN: 1.0000 [drp] | OPHTHALMIC | 0 refills | Status: DC
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
## 2017-04-11 NOTE — Discharge Instructions (Signed)
Start using the drops as directed.  Try to keep eye clean with warm rag like you have been doing at home. Follow-up with your primary care doctor. Return to the ED for new or worsening symptoms.

## 2017-12-12 ENCOUNTER — Emergency Department (HOSPITAL_COMMUNITY): Payer: Self-pay

## 2017-12-12 ENCOUNTER — Other Ambulatory Visit: Payer: Self-pay

## 2017-12-12 ENCOUNTER — Emergency Department (HOSPITAL_COMMUNITY)
Admission: EM | Admit: 2017-12-12 | Discharge: 2017-12-12 | Disposition: A | Discharge status: Home / Self Care | Payer: Self-pay | Attending: Emergency Medicine | Admitting: Emergency Medicine

## 2017-12-12 ENCOUNTER — Encounter (HOSPITAL_COMMUNITY): Payer: Self-pay

## 2017-12-12 DIAGNOSIS — R112 Nausea with vomiting, unspecified: Secondary | ICD-10-CM | POA: Insufficient documentation

## 2017-12-12 DIAGNOSIS — I10 Essential (primary) hypertension: Secondary | ICD-10-CM | POA: Insufficient documentation

## 2017-12-12 DIAGNOSIS — F17210 Nicotine dependence, cigarettes, uncomplicated: Secondary | ICD-10-CM | POA: Insufficient documentation

## 2017-12-12 DIAGNOSIS — E860 Dehydration: Secondary | ICD-10-CM | POA: Insufficient documentation

## 2017-12-12 DIAGNOSIS — Z79899 Other long term (current) drug therapy: Secondary | ICD-10-CM | POA: Insufficient documentation

## 2017-12-12 LAB — COMPREHENSIVE METABOLIC PANEL
ALBUMIN: 4.4 g/dL (ref 3.5–5.0)
ALT: 21 U/L (ref 0–44)
ANION GAP: 11 (ref 5–15)
AST: 24 U/L (ref 15–41)
Alkaline Phosphatase: 62 U/L (ref 38–126)
BILIRUBIN TOTAL: 0.9 mg/dL (ref 0.3–1.2)
BUN: 27 mg/dL — AB (ref 6–20)
CHLORIDE: 101 mmol/L (ref 98–111)
CO2: 28 mmol/L (ref 22–32)
Calcium: 9.2 mg/dL (ref 8.9–10.3)
Creatinine, Ser: 1.44 mg/dL — ABNORMAL HIGH (ref 0.44–1.00)
GFR calc Af Amer: 51 mL/min — ABNORMAL LOW (ref >60)
GFR, EST NON AFRICAN AMERICAN: 44 mL/min — AB (ref >60)
GLUCOSE: 111 mg/dL — AB (ref 70–99)
Potassium: 3 mmol/L — ABNORMAL LOW (ref 3.5–5.1)
SODIUM: 140 mmol/L (ref 135–145)
TOTAL PROTEIN: 7.8 g/dL (ref 6.5–8.1)

## 2017-12-12 LAB — I-STAT BETA HCG BLOOD, ED (MC, WL, AP ONLY): I-stat hCG, quantitative: <5 m[IU]/mL (ref <5)

## 2017-12-12 LAB — URINALYSIS, ROUTINE W REFLEX MICROSCOPIC
BACTERIA UA: NONE SEEN
BILIRUBIN URINE: NEGATIVE
GLUCOSE, UA: NEGATIVE mg/dL
KETONES UR: NEGATIVE mg/dL
NITRITE: NEGATIVE
PH: 5 (ref 5.0–8.0)
Protein, ur: 30 mg/dL — AB
SPECIFIC GRAVITY, URINE: 1.019 (ref 1.005–1.030)

## 2017-12-12 LAB — CBC WITH DIFFERENTIAL/PLATELET
Basophils Absolute: 0 10*3/uL (ref 0.0–0.1)
Basophils Relative: 1 %
EOS ABS: 0 10*3/uL (ref 0.0–0.7)
EOS PCT: 0 %
HCT: 42.5 % (ref 36.0–46.0)
Hemoglobin: 13.5 g/dL (ref 12.0–15.0)
LYMPHS ABS: 2.5 10*3/uL (ref 0.7–4.0)
LYMPHS PCT: 43 %
MCH: 25.9 pg — ABNORMAL LOW (ref 26.0–34.0)
MCHC: 31.8 g/dL (ref 30.0–36.0)
MCV: 81.6 fL (ref 78.0–100.0)
MONO ABS: 0.5 10*3/uL (ref 0.1–1.0)
Monocytes Relative: 9 %
Neutro Abs: 2.8 10*3/uL (ref 1.7–7.7)
Neutrophils Relative %: 47 %
PLATELETS: 377 10*3/uL (ref 150–400)
RBC: 5.21 MIL/uL — ABNORMAL HIGH (ref 3.87–5.11)
RDW: 15.5 % (ref 11.5–15.5)
WBC: 5.9 10*3/uL (ref 4.0–10.5)

## 2017-12-12 LAB — LIPASE, BLOOD: LIPASE: 45 U/L (ref 11–51)

## 2017-12-12 MED ORDER — SODIUM CHLORIDE 0.9 % IV BOLUS
1000.0000 mL | Freq: Once | INTRAVENOUS | Status: AC
  Administered 2017-12-12: 1000 mL via INTRAVENOUS

## 2017-12-12 MED ORDER — PROMETHAZINE HCL 25 MG PO TABS: 25.0000 mg | ORAL_TABLET | Freq: Four times a day (QID) | ORAL | 0 refills | Status: AC | PRN

## 2017-12-12 MED ORDER — IOPAMIDOL (ISOVUE-300) INJECTION 61%
100.0000 mL | Freq: Once | INTRAVENOUS | Status: AC | PRN
  Administered 2017-12-12: 80 mL via INTRAVENOUS

## 2017-12-12 MED ORDER — METOCLOPRAMIDE HCL 5 MG/ML IJ SOLN
10.0000 mg | Freq: Once | INTRAMUSCULAR | Status: AC
  Administered 2017-12-12: 10 mg via INTRAVENOUS
  Filled 2017-12-12: qty 2

## 2017-12-12 MED ORDER — PROMETHAZINE HCL 25 MG/ML IJ SOLN
25.0000 mg | Freq: Once | INTRAMUSCULAR | Status: AC
Start: 2017-12-12 — End: 2017-12-12
  Administered 2017-12-12: 25 mg via INTRAVENOUS
  Filled 2017-12-12 (×2): qty 1

## 2017-12-12 MED ORDER — PROMETHAZINE HCL 25 MG/ML IJ SOLN
12.5000 mg | Freq: Once | INTRAMUSCULAR | Status: DC
  Filled 2017-12-12: qty 1

## 2017-12-12 MED ORDER — SODIUM CHLORIDE 0.9 % IV BOLUS: 1000.0000 mL | Freq: Once | INTRAVENOUS | Status: DC

## 2017-12-12 NOTE — ED Notes (Signed)
Bed: [id number] Expected date: 12/12/17 Expected time: 8:22 AM Means of arrival: Ambulance Comments: N/V x 2 days

## 2017-12-12 NOTE — ED Triage Notes (Signed)
EMS reports from home, pt states said she ate bad meat (canned beef) x 2 days, nausea and vomiting since, denies abdominal pain or diarrhea.  BP 180/120 HR 74 Resp 18 Sp02 100 RA CBG 263  20ga LAC 4mg  Zofran enroute

## 2017-12-12 NOTE — ED Provider Notes (Signed)
[facility]-EMERGENCY DEPT Provider Note   CSN: [id number] Arrival date & time: 12/12/17  4696     History   Chief Complaint Chief Complaint  Patient presents with  . Nausea  . Emesis    HPI Lindsey [name] is a 42 y.o. female.  Pt presents to the ED today with n/v.  The pt said she ate some bad meat and has been vomiting for 2 days.  She is actively vomiting on exam and does not give much hx other than no diarrhea or abdominal pain.   EMS did give her 4 mg of zofran en route without help in n/v.     Past Medical History:  Diagnosis Date  . Anxiety   . GERD (gastroesophageal reflux disease)   . H/O drug abuse    Admits to crack cocaine use yesterday 12/21/2014  . Hypertension   . Pregnancy induced hypertension   . Sciatica   . Trichomonas     Patient Active Problem List   Diagnosis Date Noted  . Vaginal delivery 12/31/2014  . Active labor 12/31/2014  . 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 AMA (advanced maternal age) multigravida 35+ 08/23/2014  . Anxiety 08/10/2014  . Depressed 08/10/2014  . Hx of drug abuse 08/10/2014  . History of cesarean delivery 07/26/2014  . Supervision of normal subsequent pregnancy 07/11/2014    Past Surgical History:  Procedure Laterality Date  . CESAREAN SECTION  03/19/2012   Procedure: CESAREAN SECTION;  Surgeon: Antionette Char, MD;  Location: [facility];  Service: Obstetrics;  Laterality: N/A;  . TUBAL LIGATION Bilateral 01/01/2015   Procedure: POST PARTUM TUBAL LIGATION;  Surgeon: Adam Phenix, MD;  Location: [facility];  Service: Gynecology;  Laterality: Bilateral;     OB History    Gravida  6   Para  3   Term  3   Preterm  0   AB  2   Living  3     SAB  2   TAB  0   Ectopic  0   Multiple  0   Live Births  3            Home Medications    Prior to Admission medications   Medication Sig Start Date End Date Taking? Authorizing Provider    cloNIDine (CATAPRES) 0.1 MG tablet Take 1 tablet (0.1 mg total) by mouth daily. Patient not taking: Reported on 12/12/2017 02/07/16   Jacalyn Lefevre, MD  hydrochlorothiazide (HYDRODIURIL) 25 MG tablet Take 1 tablet (25 mg total) by mouth daily. Patient not taking: Reported on 12/12/2017 01/02/16   Jacalyn Lefevre, MD  hydrochlorothiazide (HYDRODIURIL) 25 MG tablet Take 1 tablet (25 mg total) by mouth daily. Patient not taking: Reported on 02/07/2016 02/05/16   Deatra Canter, FNP  promethazine (PHENERGAN) 25 MG tablet Take 1 tablet (25 mg total) by mouth every 6 (six) hours as needed for nausea or vomiting. 12/12/17   Jacalyn Lefevre, MD    Family History Family History  Problem Relation Age of Onset  . Cancer Mother   . Diabetes Father   . Cancer Maternal Grandmother   . Anesthesia problems Neg Hx   . Hypotension Neg Hx   . Malignant hyperthermia Neg Hx   . Pseudochol deficiency Neg Hx     Social History Social History   Tobacco Use  . Smoking status: Current Some Day Smoker    Packs/day: 0.01    Types: Cigarettes  .  Smokeless tobacco: Never Used  Substance Use Topics  . Alcohol use: Yes  . Drug use: Yes    Types: "Crack" cocaine, Cocaine, Marijuana     Allergies   Patient has no known allergies.   Review of Systems Review of Systems  Gastrointestinal: Positive for nausea and vomiting.  All other systems reviewed and are negative.    Physical Exam Updated Vital Signs BP (!) 183/107   Pulse 66   Temp 97.8 F (36.6 C) (Oral)   Resp 16   Ht 5\' 3"  (1.6 m)   Wt 54.4 kg (120 lb)   SpO2 99%   BMI 21.26 kg/m   Physical Exam  Constitutional: She is oriented to person, place, and time. She appears well-developed. She appears distressed.  HENT:  Head: Normocephalic and atraumatic.  Right Ear: External ear normal.  Left Ear: External ear normal.  Nose: Nose normal.  Mouth/Throat: Mucous membranes are dry.  Eyes: Pupils are equal, round, and reactive to light.  Conjunctivae and EOM are normal.  Neck: Normal range of motion. Neck supple.  Cardiovascular: Normal rate, regular rhythm, normal heart sounds and intact distal pulses.  Pulmonary/Chest: Effort normal and breath sounds normal.  Abdominal: Soft. Bowel sounds are normal.  Musculoskeletal: Normal range of motion.  Neurological: She is alert and oriented to person, place, and time.  Skin: Skin is warm. Capillary refill takes less than 2 seconds.  Psychiatric: She has a normal mood and affect. Her behavior is normal. Judgment and thought content normal.  Nursing note and vitals reviewed.    ED Treatments / Results  Labs (all labs ordered are listed, but only abnormal results are displayed) Labs Reviewed  CBC WITH DIFFERENTIAL/PLATELET - Abnormal; Notable for the following components:      Result Value   RBC 5.21 (*)    MCH 25.9 (*)    All other components within normal limits  COMPREHENSIVE METABOLIC PANEL - Abnormal; Notable for the following components:   Potassium 3.0 (*)    Glucose, Bld 111 (*)    BUN 27 (*)    Creatinine, Ser 1.44 (*)    GFR calc non Af Amer 44 (*)    GFR calc Af Amer 51 (*)    All other components within normal limits  URINALYSIS, ROUTINE W REFLEX MICROSCOPIC - Abnormal; Notable for the following components:   APPearance HAZY (*)    Hgb urine dipstick LARGE (*)    Protein, ur 30 (*)    Leukocytes, UA SMALL (*)    All other components within normal limits  LIPASE, BLOOD  I-STAT BETA HCG BLOOD, ED ([facility], [facility], [facility] ONLY)    EKG None  Radiology Ct Abdomen Pelvis W Contrast  Result Date: 12/12/2017 CLINICAL DATA:  Nausea and vomiting for 2 days. EXAM: CT ABDOMEN AND PELVIS WITH CONTRAST TECHNIQUE: Multidetector CT imaging of the abdomen and pelvis was performed using the standard protocol following bolus administration of intravenous contrast. CONTRAST:  80mL ISOVUE-300 IOPAMIDOL (ISOVUE-300) INJECTION 61% COMPARISON:  None. FINDINGS: Lower chest: No acute  abnormality. Hepatobiliary: No focal liver abnormality is seen. No gallstones, gallbladder wall thickening, or biliary dilatation. Pancreas: Unremarkable. No pancreatic ductal dilatation or surrounding inflammatory changes. Spleen: Normal in size without focal abnormality. Adrenals/Urinary Tract: Adrenal glands are unremarkable. Kidneys are normal, without renal calculi, focal lesion, or hydronephrosis. Bladder is unremarkable. Stomach/Bowel: No dilated large or small bowel loops. No convincing bowel wall thickening or evidence of bowel wall inflammation seen, however, majority of the small and  large bowel is not adequately distended for definitive characterization of their walls. Appendix is not seen. Vascular/Lymphatic: No significant vascular findings are present. No enlarged abdominal or pelvic lymph nodes. Reproductive: No acute findings.  Tubal ligation clips in place. Other: No free fluid or abscess collection identified. No free intraperitoneal air. Musculoskeletal: No acute or suspicious osseous finding. IMPRESSION: No acute findings. No bowel obstruction. No free fluid or abscess collection seen. No convincing evidence of bowel wall thickening or bowel wall inflammation, however, the bowel is difficult to definitively characterize due to the generalized bowel decompression and the paucity of intra-abdominal fat. Electronically Signed   By: Bary RichardStan  Maynard M.D.   On: 12/12/2017 14:34    Procedures Procedures (including critical care time)  Medications Ordered in ED Medications  sodium chloride 0.9 % bolus 1,000 mL (has no administration in time range)  promethazine (PHENERGAN) injection 12.5 mg (has no administration in time range)  promethazine (PHENERGAN) injection 25 mg (25 mg Intravenous Given 12/12/17 1052)  sodium chloride 0.9 % bolus 1,000 mL (0 mLs Intravenous Stopped 12/12/17 1027)  metoCLOPramide (REGLAN) injection 10 mg (10 mg Intravenous Given 12/12/17 1052)  iopamidol (ISOVUE-300) 61 %  injection 100 mL (80 mLs Intravenous Contrast Given 12/12/17 1417)     Initial Impression / Assessment and Plan / ED Course  I have reviewed the triage vital signs and the nursing notes.  Pertinent labs & imaging results that were available during my care of the patient were reviewed by me and considered in my medical decision making (see chart for details).    Pt is no longer actively vomiting.  She still does not want to drink, but she has been rehydrated, labs ok, ct ok.  She is stable for d/c.  Return if worse.  Final Clinical Impressions(s) / ED Diagnoses   Final diagnoses:  Dehydration  Non-intractable vomiting with nausea, unspecified vomiting type    ED Discharge Orders        Ordered    promethazine (PHENERGAN) 25 MG tablet  Every 6 hours PRN     12/12/17 1530       Jacalyn LefevreHaviland, Gwenivere Hiraldo, MD 12/12/17 1532

## 2017-12-12 NOTE — ED Notes (Signed)
Pt provided crackers and ginger ale.

## 2021-10-17 ENCOUNTER — Other Ambulatory Visit: Payer: Self-pay

## 2021-10-17 ENCOUNTER — Encounter (HOSPITAL_COMMUNITY): Payer: Self-pay | Admitting: *Deleted

## 2021-10-17 ENCOUNTER — Emergency Department (HOSPITAL_COMMUNITY): Payer: Self-pay

## 2021-10-17 ENCOUNTER — Emergency Department (HOSPITAL_COMMUNITY)
Admission: EM | Admit: 2021-10-17 | Discharge: 2021-10-17 | Disposition: A | Discharge status: Home / Self Care | Payer: Self-pay | Attending: Emergency Medicine | Admitting: Emergency Medicine

## 2021-10-17 DIAGNOSIS — R1110 Vomiting, unspecified: Secondary | ICD-10-CM | POA: Insufficient documentation

## 2021-10-17 DIAGNOSIS — I10 Essential (primary) hypertension: Secondary | ICD-10-CM

## 2021-10-17 DIAGNOSIS — D72829 Elevated white blood cell count, unspecified: Secondary | ICD-10-CM | POA: Insufficient documentation

## 2021-10-17 DIAGNOSIS — R1084 Generalized abdominal pain: Secondary | ICD-10-CM | POA: Insufficient documentation

## 2021-10-17 LAB — COMPREHENSIVE METABOLIC PANEL
ALT: 25 U/L (ref 0–44)
AST: 31 U/L (ref 15–41)
Albumin: 4.4 g/dL (ref 3.5–5.0)
Alkaline Phosphatase: 59 U/L (ref 38–126)
Anion gap: 11 (ref 5–15)
BUN: 13 mg/dL (ref 6–20)
CO2: 19 mmol/L — ABNORMAL LOW (ref 22–32)
Calcium: 9.2 mg/dL (ref 8.9–10.3)
Chloride: 105 mmol/L (ref 98–111)
Creatinine, Ser: 0.94 mg/dL (ref 0.44–1.00)
GFR, Estimated: >60 mL/min (ref >60)
Glucose, Bld: 136 mg/dL — ABNORMAL HIGH (ref 70–99)
Potassium: 3.3 mmol/L — ABNORMAL LOW (ref 3.5–5.1)
Sodium: 135 mmol/L (ref 135–145)
Total Bilirubin: 0.5 mg/dL (ref 0.3–1.2)
Total Protein: 8 g/dL (ref 6.5–8.1)

## 2021-10-17 LAB — CBC WITH DIFFERENTIAL/PLATELET
Abs Immature Granulocytes: 0 10*3/uL (ref 0.00–0.07)
Basophils Absolute: 0.1 10*3/uL (ref 0.0–0.1)
Basophils Relative: 1 %
Eosinophils Absolute: 0 10*3/uL (ref 0.0–0.5)
Eosinophils Relative: 0 %
HCT: 35.4 % — ABNORMAL LOW (ref 36.0–46.0)
Hemoglobin: 10.5 g/dL — ABNORMAL LOW (ref 12.0–15.0)
Lymphocytes Relative: 11 %
Lymphs Abs: 1.2 10*3/uL (ref 0.7–4.0)
MCH: 20.1 pg — ABNORMAL LOW (ref 26.0–34.0)
MCHC: 29.7 g/dL — ABNORMAL LOW (ref 30.0–36.0)
MCV: 67.7 fL — ABNORMAL LOW (ref 80.0–100.0)
Monocytes Absolute: 0.1 10*3/uL (ref 0.1–1.0)
Monocytes Relative: 1 %
Neutro Abs: 9.7 10*3/uL — ABNORMAL HIGH (ref 1.7–7.7)
Neutrophils Relative %: 87 %
Platelets: 354 10*3/uL (ref 150–400)
RBC: 5.23 MIL/uL — ABNORMAL HIGH (ref 3.87–5.11)
RDW: 21.3 % — ABNORMAL HIGH (ref 11.5–15.5)
WBC: 11.1 10*3/uL — ABNORMAL HIGH (ref 4.0–10.5)
nRBC: 0 % (ref 0.0–0.2)
nRBC: 0 /100 WBC

## 2021-10-17 LAB — LIPASE, BLOOD: Lipase: 27 U/L (ref 11–51)

## 2021-10-17 LAB — HCG, QUANTITATIVE, PREGNANCY: hCG, Beta Chain, Quant, S: 1 m[IU]/mL (ref <5)

## 2021-10-17 MED ORDER — ONDANSETRON 4 MG PO TBDP: 4.0000 mg | ORAL_TABLET | Freq: Three times a day (TID) | ORAL | 0 refills | Status: DC | PRN

## 2021-10-17 MED ORDER — ONDANSETRON 4 MG PO TBDP
4.0000 mg | ORAL_TABLET | Freq: Once | ORAL | Status: AC
  Administered 2021-10-17: 4 mg via ORAL
  Filled 2021-10-17: qty 1

## 2021-10-17 MED ORDER — ACETAMINOPHEN 325 MG PO TABS: 650.0000 mg | ORAL_TABLET | Freq: Four times a day (QID) | ORAL | Status: DC | PRN

## 2021-10-17 MED ORDER — SODIUM CHLORIDE 0.9 % IV BOLUS
1000.0000 mL | Freq: Once | INTRAVENOUS | Status: AC
Start: 2021-10-17 — End: 2021-10-17
  Administered 2021-10-17: 1000 mL via INTRAVENOUS

## 2021-10-17 MED ORDER — ONDANSETRON HCL 4 MG/2ML IJ SOLN
4.0000 mg | Freq: Once | INTRAMUSCULAR | Status: AC
  Administered 2021-10-17: 4 mg via INTRAVENOUS
  Filled 2021-10-17: qty 2

## 2021-10-17 MED ORDER — HYDROCHLOROTHIAZIDE 25 MG PO TABS: 25.0000 mg | ORAL_TABLET | Freq: Every day | ORAL | 2 refills | Status: AC

## 2021-10-17 MED ORDER — POTASSIUM CHLORIDE CRYS ER 20 MEQ PO TBCR
20.0000 meq | EXTENDED_RELEASE_TABLET | Freq: Once | ORAL | Status: AC
  Administered 2021-10-17: 20 meq via ORAL
  Filled 2021-10-17: qty 1

## 2021-10-17 MED ORDER — HYDROCHLOROTHIAZIDE 25 MG PO TABS
25.0000 mg | ORAL_TABLET | Freq: Every day | ORAL | Status: DC
  Administered 2021-10-17: 25 mg via ORAL
  Filled 2021-10-17: qty 1

## 2021-10-17 MED ORDER — ONDANSETRON 4 MG PO TBDP: 4.0000 mg | ORAL_TABLET | Freq: Three times a day (TID) | ORAL | 0 refills | Status: AC | PRN

## 2021-10-17 NOTE — Discharge Instructions (Addendum)
Return if any problems.

## 2021-10-17 NOTE — ED Notes (Signed)
PA made aware of BP. No new orders placed.  ? ?

## 2021-10-17 NOTE — ED Notes (Signed)
Patient will not give a urine spec at this time.  ?

## 2021-10-17 NOTE — ED Triage Notes (Signed)
Patient presents to ed via [facility] c/o nausea and vomiting with body cramps onset yest , states it go worse tonight. Patient was laying in the triage floor,  ?

## 2021-10-17 NOTE — ED Provider Triage Note (Signed)
Emergency Medicine Provider Triage Evaluation Note ? ?Lindsey [name] , a 46 y.o. female  was evaluated in triage.  Pt complains of vomiting and abdominal pain since yesterday. The patient presented to EMS undressed and was very agitated towards staff. Denies any fever, hematochezia, melena, hematemesis, or coffee ground emesis. Patient is agitated in triage. ? ?Review of Systems  ?Positive:  ?Negative:  ? ?Physical Exam  ?BP (!) 184/93   Pulse 89   Temp 99.5 ?F (37.5 ?C)   Resp 17   Ht 5\' 3"  (1.6 m)   Wt 48.5 kg   SpO2 100%   BMI 18.95 kg/m?  ?Gen:   Awake, no distress   ?Resp:  Normal effort  ?MSK:   Moves extremities without difficulty  ?Other:  Mild diffuse abdominal tenderness. No guarding or rebound. ? ?Medical Decision Making  ?Medically screening exam initiated at 4:55 AM.  Appropriate orders placed.  CHOSEN GARRON was informed that the remainder of the evaluation will be completed by another provider, this initial triage assessment does not replace that evaluation, and the importance of remaining in the ED until their evaluation is complete. ? ?Order labs, will hold off on imaging. EKG ordered for possible droperidol ?  ?Lindsey Klinefelter, PA-C ?10/17/21 12/17/21 ? ?

## 2021-10-17 NOTE — ED Provider Notes (Signed)
?[facility] ?Provider Note ? ? ?CSN: [id number] ?Arrival date & time: 10/17/21  0429 ? ?  ? ?History ? ?Chief Complaint  ?Patient presents with  ? Abdominal Pain  ? ? ?Meda KlinefelterFabrienne Y Taff is a 46 y.o. female. ? ?Pt complains of vomiting and abdominal pain.  Pt reports she has been unable to keep down any fluids.  Pt denies diarrhea.  Pt reports she abdomnal pain has improved.   ? ?The history is provided by the patient. No language interpreter was used.  ?Abdominal Pain ?Pain location:  Generalized ?Pain quality: aching   ?Pain radiates to:  Does not radiate ?Pain severity:  Moderate ?Timing:  Constant ?Chronicity:  New ?Relieved by:  Nothing ?Worsened by:  Nothing ?Ineffective treatments:  None tried ?Associated symptoms: vomiting   ?Risk factors: no alcohol abuse   ? ?  ? ?Home Medications ?Prior to Admission medications   ?Medication Sig Start Date End Date Taking? Authorizing Provider  ?cloNIDine (CATAPRES) 0.1 MG tablet Take 1 tablet (0.1 mg total) by mouth daily. ?Patient not taking: Reported on 12/12/2017 02/07/16   Jacalyn LefevreHaviland, Julie, MD  ?hydrochlorothiazide (HYDRODIURIL) 25 MG tablet Take 1 tablet (25 mg total) by mouth daily. ?Patient not taking: Reported on 12/12/2017 01/02/16   Jacalyn LefevreHaviland, Julie, MD  ?hydrochlorothiazide (HYDRODIURIL) 25 MG tablet Take 1 tablet (25 mg total) by mouth daily. ?Patient not taking: Reported on 02/07/2016 02/05/16   Deatra Canterxford, William J, FNP  ?promethazine (PHENERGAN) 25 MG tablet Take 1 tablet (25 mg total) by mouth every 6 (six) hours as needed for nausea or vomiting. 12/12/17   Jacalyn LefevreHaviland, Julie, MD  ?   ? ?Allergies    ?Patient has no known allergies.   ? ?Review of Systems   ?Review of Systems  ?Gastrointestinal:  Positive for abdominal pain and vomiting.  ?All other systems reviewed and are negative. ? ?Physical Exam ?Updated Vital Signs ?BP (!) 184/93   Pulse 89   Temp 99.5 ?F (37.5 ?C)   Resp 17   Ht 5\' 3"  (1.6 m)   Wt 48.5 kg   SpO2 100%    BMI 18.95 kg/m?  ?Physical Exam ?Vitals and nursing note reviewed.  ?Constitutional:   ?   Appearance: She is well-developed.  ?HENT:  ?   Head: Normocephalic.  ?Cardiovascular:  ?   Rate and Rhythm: Normal rate and regular rhythm.  ?Pulmonary:  ?   Effort: Pulmonary effort is normal.  ?Abdominal:  ?   General: Abdomen is flat. Bowel sounds are normal. There is no distension.  ?   Palpations: Abdomen is soft.  ?   Tenderness: There is no abdominal tenderness.  ?Musculoskeletal:     ?   General: Normal range of motion.  ?   Cervical back: Normal range of motion.  ?Neurological:  ?   Mental Status: She is alert and oriented to person, place, and time.  ? ? ?ED Results / Procedures / Treatments   ?Labs ?(all labs ordered are listed, but only abnormal results are displayed) ?Labs Reviewed  ?CBC WITH DIFFERENTIAL/PLATELET - Abnormal; Notable for the following components:  ?    Result Value  ? WBC 11.1 (*)   ? RBC 5.23 (*)   ? Hemoglobin 10.5 (*)   ? HCT 35.4 (*)   ? MCV 67.7 (*)   ? MCH 20.1 (*)   ? MCHC 29.7 (*)   ? RDW 21.3 (*)   ? Neutro Abs 9.7 (*)   ? All other  components within normal limits  ?COMPREHENSIVE METABOLIC PANEL - Abnormal; Notable for the following components:  ? Potassium 3.3 (*)   ? CO2 19 (*)   ? Glucose, Bld 136 (*)   ? All other components within normal limits  ?LIPASE, BLOOD  ?HCG, QUANTITATIVE, PREGNANCY  ?URINALYSIS, ROUTINE W REFLEX MICROSCOPIC  ? ? ?EKG ?None ? ?Radiology ?CT Head Wo Contrast ? ?Result Date: 10/17/2021 ?CLINICAL DATA:  Head trauma EXAM: CT HEAD WITHOUT CONTRAST TECHNIQUE: Contiguous axial images were obtained from the base of the skull through the vertex without intravenous contrast. RADIATION DOSE REDUCTION: This exam was performed according to the departmental dose-optimization program which includes automated exposure control, adjustment of the mA and/or kV according to patient size and/or use of iterative reconstruction technique. COMPARISON:  None Available. FINDINGS:  Brain: No acute intracranial hemorrhage, mass effect, or herniation. No extra-axial fluid collections. No evidence of acute territorial infarct. No hydrocephalus. Vascular: No hyperdense vessel or unexpected calcification. Skull: Normal. Negative for fracture or focal lesion. Sinuses/Orbits: No acute finding. Other: None. IMPRESSION: No acute intracranial process identified. Electronically Signed   By: Jannifer Hick M.D.   On: 10/17/2021 10:48  ? ?CT Maxillofacial Wo Contrast ? ?Result Date: 10/17/2021 ?CLINICAL DATA:  Facial pain EXAM: CT MAXILLOFACIAL WITHOUT CONTRAST TECHNIQUE: Multidetector CT imaging of the maxillofacial structures was performed. Multiplanar CT image reconstructions were also generated. RADIATION DOSE REDUCTION: This exam was performed according to the departmental dose-optimization program which includes automated exposure control, adjustment of the mA and/or kV according to patient size and/or use of iterative reconstruction technique. COMPARISON:  None Available. FINDINGS: Osseous: No fracture or mandibular dislocation. No destructive process. Note is made of advanced facet arthropathy on the left at C2-C3. Orbits: Negative. No traumatic or inflammatory finding. Sinuses: No acute process identified. Soft tissues: Negative. Limited intracranial: No significant or unexpected finding. IMPRESSION: No acute process identified. Electronically Signed   By: Jannifer Hick M.D.   On: 10/17/2021 10:53   ? ?Procedures ?Procedures  ? ? ?Medications Ordered in ED ?Medications  ?acetaminophen (TYLENOL) tablet 650 mg (has no administration in time range)  ?ondansetron (ZOFRAN-ODT) disintegrating tablet 4 mg (4 mg Oral Given 10/17/21 0500)  ?sodium chloride 0.9 % bolus 1,000 mL (1,000 mLs Intravenous New Bag/Given 10/17/21 0917)  ?ondansetron [facility]) injection 4 mg (4 mg Intravenous Given 10/17/21 0916)  ?potassium chloride SA (KLOR-CON M) CR tablet 20 mEq (20 mEq Oral Given 10/17/21 0917)  ? ? ?ED  Course/ Medical Decision Making/ A&P ?  ?                        ?Medical Decision Making ?Pt complains of vomiting and abdominal cramping  ? ?Amount and/or Complexity of Data Reviewed ?Independent Historian: spouse ?Labs: ordered. Decision-making details documented in ED Course. ?   Details: Pt has an elevated wbc count of 11.1 potasium is 3.3 ?Discussion of management or test interpretation with external provider(s): Pt given IV NS x 1 liter, zofran iv.  Pt able to tolerate sips of fluid.  Pt vomited after being given potassium po.  Pt  blood pressure increased while in ED.  Pt is out of hctz.  Pt given rx for hctz and zofran  ? ?Risk ?OTC drugs. ?Prescription drug management. ? ? ? ? ? ? ? ? ? ? ?Final Clinical Impression(s) / ED Diagnoses ?Final diagnoses:  ?Generalized abdominal pain  ? ? ?Rx / DC Orders ?ED Discharge Orders   ? ?  Ordered  ?  ondansetron (ZOFRAN-ODT) 4 MG disintegrating tablet  Every 8 hours PRN,   Status:  Discontinued       ? 10/17/21 1121  ?  hydrochlorothiazide (HYDRODIURIL) 25 MG tablet  Daily       ? 10/17/21 1216  ?  ondansetron (ZOFRAN-ODT) 4 MG disintegrating tablet  Every 8 hours PRN       ? 10/17/21 1216  ? ?  ?  ? ?  ? ? ?  ?Elson Areas, [place] ?10/17/21 1217 ? ?  ?Rozelle Logan, DO ?10/17/21 1223 ? ?

## 2021-10-18 ENCOUNTER — Other Ambulatory Visit: Payer: Self-pay

## 2021-10-18 ENCOUNTER — Emergency Department (HOSPITAL_COMMUNITY): Payer: Self-pay

## 2021-10-18 ENCOUNTER — Encounter (HOSPITAL_COMMUNITY): Payer: Self-pay

## 2021-10-18 ENCOUNTER — Emergency Department (HOSPITAL_COMMUNITY)
Admission: EM | Admit: 2021-10-18 | Discharge: 2021-10-18 | Disposition: A | Discharge status: Home / Self Care | Payer: Self-pay | Attending: Emergency Medicine | Admitting: Emergency Medicine

## 2021-10-18 DIAGNOSIS — F1410 Cocaine abuse, uncomplicated: Secondary | ICD-10-CM | POA: Insufficient documentation

## 2021-10-18 DIAGNOSIS — N739 Female pelvic inflammatory disease, unspecified: Secondary | ICD-10-CM

## 2021-10-18 DIAGNOSIS — N738 Other specified female pelvic inflammatory diseases: Secondary | ICD-10-CM | POA: Insufficient documentation

## 2021-10-18 DIAGNOSIS — E876 Hypokalemia: Secondary | ICD-10-CM | POA: Insufficient documentation

## 2021-10-18 LAB — BASIC METABOLIC PANEL
Anion gap: 14 (ref 5–15)
BUN: 27 mg/dL — ABNORMAL HIGH (ref 6–20)
CO2: 21 mmol/L — ABNORMAL LOW (ref 22–32)
Calcium: 9.6 mg/dL (ref 8.9–10.3)
Chloride: 103 mmol/L (ref 98–111)
Creatinine, Ser: 1.84 mg/dL — ABNORMAL HIGH (ref 0.44–1.00)
GFR, Estimated: 34 mL/min — ABNORMAL LOW (ref >60)
Glucose, Bld: 106 mg/dL — ABNORMAL HIGH (ref 70–99)
Potassium: 3.3 mmol/L — ABNORMAL LOW (ref 3.5–5.1)
Sodium: 138 mmol/L (ref 135–145)

## 2021-10-18 LAB — TROPONIN I (HIGH SENSITIVITY)
Troponin I (High Sensitivity): 18 ng/L — ABNORMAL HIGH (ref <18)
Troponin I (High Sensitivity): 20 ng/L — ABNORMAL HIGH (ref <18)

## 2021-10-18 LAB — WET PREP, GENITAL
Sperm: NONE SEEN
Trich, Wet Prep: NONE SEEN
WBC, Wet Prep HPF POC: >=10 — AB (ref <10)
Yeast Wet Prep HPF POC: NONE SEEN

## 2021-10-18 LAB — CBC
HCT: 37.4 % (ref 36.0–46.0)
Hemoglobin: 10.7 g/dL — ABNORMAL LOW (ref 12.0–15.0)
MCH: 19.3 pg — ABNORMAL LOW (ref 26.0–34.0)
MCHC: 28.6 g/dL — ABNORMAL LOW (ref 30.0–36.0)
MCV: 67.5 fL — ABNORMAL LOW (ref 80.0–100.0)
Platelets: 411 10*3/uL — ABNORMAL HIGH (ref 150–400)
RBC: 5.54 MIL/uL — ABNORMAL HIGH (ref 3.87–5.11)
RDW: 21.4 % — ABNORMAL HIGH (ref 11.5–15.5)
WBC: 8.8 10*3/uL (ref 4.0–10.5)
nRBC: 0 % (ref 0.0–0.2)

## 2021-10-18 LAB — RAPID URINE DRUG SCREEN, HOSP PERFORMED
Amphetamines: NOT DETECTED
Barbiturates: NOT DETECTED
Benzodiazepines: NOT DETECTED
Cocaine: POSITIVE — AB
Opiates: NOT DETECTED
Tetrahydrocannabinol: POSITIVE — AB

## 2021-10-18 LAB — I-STAT BETA HCG BLOOD, ED (MC, WL, AP ONLY): I-stat hCG, quantitative: <5 m[IU]/mL (ref <5)

## 2021-10-18 MED ORDER — HALOPERIDOL LACTATE 5 MG/ML IJ SOLN
5.0000 mg | Freq: Once | INTRAMUSCULAR | Status: AC
Start: 2021-10-18 — End: 2021-10-18
  Administered 2021-10-18: 5 mg via INTRAVENOUS
  Filled 2021-10-18: qty 1

## 2021-10-18 MED ORDER — DOXYCYCLINE HYCLATE 100 MG PO CAPS: 100.0000 mg | ORAL_CAPSULE | Freq: Two times a day (BID) | ORAL | 0 refills | Status: AC

## 2021-10-18 MED ORDER — POTASSIUM CHLORIDE CRYS ER 20 MEQ PO TBCR
40.0000 meq | EXTENDED_RELEASE_TABLET | Freq: Once | ORAL | Status: AC
  Administered 2021-10-18: 40 meq via ORAL
  Filled 2021-10-18: qty 2

## 2021-10-18 MED ORDER — HYDROCODONE-ACETAMINOPHEN 5-325 MG PO TABS: 1.0000 | ORAL_TABLET | ORAL | 0 refills | Status: AC | PRN

## 2021-10-18 MED ORDER — ONDANSETRON HCL 4 MG/2ML IJ SOLN
4.0000 mg | Freq: Once | INTRAMUSCULAR | Status: AC
  Administered 2021-10-18: 4 mg via INTRAVENOUS
  Filled 2021-10-18: qty 2

## 2021-10-18 MED ORDER — CEFTRIAXONE SODIUM 500 MG IJ SOLR
500.0000 mg | Freq: Once | INTRAMUSCULAR | Status: AC
  Administered 2021-10-18: 500 mg via INTRAMUSCULAR
  Filled 2021-10-18: qty 500

## 2021-10-18 MED ORDER — DOXYCYCLINE HYCLATE 100 MG PO TABS
100.0000 mg | ORAL_TABLET | Freq: Once | ORAL | Status: AC
  Administered 2021-10-18: 100 mg via ORAL
  Filled 2021-10-18: qty 1

## 2021-10-18 MED ORDER — IOHEXOL 300 MG/ML  SOLN
75.0000 mL | Freq: Once | INTRAMUSCULAR | Status: AC | PRN
  Administered 2021-10-18: 75 mL via INTRAVENOUS

## 2021-10-18 MED ORDER — POTASSIUM CHLORIDE CRYS ER 20 MEQ PO TBCR: 20.0000 meq | EXTENDED_RELEASE_TABLET | Freq: Every day | ORAL | 0 refills | Status: AC

## 2021-10-18 MED ORDER — HYDROCODONE-ACETAMINOPHEN 5-325 MG PO TABS
1.0000 | ORAL_TABLET | Freq: Once | ORAL | Status: AC
  Administered 2021-10-18: 1 via ORAL
  Filled 2021-10-18: qty 1

## 2021-10-18 MED ORDER — SODIUM CHLORIDE 0.9 % IV BOLUS
500.0000 mL | Freq: Once | INTRAVENOUS | Status: AC
  Administered 2021-10-18: 500 mL via INTRAVENOUS

## 2021-10-18 NOTE — ED Triage Notes (Addendum)
Pt arrived via [facility] from home for N/Vx3-4 days. Pt was here for same yesterday tx and D/C'd. Pt able to ambulate, walked into ambulance. Per EMS, VSS. Pt c/o non radiating mid sternal chest pain and states "it hurts to breath." ?

## 2021-10-18 NOTE — ED Provider Notes (Signed)
?[facility] ?Provider Note ? ? ?CSN: [id number] ?Arrival date & time: 10/18/21  1100 ? ?  ? ?History ? ?Chief Complaint  ?Patient presents with  ? Emesis  ? ? ?Lindsey [name] is a 46 y.o. female. ? ?HPI ?Patient presenting for evaluation of ongoing vomiting and abdominal pain, after ED evaluation yesterday.  She cannot recall what her instructions were at discharge or if any medications were given, for home use.  Discharge diagnosis was generalized abdominal pain and hypertension.  A prescription was sent to her pharmacy.  The patient had imaging, CT head and CT maxillofacial done, for unclear reason.  She denies having this problem previously.  She is unable to give any additional history. ?  ? ?Home Medications ?Prior to Admission medications   ?Medication Sig Start Date End Date Taking? Authorizing Provider  ?doxycycline (VIBRAMYCIN) 100 MG capsule Take 1 capsule (100 mg total) by mouth 2 (two) times daily. One po bid x 7 days 10/18/21  Yes Mancel BaleWentz, Cady Hafen, MD  ?HYDROcodone-acetaminophen (NORCO/VICODIN) 5-325 MG tablet Take 1 tablet by mouth every 4 (four) hours as needed for moderate pain. 10/18/21  Yes Mancel BaleWentz, Santita Hunsberger, MD  ?potassium chloride SA (KLOR-CON M) 20 MEQ tablet Take 1 tablet (20 mEq total) by mouth daily. 10/18/21  Yes Mancel BaleWentz, Danaisha Celli, MD  ?hydrochlorothiazide (HYDRODIURIL) 25 MG tablet Take 1 tablet (25 mg total) by mouth daily. 10/17/21   Elson AreasSofia, Leslie K, PA-C  ?ondansetron (ZOFRAN-ODT) 4 MG disintegrating tablet Take 1 tablet (4 mg total) by mouth every 8 (eight) hours as needed for nausea or vomiting. 10/17/21   Elson AreasSofia, Leslie K, PA-C  ?promethazine (PHENERGAN) 25 MG tablet Take 1 tablet (25 mg total) by mouth every 6 (six) hours as needed for nausea or vomiting. 12/12/17   Jacalyn LefevreHaviland, Julie, MD  ?   ? ?Allergies    ?Patient has no known allergies.   ? ?Review of Systems   ?Review of Systems ? ?Physical Exam ?Updated Vital Signs ?BP (!) 194/126   Pulse 94   Temp  98.7 ?F (37.1 ?C) (Oral)   Resp 14   Ht 5\' 3"  (1.6 m)   Wt 48.5 kg   SpO2 97%   BMI 18.94 kg/m?  ?Physical Exam ?Vitals and nursing note reviewed.  ?Constitutional:   ?   General: She is in acute distress (Restless, moving continuously, getting up and down off of the examination chair.).  ?   Appearance: She is well-developed. She is ill-appearing.  ?   Comments: Slender, under nourished appearing  ?HENT:  ?   Head: Normocephalic and atraumatic.  ?   Right Ear: External ear normal.  ?   Left Ear: External ear normal.  ?   Mouth/Throat:  ?   Mouth: Mucous membranes are moist.  ?Eyes:  ?   Conjunctiva/sclera: Conjunctivae normal.  ?   Pupils: Pupils are equal, round, and reactive to light.  ?Neck:  ?   Trachea: Phonation normal.  ?Cardiovascular:  ?   Rate and Rhythm: Normal rate and regular rhythm.  ?   Heart sounds: Normal heart sounds.  ?Pulmonary:  ?   Effort: Pulmonary effort is normal. No respiratory distress.  ?   Breath sounds: Normal breath sounds. No stridor.  ?Abdominal:  ?   General: There is no distension.  ?   Palpations: Abdomen is soft.  ?   Tenderness: There is abdominal tenderness (Use, moderate).  ?Musculoskeletal:     ?   General: Normal range of  motion.  ?   Cervical back: Normal range of motion and neck supple.  ?Skin: ?   General: Skin is warm and dry.  ?Neurological:  ?   Mental Status: She is alert and oriented to person, place, and time.  ?   Cranial Nerves: No cranial nerve deficit.  ?   Sensory: No sensory deficit.  ?   Motor: No abnormal muscle tone.  ?   Coordination: Coordination normal.  ?Psychiatric:     ?   Attention and Perception: She is inattentive.     ?   Mood and Affect: Mood is anxious. Affect is labile.     ?   Behavior: Behavior is agitated.     ?   Thought Content: Thought content normal. Thought content is not delusional.     ?   Cognition and Memory: Cognition is impaired.     ?   Judgment: Judgment is impulsive and inappropriate.  ? ? ?ED Results / Procedures /  Treatments   ?Labs ?(all labs ordered are listed, but only abnormal results are displayed) ?Labs Reviewed  ?WET PREP, GENITAL - Abnormal; Notable for the following components:  ?    Result Value  ? Clue Cells Wet Prep HPF POC PRESENT (*)   ? WBC, Wet Prep HPF POC >=10 (*)   ? All other components within normal limits  ?BASIC METABOLIC PANEL - Abnormal; Notable for the following components:  ? Potassium 3.3 (*)   ? CO2 21 (*)   ? Glucose, Bld 106 (*)   ? BUN 27 (*)   ? Creatinine, Ser 1.84 (*)   ? GFR, Estimated 34 (*)   ? All other components within normal limits  ?CBC - Abnormal; Notable for the following components:  ? RBC 5.54 (*)   ? Hemoglobin 10.7 (*)   ? MCV 67.5 (*)   ? MCH 19.3 (*)   ? MCHC 28.6 (*)   ? RDW 21.4 (*)   ? Platelets 411 (*)   ? All other components within normal limits  ?RAPID URINE DRUG SCREEN, HOSP PERFORMED - Abnormal; Notable for the following components:  ? Cocaine POSITIVE (*)   ? Tetrahydrocannabinol POSITIVE (*)   ? All other components within normal limits  ?TROPONIN I (HIGH SENSITIVITY) - Abnormal; Notable for the following components:  ? Troponin I (High Sensitivity) 18 (*)   ? All other components within normal limits  ?TROPONIN I (HIGH SENSITIVITY) - Abnormal; Notable for the following components:  ? Troponin I (High Sensitivity) 20 (*)   ? All other components within normal limits  ?RPR  ?HIV ANTIBODY (ROUTINE TESTING W REFLEX)  ?I-STAT BETA HCG BLOOD, ED ([facility], [facility], [facility] ONLY)  ?GC/CHLAMYDIA PROBE AMP ([place]) NOT AT [facility]  ? ? ?EKG ?EKG Interpretation ? ?Date/Time:  Friday Oct 18 2021 11:17:48 EDT ?Ventricular Rate:  89 ?PR Interval:  128 ?QRS Duration: 90 ?QT Interval:  394 ?QTC Calculation: 479 ?R Axis:   20 ?Text Interpretation: Normal sinus rhythm Biatrial enlargement Left ventricular hypertrophy ( Sokolow-Lyon , Cornell product ) Nonspecific ST and T wave abnormality Abnormal ECG When compared with ECG of 10-Apr-2017 21:04, PREVIOUS ECG IS PRESENT Since last tracing QT has  shortened Confirmed by Mancel Bale [phone]) on 10/18/2021 10:35:12 PM ? ?Radiology ?DG Chest 2 View ? ?Result Date: 10/18/2021 ?CLINICAL DATA:  Shortness of breath EXAM: CHEST - 2 VIEW COMPARISON:  Radiograph 04/11/2027 FINDINGS: Unchanged cardiomediastinal silhouette. There is no focal airspace consolidation. There is no pleural  effusion. No pneumothorax. There is no acute osseous abnormality. IMPRESSION: No evidence of acute cardiopulmonary disease. Electronically Signed   By: Caprice Renshaw M.D.   On: 10/18/2021 11:40  ? ?CT Head Wo Contrast ? ?Result Date: 10/17/2021 ?CLINICAL DATA:  Head trauma EXAM: CT HEAD WITHOUT CONTRAST TECHNIQUE: Contiguous axial images were obtained from the base of the skull through the vertex without intravenous contrast. RADIATION DOSE REDUCTION: This exam was performed according to the departmental dose-optimization program which includes automated exposure control, adjustment of the mA and/or kV according to patient size and/or use of iterative reconstruction technique. COMPARISON:  None Available. FINDINGS: Brain: No acute intracranial hemorrhage, mass effect, or herniation. No extra-axial fluid collections. No evidence of acute territorial infarct. No hydrocephalus. Vascular: No hyperdense vessel or unexpected calcification. Skull: Normal. Negative for fracture or focal lesion. Sinuses/Orbits: No acute finding. Other: None. IMPRESSION: No acute intracranial process identified. Electronically Signed   By: Jannifer Hick M.D.   On: 10/17/2021 10:48  ? ?CT Abdomen Pelvis W Contrast ? ?Result Date: 10/18/2021 ?CLINICAL DATA:  Abdominal pain with nausea and vomiting. EXAM: CT ABDOMEN AND PELVIS WITH CONTRAST TECHNIQUE: Multidetector CT imaging of the abdomen and pelvis was performed using the standard protocol following bolus administration of intravenous contrast. RADIATION DOSE REDUCTION: This exam was performed according to the departmental dose-optimization program which includes  automated exposure control, adjustment of the mA and/or kV according to patient size and/or use of iterative reconstruction technique. CONTRAST:  105mL OMNIPAQUE IOHEXOL 300 MG/ML  SOLN COMPARISON:  12/12/2017 FINDINGS

## 2021-10-18 NOTE — ED Notes (Signed)
RN notified Lindsey Shy, MD about BP. MD suggest to keep eye on BP. RN will continue to monitor.  ?

## 2021-10-18 NOTE — Discharge Instructions (Signed)
The abdominal pain you are having is likely due to an STD like gonorrhea or chlamydia.  We are treating you for both.  Get the prescriptions filled from the pharmacy that were sent.  Your potassium level was low because you take a diuretic medication.  Try to eat foods which contain more potassium as indicated on the attached paperwork.  Avoid using stimulant medications like cocaine because that can worsen abdominal pain.  Follow-up with your doctor in 1 week for checkup and repeat potassium testing. ?

## 2021-10-19 LAB — RPR: RPR Ser Ql: NONREACTIVE

## 2021-10-19 LAB — HIV ANTIBODY (ROUTINE TESTING W REFLEX): HIV Screen 4th Generation wRfx: NONREACTIVE

## 2021-10-21 LAB — GC/CHLAMYDIA PROBE AMP (~~LOC~~) NOT AT ARMC
Chlamydia: NEGATIVE
Comment: NEGATIVE
Comment: NORMAL
Neisseria Gonorrhea: NEGATIVE

## 2023-04-29 ENCOUNTER — Ambulatory Visit: Payer: Medicaid Other | Admitting: Internal Medicine

## 2024-01-17 IMAGING — CT CT ABD-PELV W/ CM
2 of 4 series · 14 of 46 positions shown, 16 images · IV contrast (Omni 300)
Comparison: 12/12/2017

CLINICAL DATA: Abdominal pain with nausea and vomiting.

EXAM:
CT ABDOMEN AND PELVIS WITH CONTRAST
TECHNIQUE: Multidetector CT imaging of the abdomen and pelvis was performed
using the standard protocol following bolus administration of
intravenous contrast.

[Series 3: a/p w/ 5mm · axial · 0.60mm/px · z∈[+663,+1013]mm · 11 of 84 slices shown, 13 images]
[im 7/84  soft-tissue]
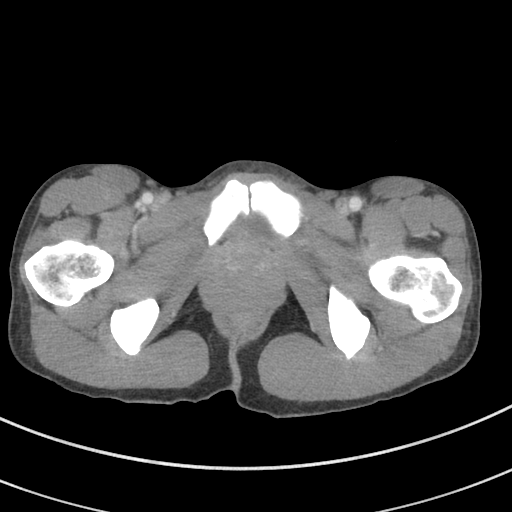
[im 7/84  bone]
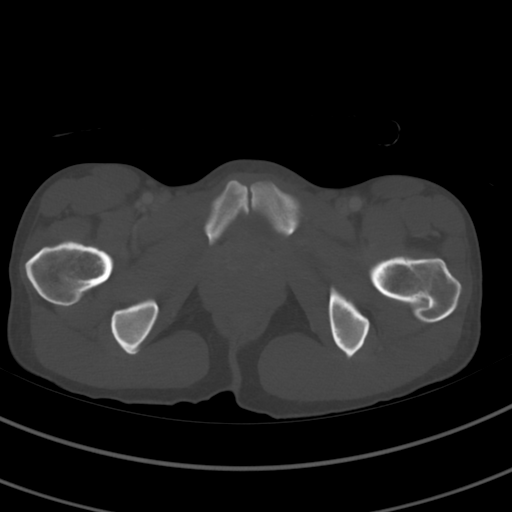
[im 14/84  soft-tissue]
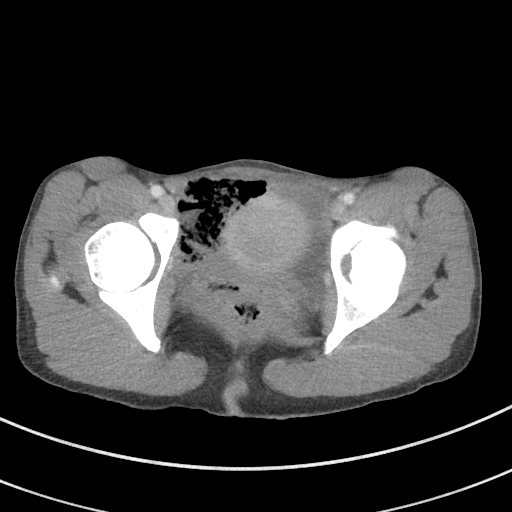
[im 21/84  soft-tissue]
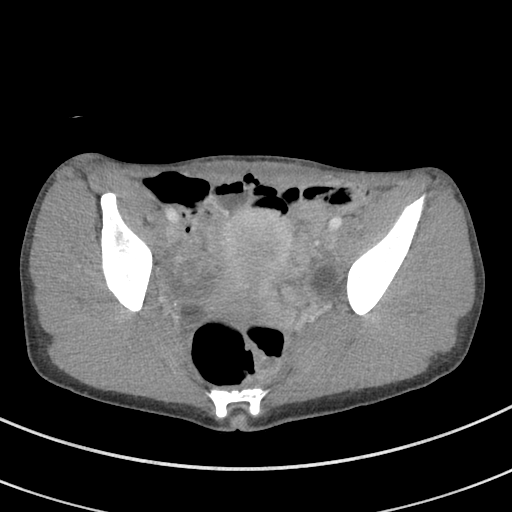
[im 28/84  soft-tissue]
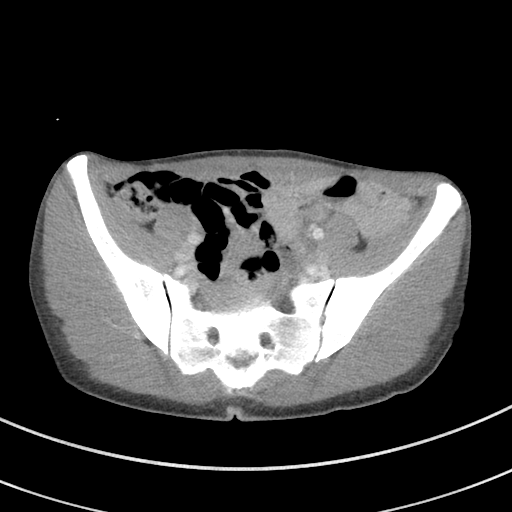
[im 35/84  soft-tissue]
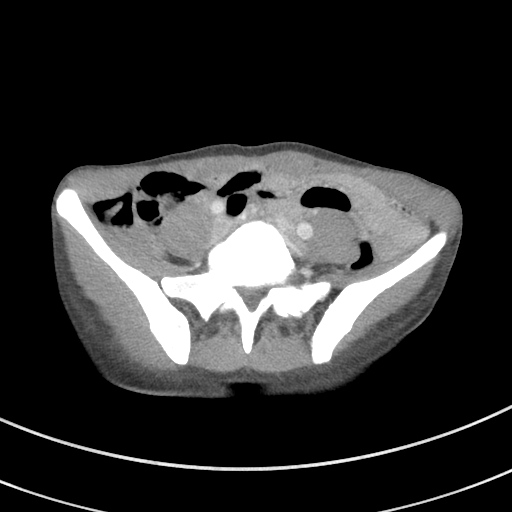
[im 42/84  soft-tissue]
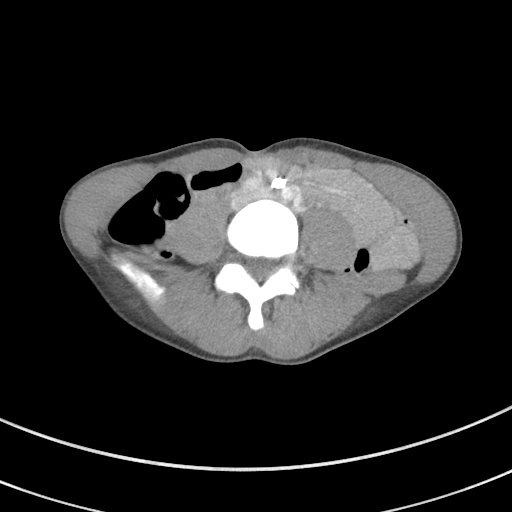
[im 49/84  soft-tissue]
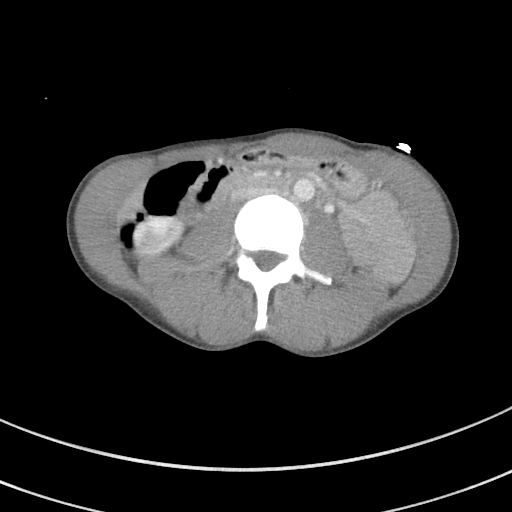
[im 56/84  soft-tissue]
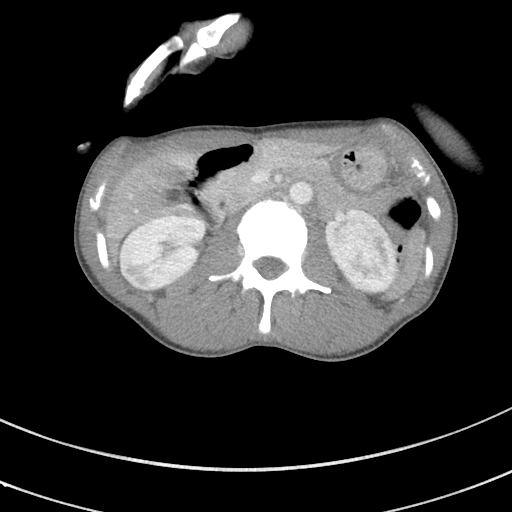
[im 63/84  soft-tissue]
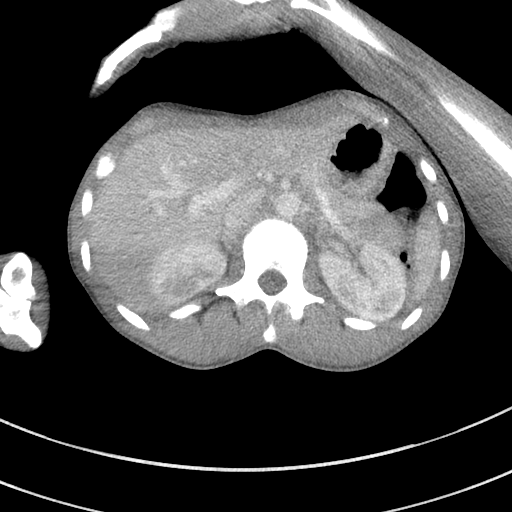
[im 63/84  bone]
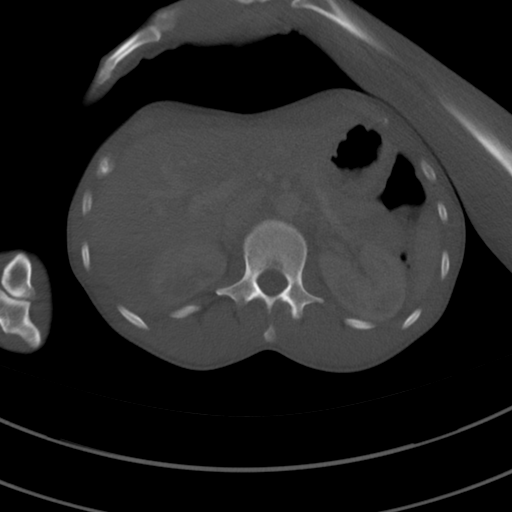
[im 70/84  soft-tissue]
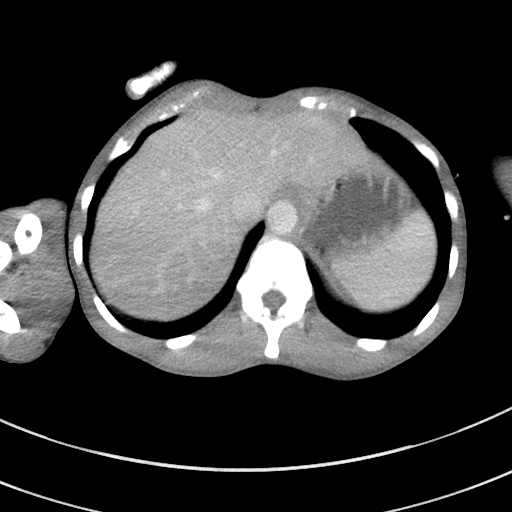
[im 77/84  soft-tissue]
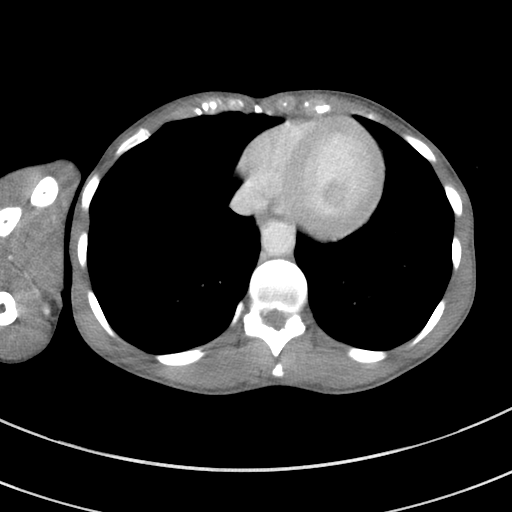

[Series 6: a/p w/ cor · coronal · 0.54mm/px · 3 of 110 slices shown]
[im 37/110  soft-tissue]
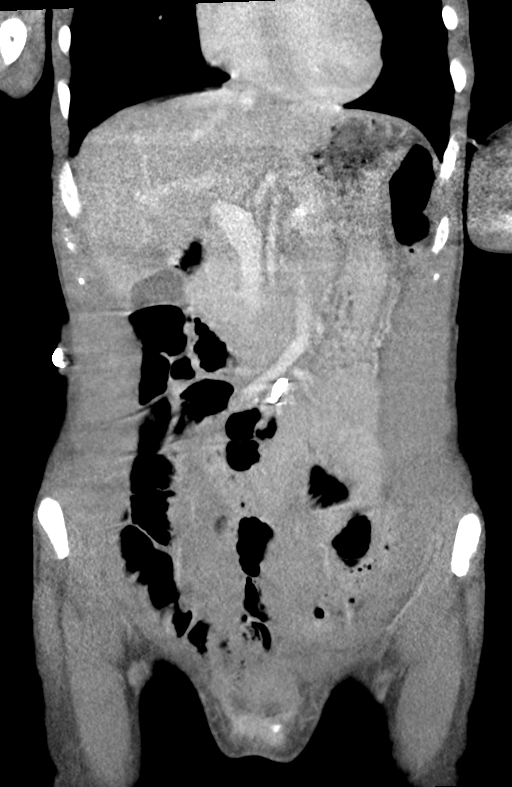
[im 49/110  soft-tissue]
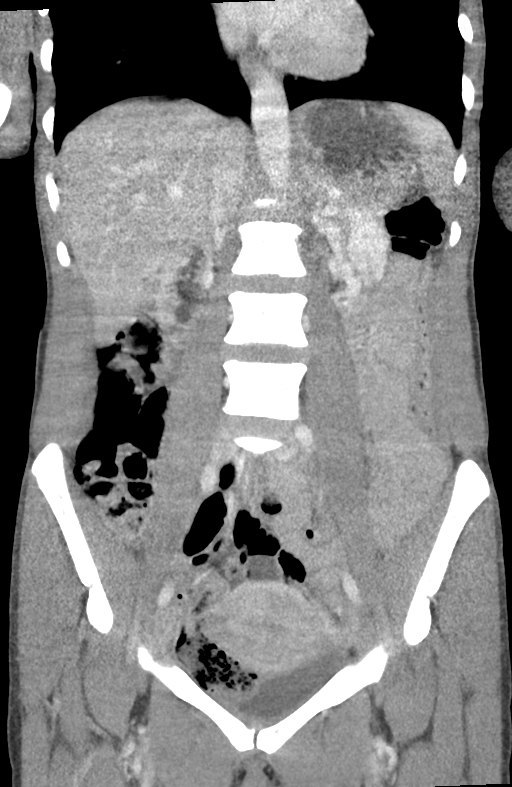
[im 61/110  soft-tissue]
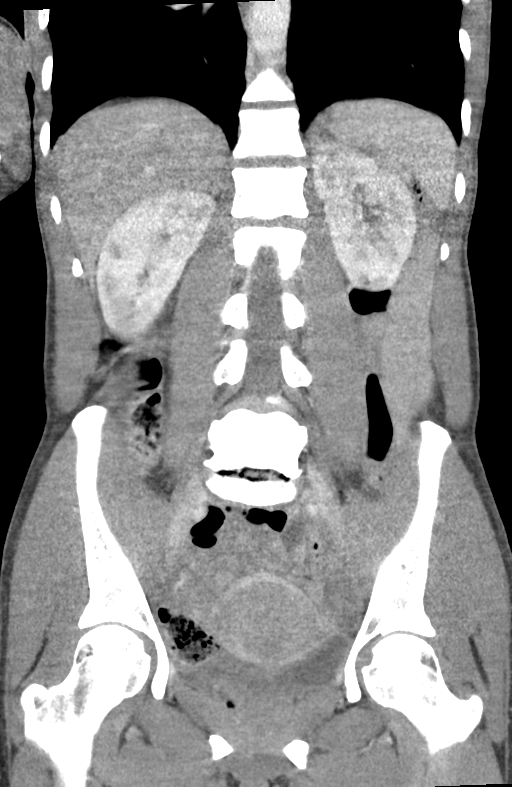

[14 of 46 positions shown; findings below may reference images not displayed]

RADIATION DOSE REDUCTION: This exam was performed according to the
departmental dose-optimization program which includes automated
exposure control, adjustment of the mA and/or kV according to
patient size and/or use of iterative reconstruction technique.

CONTRAST:  75mL OMNIPAQUE IOHEXOL 300 MG/ML  SOLN
FINDINGS: Lower chest: Unremarkable.

Hepatobiliary: No suspicious focal abnormality within the liver
parenchyma. There is no evidence for gallstones, gallbladder wall
thickening, or pericholecystic fluid. No intrahepatic or
extrahepatic biliary dilation.

Pancreas: No focal mass lesion. No dilatation of the main duct. No
intraparenchymal cyst. No peripancreatic edema.

Spleen: No splenomegaly. No focal mass lesion.

Adrenals/Urinary Tract: No adrenal nodule or mass. Areas of apparent
segmental hypoperfusion are identified in the kidneys bilaterally
(right upper pole on image [DATE] and upper interpolar right kidney on
[DATE]. Similar areas of hypoperfusion in the lower posterior left
kidney ([DATE] and well demonstrated on renal delay image 12 series
8). No hydronephrosis. No hydroureter. The urinary bladder appears
normal for the degree of distention.

Stomach/Bowel: Stomach is unremarkable. No gastric wall thickening.
No evidence of outlet obstruction. Assessment small bowel is limited
by lack of intra-abdominal fat. No evidence for small bowel
obstruction. Neither the terminal ileum nor the appendix are
discretely visible colon is nondilated without evidence for wall
thickening.

Vascular/Lymphatic: No abdominal aortic aneurysm. Surgical clip
noted in the region of the aortic bifurcation. There is no
gastrohepatic or hepatoduodenal ligament lymphadenopathy. No
retroperitoneal or mesenteric lymphadenopathy. No pelvic sidewall
lymphadenopathy.

Reproductive: Dominant follicles noted both ovaries. Uterus
unremarkable. Collections of fluid gas and debris are identified in
the posterior pelvis bilaterally (image 67/3 and well seen on
coronal image 88/6) which appear to be high in the vaginal vault, in
the vaginal fornices bilaterally.

Other: No substantial intraperitoneal free fluid.

Musculoskeletal: No worrisome lytic or sclerotic osseous
abnormality.
IMPRESSION: 1. Study limited by lack of intra-abdominal fat and lack of enteric
contrast.
2. Areas of segmental hypoperfusion in the kidneys bilaterally.
Imaging features suggest pyelonephritis.
3. Collections of fluid, gas and debris are identified in the
posterior pelvis bilaterally felt to be most likely high in the
vaginal vault, at the vaginal fornices bilaterally. This could be
confirmed clinically.
4. If patient's symptoms persist or worsen, consider repeat CT
imaging to include oral and enteric contrast.

## 2024-01-17 IMAGING — CR DG CHEST 2V
2 series · 2 of 2 positions shown · non-contrast
Comparison: Radiograph 04/11/2027

CLINICAL DATA: Shortness of breath

EXAM:
CHEST - 2 VIEW

[chest lat]
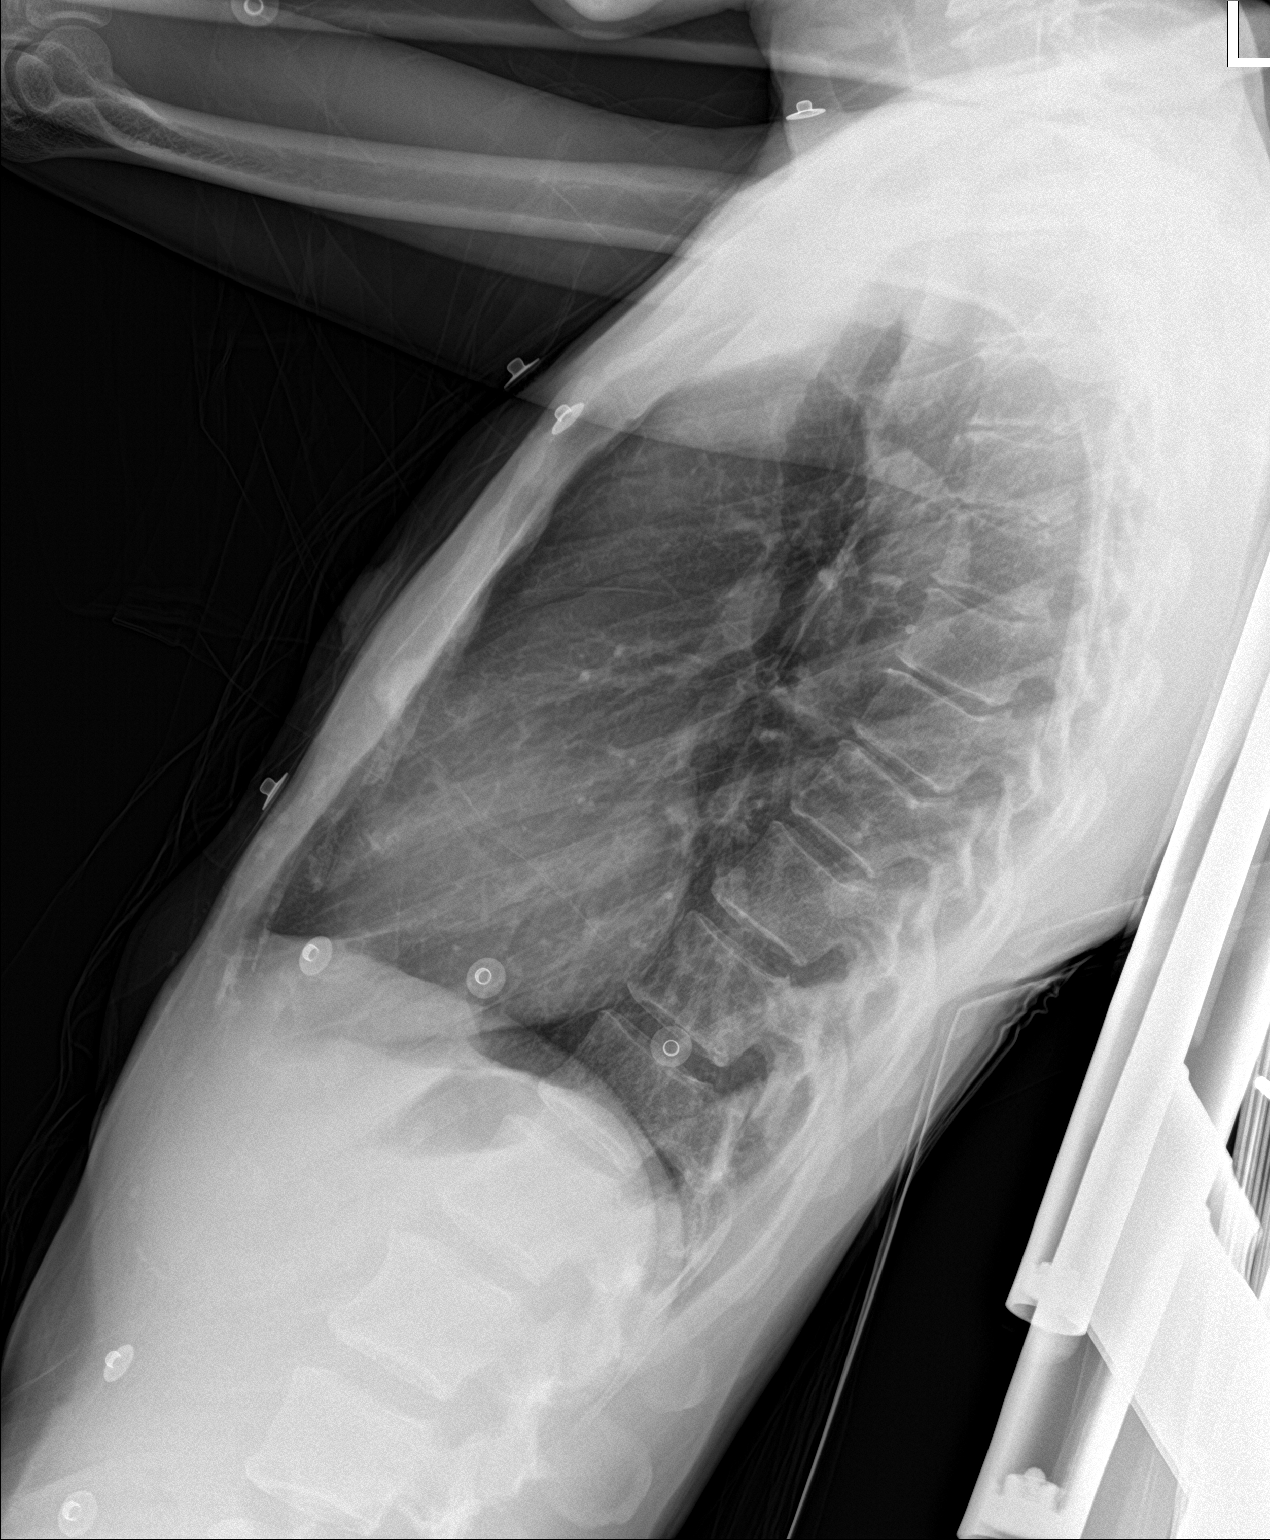

[chest ap]
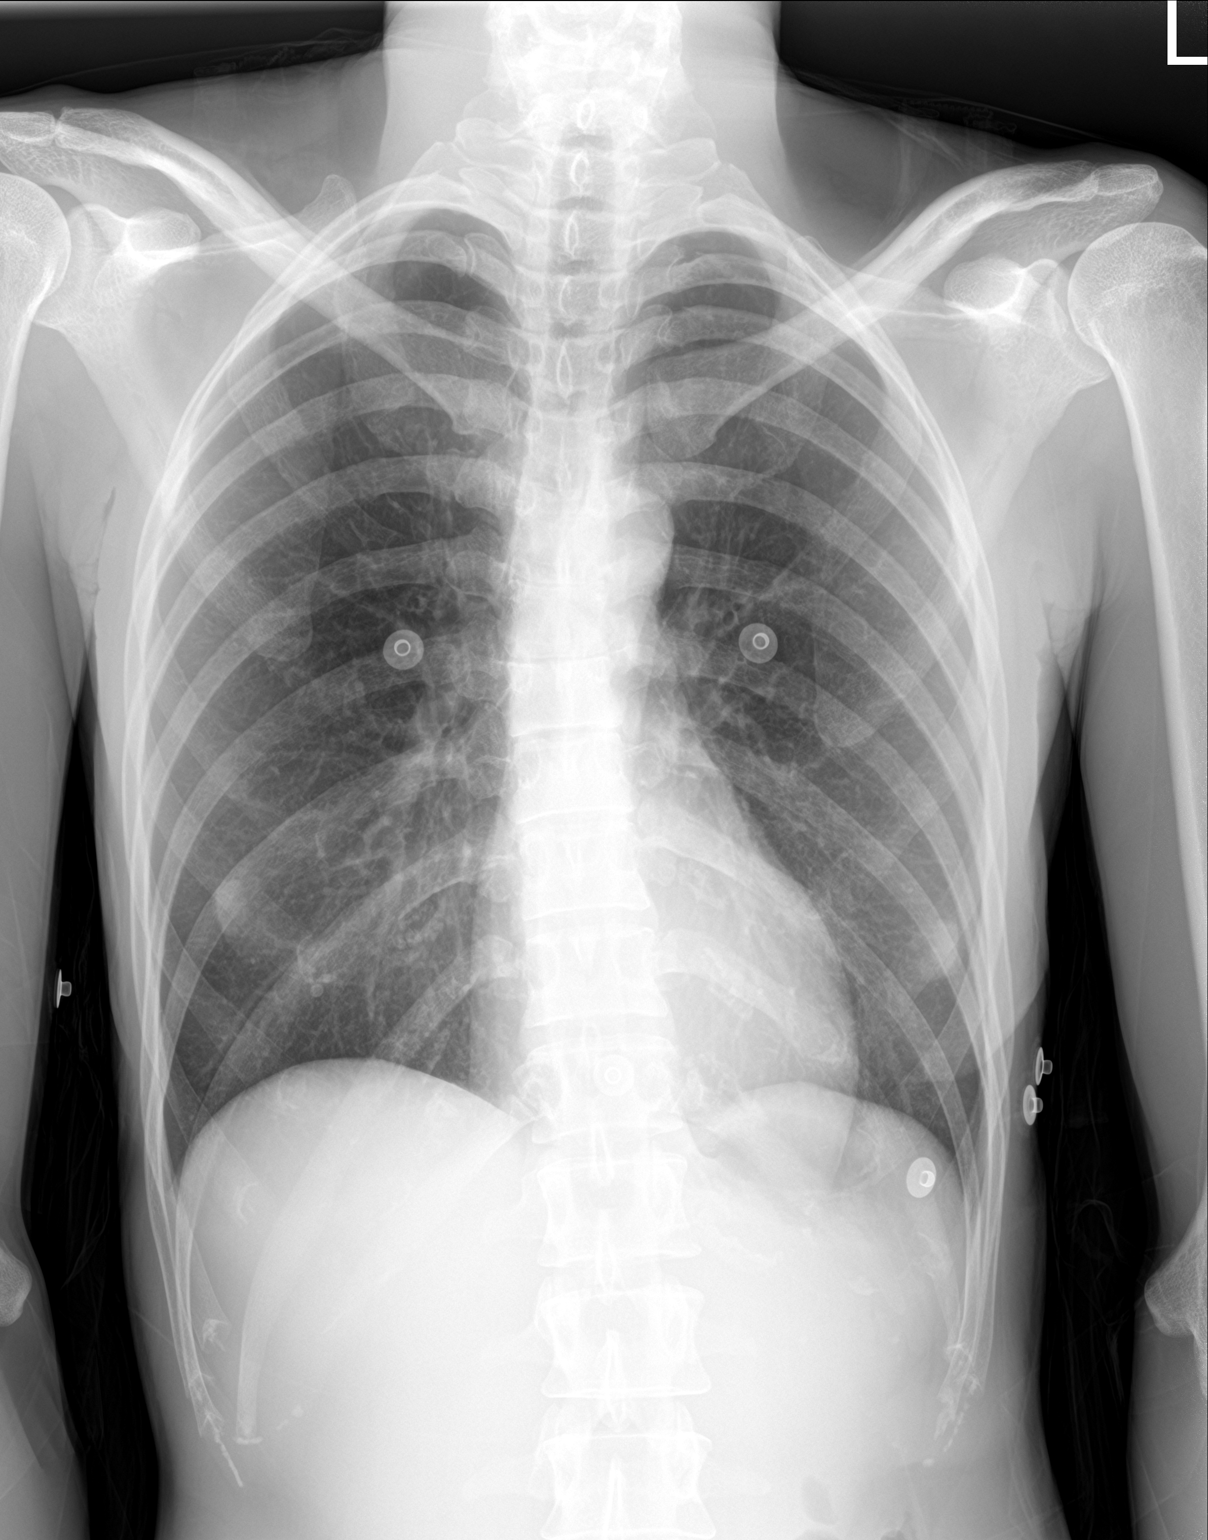

[2 of 2 positions shown; findings below may reference images not displayed]

FINDINGS: Unchanged cardiomediastinal silhouette. There is no focal airspace
consolidation. There is no pleural effusion. No pneumothorax. There
is no acute osseous abnormality.
IMPRESSION: No evidence of acute cardiopulmonary disease.
